# Patient Record
Sex: Male | Born: 1980 | Race: Black or African American | Hispanic: No | Marital: Married | Smoking: Never smoker
Health system: Southern US, Community
[De-identification: ages and names within clinical notes are randomized; demographics above are authoritative.]

## PROBLEM LIST (undated history)

## (undated) DIAGNOSIS — K759 Inflammatory liver disease, unspecified: Secondary | ICD-10-CM

## (undated) DIAGNOSIS — K589 Irritable bowel syndrome without diarrhea: Secondary | ICD-10-CM

## (undated) DIAGNOSIS — K219 Gastro-esophageal reflux disease without esophagitis: Secondary | ICD-10-CM

## (undated) DIAGNOSIS — M722 Plantar fascial fibromatosis: Secondary | ICD-10-CM

## (undated) DIAGNOSIS — I890 Lymphedema, not elsewhere classified: Secondary | ICD-10-CM

## (undated) DIAGNOSIS — A159 Respiratory tuberculosis unspecified: Secondary | ICD-10-CM

## (undated) DIAGNOSIS — F419 Anxiety disorder, unspecified: Secondary | ICD-10-CM

## (undated) DIAGNOSIS — N529 Male erectile dysfunction, unspecified: Secondary | ICD-10-CM

## (undated) DIAGNOSIS — B353 Tinea pedis: Secondary | ICD-10-CM

## (undated) DIAGNOSIS — F459 Somatoform disorder, unspecified: Secondary | ICD-10-CM

## (undated) DIAGNOSIS — R51 Headache: Secondary | ICD-10-CM

## (undated) HISTORY — DX: Lymphedema, not elsewhere classified: I89.0

## (undated) HISTORY — DX: Gastro-esophageal reflux disease without esophagitis: K21.9

## (undated) HISTORY — PX: OTHER SURGICAL HISTORY: SHX169

## (undated) HISTORY — DX: Plantar fascial fibromatosis: M72.2

## (undated) HISTORY — PX: TONSILLECTOMY: SUR1361

## (undated) HISTORY — DX: Anxiety disorder, unspecified: F41.9

## (undated) HISTORY — DX: Headache: R51

## (undated) HISTORY — DX: Irritable bowel syndrome, unspecified: K58.9

## (undated) HISTORY — DX: Respiratory tuberculosis unspecified: A15.9

## (undated) HISTORY — DX: Inflammatory liver disease, unspecified: K75.9

## (undated) HISTORY — DX: Male erectile dysfunction, unspecified: N52.9

## (undated) HISTORY — DX: Tinea pedis: B35.3

## (undated) HISTORY — DX: Somatoform disorder, unspecified: F45.9

---
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
## 2000-04-17 ENCOUNTER — Ambulatory Visit (HOSPITAL_COMMUNITY): Admission: RE | Admit: 2000-04-17 | Discharge: 2000-04-17 | Payer: Self-pay | Admitting: Internal Medicine

## 2000-04-18 ENCOUNTER — Inpatient Hospital Stay (HOSPITAL_COMMUNITY): Admission: AD | Admit: 2000-04-18 | Discharge: 2000-04-22 | Payer: Self-pay | Admitting: Internal Medicine

## 2000-04-18 ENCOUNTER — Encounter: Payer: Self-pay | Admitting: Internal Medicine

## 2000-04-21 ENCOUNTER — Encounter (INDEPENDENT_AMBULATORY_CARE_PROVIDER_SITE_OTHER): Payer: Self-pay | Admitting: *Deleted

## 2002-11-19 ENCOUNTER — Encounter: Admission: RE | Admit: 2002-11-19 | Discharge: 2002-11-19 | Payer: Self-pay | Admitting: Internal Medicine

## 2002-11-19 ENCOUNTER — Encounter: Payer: Self-pay | Admitting: Internal Medicine

## 2006-09-13 ENCOUNTER — Emergency Department (HOSPITAL_COMMUNITY): Admission: EM | Admit: 2006-09-13 | Discharge: 2006-09-13 | Payer: Self-pay | Admitting: Emergency Medicine

## 2007-05-22 ENCOUNTER — Ambulatory Visit: Payer: Self-pay | Admitting: Internal Medicine

## 2007-05-23 ENCOUNTER — Emergency Department (HOSPITAL_COMMUNITY): Admission: EM | Admit: 2007-05-23 | Discharge: 2007-05-23 | Payer: Self-pay | Admitting: Emergency Medicine

## 2007-09-07 ENCOUNTER — Encounter: Payer: Self-pay | Admitting: *Deleted

## 2007-09-07 DIAGNOSIS — B353 Tinea pedis: Secondary | ICD-10-CM | POA: Insufficient documentation

## 2007-09-07 DIAGNOSIS — M722 Plantar fascial fibromatosis: Secondary | ICD-10-CM | POA: Insufficient documentation

## 2007-09-07 DIAGNOSIS — K219 Gastro-esophageal reflux disease without esophagitis: Secondary | ICD-10-CM

## 2010-02-12 ENCOUNTER — Ambulatory Visit: Payer: Self-pay | Admitting: Internal Medicine

## 2010-02-12 DIAGNOSIS — J209 Acute bronchitis, unspecified: Secondary | ICD-10-CM | POA: Insufficient documentation

## 2010-02-14 LAB — CONVERTED CEMR LAB
Albumin: 4.1 g/dL (ref 3.5–5.2)
Basophils Absolute: 0.1 10*3/uL (ref 0.0–0.1)
Basophils Relative: 0.7 % (ref 0.0–3.0)
CO2: 29 meq/L (ref 19–32)
Chloride: 108 meq/L (ref 96–112)
Eosinophils Absolute: 0.5 10*3/uL (ref 0.0–0.7)
Hemoglobin: 16.1 g/dL (ref 13.0–17.0)
Leukocytes, UA: NEGATIVE
MCHC: 35.2 g/dL (ref 30.0–36.0)
MCV: 79.9 fL (ref 78.0–100.0)
Monocytes Absolute: 1.1 10*3/uL — ABNORMAL HIGH (ref 0.1–1.0)
Neutro Abs: 4.9 10*3/uL (ref 1.4–7.7)
Neutrophils Relative %: 56.7 % (ref 43.0–77.0)
Nitrite: NEGATIVE
Potassium: 3.7 meq/L (ref 3.5–5.1)
RBC: 5.74 M/uL (ref 4.22–5.81)
RDW: 14.8 % — ABNORMAL HIGH (ref 11.5–14.6)
Sed Rate: 9 mm/hr (ref 0–22)
Specific Gravity, Urine: 1.02 (ref 1.000–1.030)
TSH: 1.33 microintl units/mL (ref 0.35–5.50)
Total Protein: 7.5 g/dL (ref 6.0–8.3)
Urobilinogen, UA: 0.2 (ref 0.0–1.0)
pH: 6.5 (ref 5.0–8.0)

## 2010-03-05 ENCOUNTER — Encounter: Payer: Self-pay | Admitting: Internal Medicine

## 2010-03-05 ENCOUNTER — Ambulatory Visit: Payer: Self-pay | Admitting: Internal Medicine

## 2010-03-05 ENCOUNTER — Telehealth: Payer: Self-pay | Admitting: Internal Medicine

## 2010-03-05 ENCOUNTER — Encounter (INDEPENDENT_AMBULATORY_CARE_PROVIDER_SITE_OTHER): Payer: Self-pay | Admitting: *Deleted

## 2010-03-05 DIAGNOSIS — K921 Melena: Secondary | ICD-10-CM

## 2010-03-05 LAB — CONVERTED CEMR LAB
Albumin: 4.4 g/dL (ref 3.5–5.2)
Amylase: 66 units/L (ref 27–131)
BUN: 11 mg/dL (ref 6–23)
Basophils Relative: 1.4 % (ref 0.0–3.0)
Bilirubin, Direct: 0.1 mg/dL (ref 0.0–0.3)
CO2: 24 meq/L (ref 19–32)
Chloride: 106 meq/L (ref 96–112)
Cholesterol: 199 mg/dL (ref 0–200)
Creatinine, Ser: 0.9 mg/dL (ref 0.4–1.5)
Eosinophils Absolute: 0.2 10*3/uL (ref 0.0–0.7)
Glucose, Bld: 84 mg/dL (ref 70–99)
Ketones, ur: 40 mg/dL
Leukocytes, UA: NEGATIVE
Lipase: 2 units/L — ABNORMAL LOW (ref 11.0–59.0)
MCHC: 34.7 g/dL (ref 30.0–36.0)
MCV: 80.1 fL (ref 78.0–100.0)
Monocytes Absolute: 0.5 10*3/uL (ref 0.1–1.0)
Neutrophils Relative %: 61.2 % (ref 43.0–77.0)
RBC: 5.82 M/uL — ABNORMAL HIGH (ref 4.22–5.81)
RDW: 14.1 % (ref 11.5–14.6)
Total CHOL/HDL Ratio: 5
Total Protein: 7.1 g/dL (ref 6.0–8.3)
Triglycerides: 68 mg/dL (ref 0.0–149.0)
Urobilinogen, UA: 0.2 (ref 0.0–1.0)

## 2010-03-06 ENCOUNTER — Telehealth: Payer: Self-pay | Admitting: Gastroenterology

## 2010-03-07 ENCOUNTER — Ambulatory Visit: Payer: Self-pay | Admitting: Internal Medicine

## 2010-03-07 DIAGNOSIS — R635 Abnormal weight gain: Secondary | ICD-10-CM | POA: Insufficient documentation

## 2010-03-07 DIAGNOSIS — R12 Heartburn: Secondary | ICD-10-CM

## 2010-03-07 DIAGNOSIS — R634 Abnormal weight loss: Secondary | ICD-10-CM

## 2010-03-08 ENCOUNTER — Ambulatory Visit: Payer: Self-pay | Admitting: Internal Medicine

## 2010-03-09 ENCOUNTER — Ambulatory Visit (HOSPITAL_COMMUNITY): Admission: RE | Admit: 2010-03-09 | Discharge: 2010-03-09 | Payer: Self-pay | Admitting: Internal Medicine

## 2010-03-09 ENCOUNTER — Telehealth: Payer: Self-pay | Admitting: Internal Medicine

## 2010-03-10 ENCOUNTER — Encounter: Payer: Self-pay | Admitting: Internal Medicine

## 2010-03-13 ENCOUNTER — Ambulatory Visit: Payer: Self-pay | Admitting: Internal Medicine

## 2010-03-13 LAB — CONVERTED CEMR LAB
AST: 35 units/L (ref 0–37)
Albumin: 4.1 g/dL (ref 3.5–5.2)
Alkaline Phosphatase: 92 units/L (ref 39–117)
CO2: 31 meq/L (ref 19–32)
Glucose, Bld: 99 mg/dL (ref 70–99)
Hemoglobin, Urine: NEGATIVE
Ketones, ur: NEGATIVE mg/dL
Potassium: 4.2 meq/L (ref 3.5–5.1)
Sodium: 142 meq/L (ref 135–145)
Specific Gravity, Urine: >=1.03 (ref 1.000–1.030)
Total CK: 131 units/L (ref 7–232)
Total Protein: 7 g/dL (ref 6.0–8.3)
Urine Glucose: NEGATIVE mg/dL
Urobilinogen, UA: 0.2 (ref 0.0–1.0)

## 2010-03-14 ENCOUNTER — Encounter: Payer: Self-pay | Admitting: Internal Medicine

## 2010-03-16 ENCOUNTER — Telehealth: Payer: Self-pay | Admitting: Internal Medicine

## 2010-03-19 ENCOUNTER — Telehealth: Payer: Self-pay | Admitting: Internal Medicine

## 2010-03-19 ENCOUNTER — Encounter (INDEPENDENT_AMBULATORY_CARE_PROVIDER_SITE_OTHER): Payer: Self-pay | Admitting: *Deleted

## 2010-03-19 LAB — CONVERTED CEMR LAB
Hep B Core Total Ab: POSITIVE — AB
Hep B S Ab: POSITIVE — AB
Hepatitis B Surface Ag: NEGATIVE

## 2010-03-21 DIAGNOSIS — K2980 Duodenitis without bleeding: Secondary | ICD-10-CM | POA: Insufficient documentation

## 2010-03-26 ENCOUNTER — Ambulatory Visit: Payer: Self-pay | Admitting: Internal Medicine

## 2010-03-26 LAB — CONVERTED CEMR LAB: IgG (Immunoglobin G), Serum: 1120 mg/dL (ref 694–1618)

## 2010-03-27 ENCOUNTER — Ambulatory Visit: Payer: Self-pay | Admitting: Internal Medicine

## 2010-03-30 ENCOUNTER — Encounter: Payer: Self-pay | Admitting: Internal Medicine

## 2010-04-17 ENCOUNTER — Telehealth: Payer: Self-pay | Admitting: Internal Medicine

## 2010-04-27 ENCOUNTER — Ambulatory Visit: Payer: Self-pay | Admitting: Internal Medicine

## 2010-04-27 DIAGNOSIS — R110 Nausea: Secondary | ICD-10-CM

## 2010-05-02 ENCOUNTER — Ambulatory Visit: Payer: Self-pay | Admitting: Infectious Diseases

## 2010-05-02 DIAGNOSIS — R61 Generalized hyperhidrosis: Secondary | ICD-10-CM

## 2010-05-03 ENCOUNTER — Encounter: Payer: Self-pay | Admitting: Infectious Diseases

## 2010-05-07 ENCOUNTER — Encounter: Payer: Self-pay | Admitting: Infectious Diseases

## 2010-05-07 ENCOUNTER — Ambulatory Visit: Payer: Self-pay | Admitting: Internal Medicine

## 2010-05-07 ENCOUNTER — Telehealth (INDEPENDENT_AMBULATORY_CARE_PROVIDER_SITE_OTHER): Payer: Self-pay | Admitting: *Deleted

## 2010-05-07 ENCOUNTER — Telehealth: Payer: Self-pay | Admitting: Internal Medicine

## 2010-05-07 DIAGNOSIS — R112 Nausea with vomiting, unspecified: Secondary | ICD-10-CM

## 2010-05-07 LAB — CONVERTED CEMR LAB: Anti Nuclear Antibody(ANA): NEGATIVE

## 2010-05-08 ENCOUNTER — Ambulatory Visit: Payer: Self-pay | Admitting: Infectious Diseases

## 2010-05-08 LAB — CONVERTED CEMR LAB
ALT: 41 units/L (ref 0–53)
AST: 32 units/L (ref 0–37)
Albumin: 4.2 g/dL (ref 3.5–5.2)
BUN: 10 mg/dL (ref 6–23)
Calcium: 9.1 mg/dL (ref 8.4–10.5)
Chloride: 105 meq/L (ref 96–112)
Eosinophils Absolute: 0.3 10*3/uL (ref 0.0–0.7)
HCT: 44.2 % (ref 39.0–52.0)
Lymphs Abs: 2 10*3/uL (ref 0.7–4.0)
MCHC: 33.9 g/dL (ref 30.0–36.0)
MCV: 81.3 fL (ref 78.0–100.0)
Monocytes Absolute: 0.6 10*3/uL (ref 0.1–1.0)
Neutrophils Relative %: 60.3 % (ref 43.0–77.0)
Platelets: 207 10*3/uL (ref 150.0–400.0)
Potassium: 3.5 meq/L (ref 3.5–5.1)
RDW: 14.3 % (ref 11.5–14.6)
Sed Rate: 10 mm/hr (ref 0–22)
Sodium: 140 meq/L (ref 135–145)
Total Protein: 7.3 g/dL (ref 6.0–8.3)

## 2010-05-15 ENCOUNTER — Ambulatory Visit (HOSPITAL_COMMUNITY): Admission: RE | Admit: 2010-05-15 | Discharge: 2010-05-15 | Payer: Self-pay | Admitting: Infectious Diseases

## 2010-05-22 ENCOUNTER — Telehealth: Payer: Self-pay | Admitting: Internal Medicine

## 2010-05-24 ENCOUNTER — Ambulatory Visit: Payer: Self-pay | Admitting: Internal Medicine

## 2010-05-29 ENCOUNTER — Ambulatory Visit: Payer: Self-pay | Admitting: Infectious Diseases

## 2010-06-06 ENCOUNTER — Ambulatory Visit (HOSPITAL_COMMUNITY): Admission: RE | Admit: 2010-06-06 | Discharge: 2010-06-06 | Payer: Self-pay | Admitting: Infectious Diseases

## 2010-06-07 ENCOUNTER — Telehealth: Payer: Self-pay | Admitting: Internal Medicine

## 2010-06-11 ENCOUNTER — Ambulatory Visit: Payer: Self-pay | Admitting: Infectious Diseases

## 2010-06-11 DIAGNOSIS — A150 Tuberculosis of lung: Secondary | ICD-10-CM | POA: Insufficient documentation

## 2010-06-12 ENCOUNTER — Telehealth: Payer: Self-pay | Admitting: Internal Medicine

## 2010-06-15 ENCOUNTER — Ambulatory Visit: Payer: Self-pay | Admitting: Internal Medicine

## 2010-06-15 DIAGNOSIS — G4700 Insomnia, unspecified: Secondary | ICD-10-CM | POA: Insufficient documentation

## 2010-06-26 ENCOUNTER — Ambulatory Visit: Payer: Self-pay | Admitting: Infectious Disease

## 2010-06-26 ENCOUNTER — Encounter: Payer: Self-pay | Admitting: Internal Medicine

## 2010-06-26 DIAGNOSIS — I890 Lymphedema, not elsewhere classified: Secondary | ICD-10-CM | POA: Insufficient documentation

## 2010-06-27 ENCOUNTER — Encounter: Payer: Self-pay | Admitting: Internal Medicine

## 2010-06-27 LAB — CONVERTED CEMR LAB
ALT: 27 units/L (ref 0–53)
AST: 24 units/L (ref 0–37)
Alkaline Phosphatase: 94 units/L (ref 39–117)
BUN: 7 mg/dL (ref 6–23)
Basophils Absolute: 0.1 10*3/uL (ref 0.0–0.1)
Basophils Relative: 1 % (ref 0–1)
Calcium: 8.9 mg/dL (ref 8.4–10.5)
Chloride: 105 meq/L (ref 96–112)
Creatinine, Ser: 0.92 mg/dL (ref 0.40–1.50)
Eosinophils Relative: 4 % (ref 0–5)
Lymphocytes Relative: 33 % (ref 12–46)
MCHC: 33.6 g/dL (ref 30.0–36.0)
Monocytes Absolute: 0.7 10*3/uL (ref 0.1–1.0)
Neutro Abs: 4.1 10*3/uL (ref 1.7–7.7)
Platelets: 180 10*3/uL (ref 150–400)
Potassium: 3.8 meq/L (ref 3.5–5.3)
RDW: 14.4 % (ref 11.5–15.5)
Sed Rate: 5 mm/hr (ref 0–16)

## 2010-06-28 ENCOUNTER — Ambulatory Visit (HOSPITAL_COMMUNITY): Admission: RE | Admit: 2010-06-28 | Discharge: 2010-06-28 | Payer: Self-pay | Admitting: Infectious Disease

## 2010-07-02 ENCOUNTER — Telehealth: Payer: Self-pay | Admitting: Infectious Disease

## 2010-07-06 ENCOUNTER — Ambulatory Visit: Payer: Self-pay | Admitting: Internal Medicine

## 2010-07-06 DIAGNOSIS — J069 Acute upper respiratory infection, unspecified: Secondary | ICD-10-CM | POA: Insufficient documentation

## 2010-07-11 ENCOUNTER — Ambulatory Visit: Payer: Self-pay | Admitting: Infectious Disease

## 2010-07-11 ENCOUNTER — Encounter: Payer: Self-pay | Admitting: Internal Medicine

## 2010-07-11 DIAGNOSIS — M545 Low back pain: Secondary | ICD-10-CM

## 2010-07-11 LAB — CONVERTED CEMR LAB
AST: 26 units/L (ref 0–37)
Albumin: 4.2 g/dL (ref 3.5–5.2)
BUN: 9 mg/dL (ref 6–23)
CRP: 0.1 mg/dL (ref <0.6)
Calcium: 9.4 mg/dL (ref 8.4–10.5)
Chloride: 107 meq/L (ref 96–112)
Creatinine, Ser: 0.9 mg/dL (ref 0.40–1.50)
Glucose, Bld: 98 mg/dL (ref 70–99)
LDH: 156 units/L (ref 94–250)
Potassium: 4 meq/L (ref 3.5–5.3)
Sed Rate: 2 mm/hr (ref 0–16)
Total CK: 98 units/L (ref 7–232)

## 2010-07-12 ENCOUNTER — Telehealth: Payer: Self-pay | Admitting: Infectious Disease

## 2010-07-17 ENCOUNTER — Telehealth: Payer: Self-pay | Admitting: Infectious Disease

## 2010-07-19 ENCOUNTER — Ambulatory Visit (HOSPITAL_COMMUNITY): Admission: RE | Admit: 2010-07-19 | Discharge: 2010-07-19 | Payer: Self-pay | Admitting: Infectious Disease

## 2010-07-22 DIAGNOSIS — R599 Enlarged lymph nodes, unspecified: Secondary | ICD-10-CM | POA: Insufficient documentation

## 2010-08-09 ENCOUNTER — Ambulatory Visit: Payer: Self-pay | Admitting: Internal Medicine

## 2010-08-09 DIAGNOSIS — F419 Anxiety disorder, unspecified: Secondary | ICD-10-CM | POA: Insufficient documentation

## 2010-08-09 DIAGNOSIS — K5909 Other constipation: Secondary | ICD-10-CM

## 2010-08-09 DIAGNOSIS — F411 Generalized anxiety disorder: Secondary | ICD-10-CM | POA: Insufficient documentation

## 2010-08-09 DIAGNOSIS — F450 Somatization disorder: Secondary | ICD-10-CM | POA: Insufficient documentation

## 2010-08-09 LAB — CONVERTED CEMR LAB
Bilirubin Urine: NEGATIVE
Hemoglobin, Urine: NEGATIVE
Ketones, ur: NEGATIVE mg/dL
Nitrite: NEGATIVE
Total Protein, Urine: NEGATIVE mg/dL
Urine Glucose: NEGATIVE mg/dL
pH: 6 (ref 5.0–8.0)

## 2010-08-13 ENCOUNTER — Ambulatory Visit: Payer: Self-pay | Admitting: Infectious Disease

## 2010-08-13 ENCOUNTER — Ambulatory Visit (HOSPITAL_COMMUNITY): Admission: RE | Admit: 2010-08-13 | Discharge: 2010-08-13 | Payer: Self-pay | Admitting: Infectious Disease

## 2010-08-13 ENCOUNTER — Encounter: Payer: Self-pay | Admitting: Internal Medicine

## 2010-08-13 DIAGNOSIS — D1739 Benign lipomatous neoplasm of skin and subcutaneous tissue of other sites: Secondary | ICD-10-CM | POA: Insufficient documentation

## 2010-08-15 ENCOUNTER — Telehealth: Payer: Self-pay | Admitting: Internal Medicine

## 2010-08-15 ENCOUNTER — Encounter (INDEPENDENT_AMBULATORY_CARE_PROVIDER_SITE_OTHER): Payer: Self-pay | Admitting: *Deleted

## 2010-08-15 ENCOUNTER — Emergency Department (HOSPITAL_COMMUNITY): Admission: EM | Admit: 2010-08-15 | Discharge: 2010-08-15 | Payer: Self-pay | Admitting: Emergency Medicine

## 2010-08-16 ENCOUNTER — Ambulatory Visit: Payer: Self-pay | Admitting: Internal Medicine

## 2010-08-16 LAB — CONVERTED CEMR LAB
Cortisol, Plasma: 15.2 ug/dL
Cortisol, Plasma: 21.6 ug/dL

## 2010-08-17 ENCOUNTER — Encounter: Payer: Self-pay | Admitting: Infectious Diseases

## 2010-08-20 ENCOUNTER — Encounter: Payer: Self-pay | Admitting: Infectious Disease

## 2010-08-22 ENCOUNTER — Encounter: Payer: Self-pay | Admitting: Infectious Disease

## 2010-08-23 ENCOUNTER — Encounter: Payer: Self-pay | Admitting: Infectious Disease

## 2010-08-23 ENCOUNTER — Ambulatory Visit (HOSPITAL_COMMUNITY): Admission: RE | Admit: 2010-08-23 | Discharge: 2010-08-23 | Payer: Self-pay | Admitting: Infectious Disease

## 2010-08-27 ENCOUNTER — Telehealth (INDEPENDENT_AMBULATORY_CARE_PROVIDER_SITE_OTHER): Payer: Self-pay | Admitting: *Deleted

## 2010-09-04 ENCOUNTER — Ambulatory Visit: Payer: Self-pay | Admitting: Infectious Disease

## 2010-09-10 ENCOUNTER — Ambulatory Visit: Payer: Self-pay | Admitting: Infectious Disease

## 2010-09-10 ENCOUNTER — Other Ambulatory Visit: Admission: RE | Admit: 2010-09-10 | Discharge: 2010-09-10 | Payer: Self-pay | Admitting: Infectious Disease

## 2010-09-11 ENCOUNTER — Encounter: Payer: Self-pay | Admitting: Internal Medicine

## 2010-09-11 ENCOUNTER — Encounter: Payer: Self-pay | Admitting: Infectious Disease

## 2010-09-17 ENCOUNTER — Encounter: Payer: Self-pay | Admitting: Infectious Diseases

## 2010-09-25 ENCOUNTER — Ambulatory Visit: Payer: Self-pay | Admitting: Internal Medicine

## 2010-09-26 ENCOUNTER — Emergency Department (HOSPITAL_COMMUNITY): Admission: EM | Admit: 2010-09-26 | Discharge: 2010-09-26 | Payer: Self-pay | Admitting: Emergency Medicine

## 2010-09-26 ENCOUNTER — Encounter (INDEPENDENT_AMBULATORY_CARE_PROVIDER_SITE_OTHER): Payer: Self-pay | Admitting: *Deleted

## 2010-09-27 ENCOUNTER — Ambulatory Visit: Payer: Self-pay | Admitting: Internal Medicine

## 2010-09-27 LAB — CONVERTED CEMR LAB
CO2: 26 meq/L (ref 19–32)
Chloride: 104 meq/L (ref 96–112)
Cortisol, Plasma: 9.3 ug/dL
Creatinine, Ser: 1 mg/dL (ref 0.4–1.5)
Sodium: 141 meq/L (ref 135–145)

## 2010-09-28 ENCOUNTER — Telehealth: Payer: Self-pay | Admitting: Internal Medicine

## 2010-09-28 ENCOUNTER — Ambulatory Visit: Payer: Self-pay | Admitting: Internal Medicine

## 2010-09-28 ENCOUNTER — Emergency Department (HOSPITAL_COMMUNITY): Admission: EM | Admit: 2010-09-28 | Discharge: 2010-09-28 | Payer: Self-pay | Admitting: Emergency Medicine

## 2010-09-28 ENCOUNTER — Encounter (INDEPENDENT_AMBULATORY_CARE_PROVIDER_SITE_OTHER): Payer: Self-pay | Admitting: *Deleted

## 2010-09-28 DIAGNOSIS — R519 Headache, unspecified: Secondary | ICD-10-CM | POA: Insufficient documentation

## 2010-09-28 DIAGNOSIS — R51 Headache: Secondary | ICD-10-CM

## 2010-10-01 ENCOUNTER — Telehealth: Payer: Self-pay | Admitting: Internal Medicine

## 2010-10-11 ENCOUNTER — Ambulatory Visit: Payer: Self-pay | Admitting: Internal Medicine

## 2010-10-12 ENCOUNTER — Ambulatory Visit: Payer: Self-pay | Admitting: Internal Medicine

## 2010-10-12 DIAGNOSIS — J320 Chronic maxillary sinusitis: Secondary | ICD-10-CM

## 2010-10-16 ENCOUNTER — Ambulatory Visit: Payer: Self-pay | Admitting: Infectious Disease

## 2010-10-16 DIAGNOSIS — Z8619 Personal history of other infectious and parasitic diseases: Secondary | ICD-10-CM

## 2010-10-16 DIAGNOSIS — D869 Sarcoidosis, unspecified: Secondary | ICD-10-CM

## 2010-10-16 DIAGNOSIS — B181 Chronic viral hepatitis B without delta-agent: Secondary | ICD-10-CM | POA: Insufficient documentation

## 2010-10-16 DIAGNOSIS — B182 Chronic viral hepatitis C: Secondary | ICD-10-CM | POA: Insufficient documentation

## 2010-10-24 ENCOUNTER — Telehealth: Payer: Self-pay | Admitting: Internal Medicine

## 2010-11-07 ENCOUNTER — Ambulatory Visit: Admit: 2010-11-07 | Payer: Self-pay | Admitting: Internal Medicine

## 2010-11-09 ENCOUNTER — Ambulatory Visit (HOSPITAL_COMMUNITY)
Admission: RE | Admit: 2010-11-09 | Discharge: 2010-11-09 | Payer: Self-pay | Source: Home / Self Care | Attending: Internal Medicine | Admitting: Internal Medicine

## 2010-11-11 HISTORY — PX: MASS EXCISION: SHX2000

## 2010-11-28 ENCOUNTER — Ambulatory Visit
Admission: RE | Admit: 2010-11-28 | Discharge: 2010-11-28 | Payer: Self-pay | Source: Home / Self Care | Attending: Infectious Disease | Admitting: Infectious Disease

## 2010-11-30 ENCOUNTER — Ambulatory Visit
Admission: RE | Admit: 2010-11-30 | Discharge: 2010-11-30 | Payer: Self-pay | Source: Home / Self Care | Attending: Internal Medicine | Admitting: Internal Medicine

## 2010-12-04 ENCOUNTER — Telehealth (INDEPENDENT_AMBULATORY_CARE_PROVIDER_SITE_OTHER): Payer: Self-pay | Admitting: *Deleted

## 2010-12-10 ENCOUNTER — Encounter: Payer: Self-pay | Admitting: Internal Medicine

## 2010-12-11 NOTE — Letter (Signed)
Summary: [facility] Instructions  [facility]  [address] [place], [place] [address]   Phone: [phone]  Fax: [phone]       RONDO SPITTLER    [birth year]    MRN: [id number]       Procedure Day /Date: 03/27/10 Tuesday     Arrival Time: 1:30 pm     Procedure Time: 2:30 pm     Location of Procedure:                    _ x_  [facility] (4th Floor)  PREPARATION FOR COLONOSCOPY WITH MIRALAX  ___________________________________________________________________________________________________   THE DAY BEFORE YOUR PROCEDURE         DATE: 03/26/10 DAY: Monday  1   Drink clear liquids the entire day-NO SOLID FOOD  2   Do not drink anything colored red or purple.  Avoid juices with pulp.  No orange juice.  3   Drink at least 64 oz. (8 glasses) of fluid/clear liquids during the day to prevent dehydration and help the prep work efficiently.  CLEAR LIQUIDS INCLUDE: Water Jello Ice Popsicles Tea (sugar ok, no milk/cream) Powdered fruit flavored drinks Coffee (sugar ok, no milk/cream) Gatorade Juice: apple, white grape, white cranberry  Lemonade Clear bullion, consomm, broth Carbonated beverages (any kind) Strained chicken noodle soup Hard Candy  4   Mix the entire bottle of Miralax with 64 oz. of Gatorade/Powerade in the morning and put in the refrigerator to chill.  5   At 3:00 pm take 2 Dulcolax/Bisacodyl tablets.  6   At 4:30 pm take one Reglan/Metoclopramide tablet.  7  Starting at 5:00 pm drink one 8 oz glass of the Miralax mixture every 15-20 minutes until you have finished drinking the entire 64 oz.  You should finish drinking prep around 7:30 or 8:00 pm.  8   If you are nauseated, you may take the 2nd Reglan/Metoclopramide tablet at 6:30 pm.        9    At 8:00 pm take 2 more DULCOLAX/Bisacodyl tablets.     THE DAY OF YOUR PROCEDURE      DATE:  03/27/10 DAY: Tuesday  You may drink clear liquids until 12:30 pm  (2 HOURS BEFORE  PROCEDURE).   MEDICATION INSTRUCTIONS  Unless otherwise instructed, you should take regular prescription medications with a small sip of water as early as possible the morning of your procedure.        OTHER INSTRUCTIONS  You will need a responsible adult at least 32 years of age to accompany you and drive you home.   This person must remain in the waiting room during your procedure.  Wear loose fitting clothing that is easily removed.  Leave jewelry and other valuables at home.  However, you may wish to bring a book to read or an iPod/MP3 player to listen to music as you wait for your procedure to start.  Remove all body piercing jewelry and leave at home.  Total time from sign-in until discharge is approximately 2-3 hours.  You should go home directly after your procedure and rest.  You can resume normal activities the day after your procedure.  The day of your procedure you should not:   Drive   Make legal decisions   Operate machinery   Drink alcohol   Return to work  You will receive specific instructions about eating, activities and medications before you leave.   The above instructions have been reviewed and  explained to me by   Lamona Curl CMA Duncan Dull)  Mar 26, 2010 11:56 AM     I fully understand and can verbalize these instructions _____________________________ Date 03/26/10

## 2010-12-11 NOTE — Assessment & Plan Note (Signed)
Summary: abd [pain/plot/cd   Vital Signs:  Patient profile:   30 year old male Height:      71 inches Weight:      197.25 pounds O2 Sat:      99 % on Room air Temp:     97.9 degrees F oral Pulse rate:   61 / minute Pulse rhythm:   regular Resp:     16 per minute BP sitting:   128 / 80  (left arm) Cuff size:   large  Vitals Entered By: Rock Nephew CMA (September 25, 2010 8:43 AM)  O2 Flow:  Room air CC: Pt c/o abdominal/ bilateral side pain w/ nausea and lack of appetite, Abdominal pain, Abdominal Pain Is Patient Diabetic? No Pain Assessment Patient in pain? no       Does patient need assistance? Functional Status Self care Ambulation Normal   Primary Care Provider:  Georgina Quint Plotnikov MD  CC:  Pt c/o abdominal/ bilateral side pain w/ nausea and lack of appetite, Abdominal pain, and Abdominal Pain.  History of Present Illness:  Abdominal Pain      This is a 30 year old man who presents with Abdominal pain.  The symptoms began >1 year ago.  On a scale of 1 to 10, the intensity is described as a 2.  The patient reports nausea and anorexia, but denies vomiting, diarrhea, constipation, melena, hematochezia, and hematemesis.  The location of the pain is diffuse.  The pain is described as intermittent and dull.  The patient denies the following symptoms: fever, weight loss, dysuria, chest pain, jaundice, and dark urine.  The pain is worse with food.  I reviewed his chart and I see extensive data that includes CT scans, labs, ID and GI consults, and meds tried - this all looks normal.  Dyspepsia History:      He has no alarm features of dyspepsia including no history of melena, hematochezia, dysphagia, persistent vomiting, or involuntary weight loss > 5%.  There is a prior history of GERD.  The patient does not have a prior history of documented ulcer disease.  The dominant symptom is heartburn or acid reflux.  An H-2 blocker medication is not currently being taken.  A prior EGD  has been done.     Preventive Screening-Counseling & Management  Alcohol-Tobacco     Alcohol drinks/day: 0     Alcohol Counseling: not indicated; patient does not drink     Smoking Status: never     Tobacco Counseling: not indicated; no tobacco use  Hep-HIV-STD-Contraception     Hepatitis Risk: no risk noted     HIV Risk: no risk noted     STD Risk: no risk noted     TSE monthly: yes     Testicular SE Education/Counseling to perform regular STE      Sexual History:  currently monogamous.        Drug Use:  never and no.        Blood Transfusions:  no.    Clinical Review Panels:  Prevention   Last Colonoscopy:  DONE (03/27/2010)  Immunizations   Last Flu Vaccine:  Fluvax 3+ (08/09/2010)  Lipid Management   Cholesterol:  199 (03/05/2010)   LDL (bad choesterol):  146 (03/05/2010)   HDL (good cholesterol):  39.50 (03/05/2010)  Diabetes Management   Creatinine:  0.90 (07/11/2010)   Last Flu Vaccine:  Fluvax 3+ (08/09/2010)  CBC   WBC:  7.6 (06/26/2010)   RBC:  5.23 (06/26/2010)  Hgb:  13.8 (06/26/2010)   Hct:  41.1 (06/26/2010)   Platelets:  180 (06/26/2010)   MCV  78.6 (06/26/2010)   MCHC  33.6 (06/26/2010)   RDW  14.4 (06/26/2010)   PMN:  54 (06/26/2010)   Lymphs:  33 (06/26/2010)   Monos:  9 (06/26/2010)   Eosinophils:  4 (06/26/2010)   Basophil:  1 (06/26/2010)  Complete Metabolic Panel   Glucose:  98 (07/11/2010)   Sodium:  142 (07/11/2010)   Potassium:  4.0 (07/11/2010)   Chloride:  107 (07/11/2010)   CO2:  25 (07/11/2010)   BUN:  9 (07/11/2010)   Creatinine:  0.90 (07/11/2010)   Albumin:  4.2 (07/11/2010)   Total Protein:  6.6 (07/11/2010)   Calcium:  9.4 (07/11/2010)   Total Bili:  0.4 (07/11/2010)   Alk Phos:  94 (07/11/2010)   SGPT (ALT):  32 (07/11/2010)   SGOT (AST):  26 (07/11/2010)   Current Medications (verified): 1)  Vitamin D 1000 Unit Tabs (Cholecalciferol) .Marland Kitchen.. 1 By Mouth Qd 2)  Amitiza 24 Mcg Caps (Lubiprostone) .Marland Kitchen.. 1 By Mouth  Once Daily As Needed Constipation 3)  Zofran Odt 4 Mg Tbdp (Ondansetron) .... Take Every Six Hours As Needed 4)  Milk of Magnesia 400 Mg/53ml Susp (Magnesium Hydroxide) .... Use As Dirrected For Constipation 5)  Tramadol Hcl 50 Mg Tabs (Tramadol Hcl) .... Take 1-2 Tablets Every 6 Hours As Needed For Pain  Allergies (verified): 1)  ! 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) Tuberculosis 1998 [place], rx again in 2001 Hep B SAb/CAb+ DUDODENITIS Lymphedema in the right leg IBS-C Psychosomatic disorder Anxiety  Past Surgical History: Last updated: 03/21/2010 R groin ?TB infection surgery - remote completed in [place] Right foot surgery-completed in [place]  Family History: Last updated: 02/12/2010 Unknown  Social History: Last updated: 03/05/2010 Occupation: [employer] Single Never Smoked Alcohol use-no Drug use-no Regular exercise-yes  Risk Factors: Alcohol Use: 0 (09/25/2010) Caffeine Use: 0 (09/04/2010) Exercise: yes (09/04/2010)  Risk Factors: Smoking Status: never (09/25/2010)  Family History: Reviewed history from 02/12/2010 and no changes required. Unknown  Social History: Reviewed history from 03/05/2010 and no changes required. Occupation: [employer] Single Never Smoked Alcohol use-no Drug use-no Regular exercise-yes  Review of Systems       The patient complains of anorexia, abdominal pain, and severe indigestion/heartburn.  The patient denies fever, weight loss, weight gain, chest pain, syncope, dyspnea on exertion, peripheral edema, prolonged cough, headaches, hemoptysis, melena, hematochezia, hematuria, suspicious skin lesions, difficulty walking, depression, abnormal bleeding, enlarged lymph nodes, angioedema, and testicular masses.   GI:  Complains of abdominal pain, gas, indigestion,  loss of appetite, and nausea; denies bloody stools, change in bowel habits, constipation, dark tarry stools, diarrhea, excessive appetite, hemorrhoids, vomiting, vomiting blood, and yellowish skin color.  Physical Exam  General:  alert, well-developed, well-nourished, well-hydrated, appropriate dress, normal appearance, healthy-appearing, cooperative to examination, and good hygiene.   Head:  normocephalic, atraumatic, no abnormalities observed, and no abnormalities palpated.   Eyes:  no icterus, pink/moist mm., Mouth:  good dentition, no gingival abnormalities, no dental plaque, pharynx pink and moist, no erythema, no posterior lymphoid hypertrophy, no lesions, no tongue abnormalities, no leukoplakia, and no petechiae.   Neck:  supple, full ROM, no masses, no thyromegaly, no thyroid nodules or tenderness, no JVD, normal carotid upstroke, and no cervical lymphadenopathy.   Lungs:  Normal respiratory  effort, chest expands symmetrically. Lungs are clear to auscultation, no crackles or wheezes. Heart:  Normal rate and regular rhythm. S1 and S2 normal without gallop, murmur, click, rub or other extra sounds. Abdomen:  soft, non-tender, normal bowel sounds, no distention, no masses, no guarding, no rigidity, no rebound tenderness, no abdominal hernia, no inguinal hernia, no hepatomegaly, and no splenomegaly.   Rectal:  no external abnormalities, no hemorrhoids, normal sphincter tone, no masses, no tenderness, no fissures, no fistulae, no perianal rash, and stool faintly positive for occult blood.   Genitalia:  circumcised, no hydrocele, no varicocele, no scrotal masses, no testicular masses or atrophy, no cutaneous lesions, and no urethral discharge.   Prostate:  Prostate gland firm and smooth, no enlargement, nodularity, tenderness, mass, asymmetry or induration. Msk:  No deformity or scoliosis noted of thoracic or lumbar spine.   Pulses:  R and L carotid,radial,femoral,dorsalis pedis and posterior  tibial pulses are full and equal bilaterally Extremities:  No clubbing, cyanosis, edema, or deformity noted with normal full range of motion of all joints.   Neurologic:  No cranial nerve deficits noted. Station and gait are normal. Plantar reflexes are down-going bilaterally. DTRs are symmetrical throughout. Sensory, motor and coordinative functions appear intact. Skin:  turgor normal, color normal, no rashes, no suspicious lesions, no ecchymoses, no petechiae, and no purpura.   Cervical Nodes:  No lymphadenopathy noted Axillary Nodes:  no R axillary adenopathy and no L axillary adenopathy.   Inguinal Nodes:  no R inguinal adenopathy and no L inguinal adenopathy.   Psych:  Oriented X3, memory intact for recent and remote, normally interactive, good eye contact, not anxious appearing, not depressed appearing, not agitated, not suicidal, and not homicidal.     Impression & Recommendations:  Problem # 1:  GERD (ICD-530.81) Assessment Deteriorated  His updated medication list for this problem includes:    Dexilant 60 Mg Cpdr (Dexlansoprazole) ..... One by mouth once daily for heartburn  Orders: Hemoccult Guaiac-1 spec.(in office) (98119) Gastroenterology Referral (GI)  EGD: DONE (03/08/2010)  Labs Reviewed: Hgb: 13.8 (06/26/2010)   Hct: 41.1 (06/26/2010)  Problem # 2:  ABDOMINAL PAIN-EPIGASTRIC (ICD-789.06) Assessment: Unchanged  His updated medication list for this problem includes:    Amitiza 24 Mcg Caps (Lubiprostone) .Marland Kitchen... 1 by mouth once daily as needed constipation  Orders: Hemoccult Guaiac-1 spec.(in office) (82270)  Problem # 3:  BLOOD IN STOOL (ICD-578.1) Assessment: Unchanged  Orders: Gastroenterology Referral (GI)  Complete Medication List: 1)  Vitamin D 1000 Unit Tabs (Cholecalciferol) .Marland Kitchen.. 1 by mouth qd 2)  Amitiza 24 Mcg Caps (Lubiprostone) .Marland Kitchen.. 1 by mouth once daily as needed constipation 3)  Zofran Odt 4 Mg Tbdp (Ondansetron) .... Take every six hours as  needed 4)  Milk of Magnesia 400 Mg/51ml Susp (Magnesium hydroxide) .... Use as dirrected for constipation 5)  Tramadol Hcl 50 Mg Tabs (Tramadol hcl) .... Take 1-2 tablets every 6 hours as needed for pain 6)  Dexilant 60 Mg Cpdr (Dexlansoprazole) .... One by mouth once daily for heartburn  Patient Instructions: 1)  Please schedule a follow-up appointment in 1 month. 2)  Avoid foods high in acid (tomatoes, citrus juices, spicy foods). Avoid eating within two hours of lying down or before exercising. Do not over eat; try smaller more frequent meals. Elevate head of bed twelve inches when sleeping. Prescriptions: DEXILANT 60 MG CPDR (DEXLANSOPRAZOLE) one by mouth once daily for heartburn  #50 x 0   Entered and Authorized by:   Etta Grandchild  MD   Signed by:   Etta Grandchild MD on 09/25/2010   Method used:   Samples Given   RxID:   [id number]    Orders Added: 1)  Hemoccult Guaiac-1 spec.(in office) [82270] 2)  Est. Patient Level V [84696] 3)  Gastroenterology Referral [GI]

## 2010-12-11 NOTE — Assessment & Plan Note (Signed)
Summary: STOMACH ISSUES...LSW.   History of Present Illness Visit Type: Follow-up Visit Primary GI MD: Lina Sar MD Primary Provider: Tresa Garter MD Requesting Provider: na Chief Complaint: Generalized abd pain, and nausea History of Present Illness:   This is a 30 year old African American male with chronic intermittent abdominal pain fully evaluated including a negative colonoscopy in May 2011. He had negative biopsies, a normal sedimentation rate and tissue transglutaminase levels. He had duodenitis on an upper endoscopy in April 2011 showing chronic duodenitis and biopsies. He was treated with proton pump inhibitors without any improvement of his pain. A CT Scan of the abdomen in April 2011 was nondiagnostic. He continues to go to the emergency room with abdominal pain and is being told that he is constipated. It is difficult to understand him because English is his second language. His weight  has not changed since the beginning of this pain and he seems to be able to eat although he claims to be vomiting often.   GI Review of Systems    Reports abdominal pain and  nausea.     Location of  Abdominal pain: generalized.    Denies acid reflux, belching, bloating, chest pain, dysphagia with liquids, dysphagia with solids, heartburn, loss of appetite, vomiting, vomiting blood, weight loss, and  weight gain.        Denies anal fissure, black tarry stools, change in bowel habit, constipation, diarrhea, diverticulosis, fecal incontinence, heme positive stool, hemorrhoids, irritable bowel syndrome, jaundice, light color stool, liver problems, rectal bleeding, and  rectal pain.    Current Medications (verified): 1)  Vitamin D 1000 Unit Tabs (Cholecalciferol) .Marland Kitchen.. 1 By Mouth Qd 2)  Amitiza 24 Mcg Caps (Lubiprostone) .Marland Kitchen.. 1 By Mouth Once Daily As Needed Constipation 3)  Zofran Odt 4 Mg Tbdp (Ondansetron) .... Take Every Six Hours As Needed 4)  Milk of Magnesia 400 Mg/78ml Susp (Magnesium  Hydroxide) .... Use As Dirrected For Constipation 5)  Tramadol Hcl 50 Mg Tabs (Tramadol Hcl) .... Take 1-2 Tablets Every 6 Hours As Needed For Pain 6)  Dexilant 60 Mg Cpdr (Dexlansoprazole) .... One By Mouth Once Daily For Heartburn 7)  Cortef 10 Mg Tabs (Hydrocortisone) .Marland Kitchen.. 1 By Mouth in Am and 1 At Lunch 8)  Buspirone Hcl 15 Mg Tabs (Buspirone Hcl) .... Start With 1/2 Tab Two Times A Day Then 1 By Mouth Two Times A Day For Anxiety  Allergies (verified): 1)  ! 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 R groin ?TB infection surgery - remote completed in [place] Right foot surgery-completed in [place]  Family History: Reviewed history from 02/12/2010 and no changes required. Unknown  Social History: Reviewed history from 03/05/2010 and no changes required. Occupation: [employer] Single Never Smoked Alcohol use-no Drug use-no Regular exercise-yes  Review of Systems       The patient complains of headaches-new.  The patient denies allergy/sinus, anemia, anxiety-new, arthritis/joint pain, back pain, blood in urine, breast changes/lumps, change in vision, confusion, cough, coughing up blood, depression-new, fainting, fatigue, fever, hearing problems, heart murmur, heart rhythm changes, itching, muscle pains/cramps, night sweats, nosebleeds, shortness of breath, skin rash, sleeping problems, sore throat, swelling of feet/legs, swollen lymph glands, thirst -  excessive, urination - excessive, urination changes/pain, urine leakage, vision changes, and voice change.         Pertinent positive and negative review of systems were  noted in the above HPI. All other ROS was otherwise negative.   Vital Signs:  Patient profile:   30 year old male Height:      71 inches Weight:      197 pounds BMI:     27.58 BSA:     2.10 Pulse rate:   76 / minute Pulse rhythm:   regular BP sitting:   100 / 60  (left arm) Cuff size:   regular  Vitals Entered By: Ok Anis CMA (October 11, 2010 3:59 PM)  Physical Exam  General:  Well developed, well nourished, no acute distress. Eyes:  PERRLA, no icterus. Mouth:  No deformity or lesions, dentition normal. Neck:  Supple; no masses or thyromegaly. Lungs:  Clear throughout to auscultation. Heart:  Regular rate and rhythm; no murmurs, rubs,  or bruits. Abdomen:  diffusely tender but soft. Normoactive bowel sounds. No focal tenderness. Liver edge at costal margin. No distention or ascites. Extremities:  No clubbing, cyanosis, edema or deformities noted. Skin:  Intact without significant lesions or rashes. Psych:  Alert and cooperative. Normal mood and affect.   Impression & Recommendations:  Problem # 1:  CONSTIPATION, CHRONIC (ICD-564.09) Patient has chronic constipation, possibly causing abdominal pain. Another explanation may be that he has irritable bowel syndrome. I feel that his pain is NOT  organic. I have given him samples of MiraLax to take 17 g twice a day. He wants to come back for a recheck in 4 weeks .In the meantime. he will continue  MiraLax twice a day and  reduce to one a day prn  Problem # 2:  ANXIETY (ICD-300.00) I suspect we are dealing with irritable bowel syndrome. He is on Buspar 15 mg, 1/2 tablet twice a day.  Patient Instructions: 1)  We have given you samples of Miralax to take 1 capful (17 grams) dissolved in at least 8 ounces of water/juice and drink once at breakfast and once at bedtime. You may titrate your dosage as needed. 2)  Please schedule a follow-up appointment as needed.  3)  Take Aciphex as needed for reflux symptoms. 4)  Copy sent to:  Sonda Primes, MD  5)  The medication list was reviewed and reconciled.  All changed / newly prescribed medications were explained.  A complete medication list was provided to the patient / caregiver. Prescriptions: MIRALAX  POWD (POLYETHYLENE GLYCOL 3350) Take 1 capful (17 grams) dissolved in at least 8 ounces of water/juice and drink twice daily. May titrate dose as needed  #527 grams x 2   Entered by:   Lamona Curl CMA (AAMA)   Authorized by:   Hart Carwin MD   Signed by:   Lamona Curl CMA (AAMA) on 10/11/2010   Method used:   Electronically to        [id number] (retail)       [address] [place], [place]  [address]       Ph: [phone]       Fax: [phone]   RxID:   [id number]

## 2010-12-11 NOTE — Letter (Signed)
Summary: [employer]: FMLA  [employer]: F[employer]A   Imported By: Florinda Marker 10/02/2010 13:56:36  _____________________________________________________________________  External Attachment:    Type:   Image     Comment:   External Document

## 2010-12-11 NOTE — Progress Notes (Signed)
Summary: patient calling about pain  Phone Note Call from Patient   Caller: Patient Summary of Call: patient called and said that pain is worse in his leg. Told the patient to keep the CT appointment on Thursday. Told patient I would get with the physician and relay the message about the pain.  Initial call taken by: Kathi Simpers [facility]),  July 17, 2010 4:40 PM  Follow-up for Phone Call        I am happy to refer to [facility] for excisional LN biopsy but I think getting the CT scan could be helpful as well  Follow-up by: Acey Lav MD,  July 18, 2010 8:37 AM

## 2010-12-11 NOTE — Letter (Signed)
Summary: New Patient letter  [facility]  [address] [place], [place] [address]   Phone: [phone]  Fax: [phone]       03/05/2010 MRN: [id number]  [facility] [address] [name], [place]  [address]  Dear Mr. FARIES,  Welcome to the Gastroenterology Division at [facility].    You are scheduled to see Dr. Jarold Motto on 03-29-10 at 9:00a.m. on the 3rd floor at [facility], [address]  We ask that you try to arrive at our office 15 minutes prior to your appointment time to allow for check-in.  We would like you to complete the enclosed self-administered evaluation form prior to your visit and bring it with you on the day of your appointment.  We will review it with you.  Also, please bring a complete list of all your medications or, if you prefer, bring the medication bottles and we will list them.  Please bring your insurance card so that we may make a copy of it.  If your insurance requires a referral to see a specialist, please bring your referral form from your primary care physician.  Co-payments are due at the time of your visit and may be paid by cash, check or credit card.     Your office visit will consist of a consult with your physician (includes a physical exam), any laboratory testing he/she may order, scheduling of any necessary diagnostic testing (e.g. x-ray, ultrasound, CT-scan), and scheduling of a procedure (e.g. Endoscopy, Colonoscopy) if required.  Please allow enough time on your schedule to allow for any/all of these possibilities.    If you cannot keep your appointment, please call [phone] to cancel or reschedule prior to your appointment date.  This allows [place] the opportunity to schedule an appointment for another patient in need of care.  If you do not cancel or reschedule by 5 p.m. the business day prior to your appointment date, you will be charged a $50.00 late cancellation/no-show fee.    Thank you for choosing  [facility] for your medical needs.  We appreciate the opportunity to care for you.  Please visit [place] at our website  to learn more about our practice.                     Sincerely,                                                             The Gastroenterology Division

## 2010-12-11 NOTE — Progress Notes (Signed)
Summary: Triage  Phone Note Call from Patient Call back at Home Phone [phone]   Caller: Patient Call For: Dr. Jarold Motto Reason for Call: Talk to Nurse Summary of Call: pt is having abd. pain, rectal bleeding. Has an appt. for 5-19-1...Marland Kitchenhe said he "cannot wait that long" Initial call taken by: Karna Christmas,  March 06, 2010 11:08 AM  Follow-up for Phone Call        Line busy.   Lupita Leash Surface RN  March 06, 2010 11:42 AM  LM for pt to call.   Lupita Leash Surface RN  March 06, 2010 12:07 PM  Talked with pt.  States he has had abd for a long time but is getting much worse.  Could not sleep for the past few nights due to the pain.  Req OV asap.  APpt sch with Mike Gip PA on 03/07/10. Follow-up by: Ashok Cordia RN,  March 06, 2010 12:17 PM

## 2010-12-11 NOTE — Letter (Signed)
Summary: [employer]; [employer]  [employer]   Imported By: Florinda Marker 09/19/2010 16:05:20  _____________________________________________________________________  External Attachment:    Type:   Image     Comment:   External Document

## 2010-12-11 NOTE — Progress Notes (Signed)
Summary: [facility] requesting f/u w/ Dr. Ninetta Lights, appt. 6/28 @ 3:00PM  Phone Note From Other Clinic   Caller: Provider Call For: appt Reason for Call: Schedule Patient Appt Action Taken: Phone Call Completed Details of Action Taken: Appt. w/ Dr. Ninetta Lights for Tues., June 28th.  Summary of Call: Pt. in [facility]. C/O nausea, lethargy, muscle aches, "feeling bad all over."  [facility] requesting appt. for pt. Chad Maduro RN  May 07, 2010 4:12 PM

## 2010-12-11 NOTE — Progress Notes (Signed)
Summary: ER  Phone Note Call from Patient   Summary of Call: Pt called and says he feels as though he is going to pass out. He said he just was picked up off the floor from some people who are with him. Due to increase in symptoms advised ER, he agreed.  Initial call taken by: Lamar Sprinkles, CMA,  September 28, 2010 11:24 AM  Follow-up for Phone Call        Pt called to say he is across the street at [facility]. Follow-up by: Verdell Face,  September 28, 2010 1:27 PM  Additional Follow-up for Phone Call Additional follow up Details #1::        Noted. Agree. Thank you!  Additional Follow-up by: Tresa Garter MD,  September 28, 2010 1:28 PM

## 2010-12-11 NOTE — Assessment & Plan Note (Signed)
Summary: 2 DAYS OF EPIGASTRIC PAIN, N/V, SOME BLOOD    (DR.BRODIE PT.).Marland KitchenMarland Kitchen   History of Present Illness Visit Type: Follow-up Visit Primary GI MD: Lina Sar MD Primary Provider: Georgina Quint Plotnikov MD Requesting Provider: Georgina Quint Plotnikov MD Chief Complaint: epigastric pain, left side pain, n,v  History of Present Illness:   30 YO SUDANESE MALE ,RECENTLY KNOWN TO DR. Juanda Chance WHO HAS UNDERGONE FAIRLY EXTENSIVE EVALUATION  FOR C/O UPPER ABDOMINAL PAIN ,NAUSEA. HE HAS HAD A NEGATIVE CT ABDOMEN PELVIS, NEGATIVE COLONOSCOPY. 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 HE HAD BEEN ON NEXIUM 40 MG DAILY FOR ABOUT A MONTH-STOPPED BECAUSE HE DIDNT FEEL ANY BETTER. HE TRIED BENTYL WITHOUT BENEFIT. HE HAS AT THIS POINT PHENERGAN AND ZOFRAN GIVEN BY DIIFERENT MD'S- AND AGAIN NOT HELPING. HE C/O EPIGASTRIC AND SOMETIMES MID ABDOMINAL PAIN, NAUSEA, AND DAILY VOMITING. HE SAYS HE VOMITIS MORE THAN ONCE SOME DAYS, AND HAS BROUGHT UP STREAKS OF BLOOD. HE FEELS FATIGUED, HURTS" ALL OVER"MY BODY. HE RELATED HE WOULD LIKE AN MRI OF HIS WHOLE BODY TO FIGURE OUT WHAT IS WRONG. HE IS UNDERGOING EVALUATION WITH DR. HATCHER/ID FOR POSSIBLE TB IN HIS THIGH. HE HAS HX OF TB.   GI Review of Systems    Reports abdominal pain, acid reflux, belching, bloating, heartburn, loss of appetite, nausea, vomiting, and  vomiting blood.     Location of  Abdominal pain: upper abdomen.    Denies chest pain, dysphagia with liquids, dysphagia with solids, and  weight loss.        Denies anal fissure, black tarry stools, change in bowel habit, constipation, diarrhea, diverticulosis, fecal incontinence, heme positive stool, hemorrhoids, irritable bowel syndrome, jaundice, light color stool, liver problems, rectal bleeding, and  rectal pain.    Current Medications (verified): 1)  Zofran Odt 4 Mg Tbdp (Ondansetron) .Marland Kitchen.. 1 Tab By Mouth Every Morning As Needed For Nausea 2)  Thigh High Support Stocking .... Use As Directed 3)  Nexium 40 Mg Cpdr  (Esomeprazole Magnesium) .Marland Kitchen.. 1 By Mouth Qam ( Medically Necessary) 4)  Promethazine Hcl 25 Mg Tabs (Promethazine Hcl) .... As Needed  Allergies (verified): 1)  ! 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 R groin ?TB infection surgery - remote completed in [place] Right foot surgery-completed in [place]  Family History: Reviewed history from 02/12/2010 and no changes required. Unknown  Social History: Reviewed history from 03/05/2010 and no changes required. 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 Alcohol use-no Drug use-no Regular exercise-yes  Review of Systems  The patient denies allergy/sinus, anemia, anxiety-new, arthritis/joint pain, back pain, blood in urine, breast changes/lumps, change in vision, confusion, cough, coughing up blood, depression-new, fainting, fatigue, fever, headaches-new, hearing problems, heart murmur, heart rhythm changes, itching, menstrual pain, muscle pains/cramps, night sweats, nosebleeds, pregnancy symptoms, shortness of breath, skin rash, sleeping problems, sore throat, swelling of feet/legs, swollen lymph glands, thirst - excessive , urination - excessive , urination changes/pain, urine leakage, vision changes, and voice change.         ROS OTHERWISE AS IN HPI  Vital Signs:  Patient profile:   30 year old male Height:      71 inches Weight:      205 pounds BMI:     28.70 Pulse rate:   62 / minute Pulse rhythm:  regular BP sitting:   100 / 52  (right arm)  Physical Exam  General:  Well developed, well nourished, no acute distress. Head:  Normocephalic and atraumatic. Eyes:  PERRLA, no icterus. Lungs:  Clear throughout to auscultation. Heart:  Regular rate and  rhythm; no murmurs, rubs,  or bruits. Abdomen:  SOFT, NO FOCAL TENDERNESS, NO PALP MASS OR HSM,BS+ Rectal:  NOT DONE Extremities:  No clubbing, cyanosis, edema or deformities noted. Neurologic:  Alert and  oriented x4;  grossly normal neurologically. Psych:  Alert and cooperative. Normal mood and affect.depressed affect. ,SOME LANGUAGE BARRIER    Impression & Recommendations:  Problem # 1:  NAUSEA AND VOMITING (ICD-787.01) Assessment Deteriorated 30 YO WITH C/O PERSISTENT NAUSEA AND VOMITING, EPIGASTRIC PAIN DESPITE MULTIPLE TRIALS OF MEDS.?COMPLIANCE WITH MEDS ?LANGUAGE/CULTURAL BARRIER TO COMMUNICATION. WORKUP THUS FAR POSITIVE FOR DUODENITIS. ?UNDERLYING GASTROPARESIS,ANXIETY/DEPRESSION-WITH MULTIPLE SYSTEMIC C/O.  TRY ACIPHEX 20 MG DAILY IN AM EVERY DAY X 2 MONTHS. STOP ZOFRN/PHENERGAN IF NOT HELPFUL TRIAL OF ATIVAN SHORT TERM 0.5 MG ONCE OR TWICE DAILY FOR ANTIEMETIC EFFECT. LABS AS BELOW CONSIDER GASTRIC EMPTYING SCAN FOLLOW UP WITH DR, BRODIE IN 2 WEEKS  Problem # 2:  TUBERCULOSIS, HX OF (ICD-V12.01) Assessment: Comment Only UNDERGONG CURRENT EVALUATION FOR TB ? IN THIGH PER ID. HE REQUESTS SOONER FOLLOW UP,WILL TRY TO FACILITATE. Orders: Infectious Disease Referral (ID)  Other Orders: TLB-CMP (Comprehensive Metabolic Pnl) (80053-COMP) TLB-CK Total Only(Creatine Kinase/CPK) (82550-CK) TLB-CBC Platelet - w/Differential (85025-CBCD) TLB-Sedimentation Rate (ESR) (85652-ESR) T-ANA ([id number])  Patient Instructions: 1)  Please go to the lab in the basment before leaving the office today. Your provider would like for you to have your CMP, CK, CBC-Diff, ESR and ANA drawn today. 2)  Please pick up your prescription for Ativan at the pharmacy. We have sent that script electronically. 3)  You have been scheduled to see Dr Ninetta Lights again Tuesday, 05/08/10 @ 3 pm.  4)  You have been scheduled to see Dr Lina Sar on 05/24/10 @ 8:45 am for follow up. Please call 24 hours in advance  should you need to reschedule in order to avoid a $50 cancellation fee. 5)  Please take Aciphex samples given to you. You will take one tablet daily in place of Nexium. We have sent a new prescription of Aciphex to your pharmacy for you to pick up if the samples work. 6)  Copy sent to : Dr Sonda Primes, Dr Johny Sax, Dr Lina Sar 7)  The medication list was reviewed and reconciled.  All changed / newly prescribed medications were explained.  A complete medication list was provided to the patient / caregiver. Prescriptions: ATIVAN 0.5 MG TABS (LORAZEPAM) Take 1 tablet by mouth one to two times daily as needed for nausea and nerves  #60 x 0   Entered by:   Lamona Curl CMA (AAMA)   Authorized by:   Sammuel Cooper PA-c   Signed by:   Lamona Curl CMA (AAMA) on 05/07/2010   Method used:   Printed then faxed to ...       [facility] [address] [id number]* (retail)       [address] [place], [place]  [address]       Ph: [phone]       Fax: [phone]   RxID:   [id number] ACIPHEX 20 MG TBEC (RABEPRAZOLE SODIUM) Take 1 tablet by mouth 30 minutes before breakfast meal. (PHARMACY-PLEASE D/C RX FOR NEXIUM.Marland KitchenMarland KitchenIT IS INEFFECTIVE)  #30 x 2  Entered by:   Lamona Curl CMA (AAMA)   Authorized by:   Sammuel Cooper PA-c   Signed by:   Lamona Curl CMA (AAMA) on 05/07/2010   Method used:   Electronically to        [id number]* (retail)       [address] [place], [place]  [address]       Ph: [phone]       Fax: [phone]   RxID:   [id number]

## 2010-12-11 NOTE — Progress Notes (Signed)
  Phone Note Call from Patient   Caller: Patient Summary of Call: Patient c/o rt leg pain states it is increasing in pain. Patient wants Dr. Daiva Eves to call him so he discuss  this further. Initial call taken by: Starleen Arms CMA,  July 12, 2010 3:06 PM  Follow-up for Phone Call        I spoke with the pt. His ACE level is high (slightly( suggesting the possibility of sacroidosiss that like TB would have shown granulmoas on pathology. We will proceed with CT abd, pelvs and add a CXR to look for hilar adenopathy. We will then proceed to consult [facility] for excisional LN biopsy of right lymph node with path, AFB, fungal and bacterial cultures. Follow-up by: Acey Lav MD,  July 12, 2010 7:44 PM

## 2010-12-11 NOTE — Assessment & Plan Note (Signed)
Summary: F/U OV/VS   Visit Type:  Follow-up Referring Provider:  na Primary Provider:  Tresa Garter MD  CC:  rt leg pain.  History of Present Illness: 30 yo with questionabe hx of TB in [place], diagnosed on LN biopsy. 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 He has suffered from lymphedema on right side where he had prior exicisional LN biopsy and also claimed to be having night sweats without fevers. His ACE level was elevated. He had FNA by IR which showed benign reactive tissue without granuLOMAS.  His AFB ,fungal and bacterial cultures without growht. He continues to have pain in his thigh and groin. He returns to clinc for biopsy of hyperpigmented area on his foot.   Risks and benefits of procedure were explained in detail and informed consent obtained. Arrea on ventral aspect of his foot was prepped in usual sterile manner. Subcutaneous area was infiltrated with 1% liidocaine. Two punch biopsies obtained. One sent in sterile container for culture and the other for pathological exam.  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) 7)  Inguinal Lymphadenopathy, Right  (ICD-785.6) 8)  Back Pain, Lumbar  (ICD-724.2) 9)  Leg Pain, Right  (ICD-729.5) 10)  Upper Respiratory Infection, Acute  (ICD-465.9) 11)  Lymphedema, Right Leg  (ICD-457.1) 12)  Insomnia, Chronic  (ICD-307.42) 13)  Abdominal Pain-epigastric  (ICD-789.06) 14)  Nausea and Vomiting  (ICD-787.01) 15)  Tuberculosis  (ICD-011.90) 16)  Night Sweats  (ICD-780.8) 17)  Nausea  (ICD-787.02) 18)  Thigh Pain  (ICD-729.5) 19)  Duodenitis  (ICD-535.60) 20)  Fecal Occult Blood  (ICD-792.1) 21)  Dysphagia  (ICD-787.29) 22)  Heartburn  (ICD-787.1) 23)  Weight Loss-abnormal  (ICD-783.21) 24)  Abdominal Pain-periumbilical   (ICD-789.05) 25)  Weight Loss  (ICD-783.21) 26)  Dysphagia Unspecified  (ICD-787.20) 27)  Gerd  (ICD-530.81) 28)  Blood in Stool  (ICD-578.1) 29)  Abdominal Pain Other Specified Site  (ICD-789.09) 30)  Arthralgia  (ICD-719.40) 31)  Bronchitis, Acute  (ICD-466.0) 32)  Hx of Tinea Pedis  (ICD-110.4) 33)  Gastroesophageal Reflux Disease  (ICD-530.81) 34)  Plantar Fasciitis, Bilateral  (ICD-728.71)  Medications Prior to Update: 1)  Vitamin D 1000 Unit Tabs (Cholecalciferol) .Marland Kitchen.. 1 By Mouth Qd 2)  Amitiza 24 Mcg Caps (Lubiprostone) .Marland Kitchen.. 1 By Mouth Once Daily As Needed Constipation 3)  Zofran Odt 4 Mg Tbdp (Ondansetron) .... Take Every Six Hours As Needed 4)  Milk of Magnesia 400 Mg/82ml Susp (Magnesium Hydroxide) .... Use As Dirrected For Constipation 5)  Tramadol Hcl 50 Mg Tabs (Tramadol Hcl) .... Take 1-2 Tablets Every 6 Hours As Needed For Pain  Current Medications (verified): 1)  Vitamin D 1000 Unit Tabs (Cholecalciferol) .Marland Kitchen.. 1 By Mouth Qd 2)  Amitiza 24 Mcg Caps (Lubiprostone) .Marland Kitchen.. 1 By Mouth Once Daily As Needed Constipation 3)  Zofran Odt 4 Mg Tbdp (Ondansetron) .... Take Every Six Hours As Needed 4)  Milk of Magnesia 400 Mg/41ml Susp (Magnesium Hydroxide) .... Use As Dirrected For Constipation 5)  Tramadol Hcl 50 Mg Tabs (Tramadol Hcl) .... Take 1-2 Tablets Every 6 Hours As Needed For Pain  Allergies: 1)  ! * Contrast Dye    Current Allergies (reviewed today): ! * CONTRAST DYE Family History: Reviewed history from 02/12/2010 and no changes required. Unknown  Social History: Reviewed  history from 03/05/2010 and no changes required. Occupation: [employer] Single Never Smoked Alcohol use-no Drug use-no Regular exercise-yes  Review of Systems       The patient complains of suspicious skin lesions.  The patient denies anorexia, fever, weight loss, weight gain, vision loss, decreased hearing, hoarseness, chest pain, syncope, dyspnea on exertion, peripheral edema,  prolonged cough, headaches, hemoptysis, abdominal pain, melena, hematochezia, severe indigestion/heartburn, hematuria, incontinence, genital sores, muscle weakness, transient blindness, difficulty walking, depression, unusual weight change, abnormal bleeding, and enlarged lymph nodes.    Vital Signs:  Patient profile:   30 year old male Height:      71 inches (180.34 cm) Weight:      200 pounds (90.91 kg) BMI:     28.00 Temp:     98.8 degrees F (37.11 degrees C) oral Pulse rate:   66 / minute BP sitting:   110 / 75  (left arm)  Vitals Entered By: Starleen Arms CMA (September 10, 2010 2:41 PM) CC: rt leg pain Is Patient Diabetic? No Pain Assessment Patient in pain? no      Nutritional Status BMI of 25 - 29 = overweight Nutritional Status Detail nl  Does patient need assistance? Functional Status Self care Ambulation Normal   Physical Exam  General:  alert, well-developed, well-nourished, and well-hydrated.   Head:  normocephalic, atraumatic, and no abnormalities observed.   Eyes:  pupils equal, pupils round, and pupils reactive to light.   Ears:  no external deformities.   Mouth:  pharynx pink and moist.   Neck:  has lipoma on his back that has appeared in past several months Lungs:  normal respiratory effort and normal breath sounds.   Heart:  normal rate, regular rhythm, and no murmur.   Abdomen:  soft, non-tender, and normal bowel sounds.   Extremities:  He has extensive 3+ edema in the right thigh and calve Neurologic:  alert & oriented X3 and strength normal in all extremities.   Skin:  he has 3+ edema on the right, area of LN tender to palpation skin on sole of foot is chrnoically hyperpigmented and thickened Psych:  Oriented X3 and normally interactive.     Impression & Recommendations:  Problem # 1:  CONTACT DERMATITIS&OTHER ECZEMA DUE UNSPEC CAUSE (ICD-692.9)  Unclear cause of hyperpigmented area on sole of foot. Two biopsies taken onf for AFB, fungal and  bacterial cultures the other sent for pathological exam see if these might lead to diagnosis of cause of his symptoms of subjective night sweats, thigh pain and lymphedema. As discussed previously sarcoid is higher in differential and TB seems hihgly unlikely as recurrent infection in this pt  Orders: Est. Patient Level IV ([address]) Biopsy (Punch) Skin, Single Lesion ([address]100) Biopsy (Punch) Skin, each additional Lesion ([address]101)  Problem # 2:  INGUINAL LYMPHADENOPATHY, RIGHT (ICD-785.6)  see aboved discussion. Sarcoid possible. TB unlikely.   Orders: Est. Patient Level IV ([address])  Problem # 3:  LYMPHEDEMA, RIGHT LEG (ICD-457.1) likely largely due to inguinal LN resection but open to idea that sarcoid or other systemic illness could be playing  a role  Problem # 4:  TUBERCULOSIS (ICD-011.90) May indeed have had in [place] but recurrence x 2 more times seems extraordinarily unlikely without nidus of infection Orders: T-Culture, Wound ([id number]) T-Culture & Smear AFB ([id number]) T- * Misc. Laboratory test [phone]) T- * Misc. Laboratory test [phone]) Est. Patient Level IV ([address])  Patient Instructions: 1)  Minimize trauma to your foot 2)  Make followup  appt to see Dr. Daiva Eves in 2 weeks on Friday overbook clinic

## 2010-12-11 NOTE — Miscellaneous (Signed)
Summary: Medical Surgical Procedures  Medical Surgical Procedures   Imported By: Florinda Marker 09/14/2010 14:54:35  _____________________________________________________________________  External Attachment:    Type:   Image     Comment:   External Document

## 2010-12-11 NOTE — Progress Notes (Signed)
Summary: Medication  Phone Note From Pharmacy   Caller: [facility].  [phone] Call For: Dr. Juanda Chance  Summary of Call: Aciphex is too expensive...Marland Kitchenneeds an alternative med Initial call taken by: Karna Christmas,  June 12, 2010 10:06 AM    New/Updated Medications: NEXIUM 40 MG CPDR (ESOMEPRAZOLE MAGNESIUM) one tablet by mouth once daily Prescriptions: NEXIUM 40 MG CPDR (ESOMEPRAZOLE MAGNESIUM) one tablet by mouth once daily  #30 x 2   Entered by:   Christie Nottingham CMA (AAMA)   Authorized by:   Hart Carwin MD   Signed by:   Christie Nottingham CMA (AAMA) on 06/12/2010   Method used:   Electronically to        [facility] [address] [id number]* (retail)       [address] [place], [place]  [address]       Ph: [phone]       Fax: [phone]   RxID:   [id number]

## 2010-12-11 NOTE — Letter (Signed)
Summary: EGD Instructions  [facility]  [address] [place], [place] [address]   Phone: [phone]  Fax: [phone]       Chad Spears    11-11-1981    MRN: [id number]       Procedure Day /Date:03-08-10     Arrival Time: 7:30 AM      Procedure Time: 8:30 AM     Location of Procedure:                    X     [facility] (4th Floor)  PREPARATION FOR ENDOSCOPY   On 03-08-10 THE DAY OF THE PROCEDURE:  1.   No solid foods, milk or milk products are allowed after midnight the night before your procedure.  2.   Do not drink anything colored red or purple.  Avoid juices with pulp.  No orange juice.  3.  You may drink clear liquids until  6:30 AM , which is 2 hours before your procedure.                                                                                                CLEAR LIQUIDS INCLUDE: Water Jello Ice Popsicles Tea (sugar ok, no milk/cream) Powdered fruit flavored drinks Coffee (sugar ok, no milk/cream) Gatorade Juice: apple, white grape, white cranberry  Lemonade Clear bullion, consomm, broth Carbonated beverages (any kind) Strained chicken noodle soup Hard Candy   MEDICATION INSTRUCTIONS  Unless otherwise instructed, you should take regular prescription medications with a small sip of water as early as possible the morning of your procedure.         OTHER INSTRUCTIONS  You will need a responsible adult at least 32 years of age to accompany you and drive you home.   This person must remain in the waiting room during your procedure.  Wear loose fitting clothing that is easily removed.  Leave jewelry and other valuables at home.  However, you may wish to bring a book to read or an iPod/MP3 player to listen to music as you wait for your procedure to start.  Remove all body piercing jewelry and leave at home.  Total time from sign-in until discharge is approximately 2-3 hours.  You should go home directly after your  procedure and rest.  You can resume normal activities the day after your procedure.  The day of your procedure you should not:   Drive   Make legal decisions   Operate machinery   Drink alcohol   Return to work  You will receive specific instructions about eating, activities and medications before you leave.    The above instructions have been reviewed and explained to me by   _______________________    I fully understand and can verbalize these instructions _____________________________ Date _________

## 2010-12-11 NOTE — Assessment & Plan Note (Signed)
Summary: FU--D/T---STC   Vital Signs:  Patient profile:   30 year old male Height:      71 inches (180.34 cm) Weight:      208.13 pounds (94.60 kg) BMI:     29.13 O2 Sat:      98 % on Room air Temp:     98.2 degrees F (36.78 degrees C) oral Pulse rate:   60 / minute BP sitting:   104 / 66  (left arm) Cuff size:   regular  Vitals Entered By: Lucious Groves (April 27, 2010 10:11 AM)  O2 Flow:  Room air CC: Follow-up visit./kb Is Patient Diabetic? No Pain Assessment Patient in pain? no        Primary Care Provider:  Georgina Quint Kimley Apsey MD  CC:  Follow-up visit./kb.  History of Present Illness: F/u R thigh pain w/walking  and n/v in am's. Overall better.  Current Medications (verified): 1)  Vitamin D 1000 Unit Tabs (Cholecalciferol) .Marland Kitchen.. 1 By Mouth Qd 2)  Bentyl 10 Mg Caps (Dicyclomine Hcl) .... Take 1 Tab Twice Daily As Needed 3)  Promethazine Hcl 25 Mg Tabs (Promethazine Hcl) .Marland Kitchen.. 1-2 By Mouth Four Times A Day As Needed Nausea 4)  Thigh High Support Stocking .... Use As Directed 5)  Lasix 20 Mg Tabs (Furosemide) .... Take 1 Tablet By Mouth Once A Day  Allergies (verified): 1)  ! 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  The patient denies fever, weight loss, and dyspnea on exertion.    Physical Exam  General:  alert, well-developed, well-nourished, well-hydrated, appropriate dress, normal appearance, healthy-appearing, cooperative to examination, and  good hygiene.   Mouth:  Oral mucosa and oropharynx without lesions or exudates.  Teeth in good repair. Abdomen:  soft, non-tender, normal bowel sounds, no distention, no masses, no guarding, no rigidity, no rebound tenderness, no abdominal hernia, no inguinal hernia, no hepatomegaly, and no splenomegaly.   Msk:  No deformity or scoliosis noted of thoracic or lumbar spine.  R groin is sensitive to palp Extremities:  No clubbing, cyanosis, edema, or deformity noted with normal full range of motion of all joints.   Neurologic:  No cranial nerve deficits noted. Station and gait are normal. Plantar reflexes are down-going bilaterally. DTRs are symmetrical throughout. Sensory, motor and coordinative functions appear intact. Skin:  No rash   Impression & Recommendations:  Problem # 1:  THIGH PAIN (ICD-729.5) Assessment Unchanged Appt w/Dr Ninetta Lights is pending 6/22  2pm  I gave him a map and the dirrections to his office  Problem # 2:  NAUSEA (ICD-787.02) Assessment: Unchanged Cont as needed Promethazine Start Nexium samples  Complete Medication List: 1)  Vitamin D 1000 Unit Tabs (Cholecalciferol) .Marland Kitchen.. 1 by mouth qd 2)  Bentyl 10 Mg Caps (Dicyclomine hcl) .... Take 1 tab twice daily as needed 3)  Promethazine Hcl 25 Mg Tabs (Promethazine hcl) .Marland Kitchen.. 1-2 by mouth four times a day as needed nausea 4)  Thigh High Support Stocking  .... Use as directed 5)  Lasix 20 Mg Tabs (Furosemide) .Marland KitchenMarland KitchenMarland Kitchen  Take 1 tablet by mouth once a day 6)  Nexium 40 Mg Cpdr (Esomeprazole magnesium) .Marland Kitchen.. 1 by mouth qam ( medically necessary)  Patient Instructions: 1)  Nexium 1 capsule  daily 2)  Please schedule a follow-up appointment in 3 months. Prescriptions: PROMETHAZINE HCL 25 MG TABS (PROMETHAZINE HCL) 1-2 by mouth four times a day as needed nausea  #60 x 3   Entered and Authorized by:   Tresa Garter MD   Signed by:   Tresa Garter MD on 04/27/2010   Method used:   Print then Give to Patient   RxID:    [id number] NEXIUM 40 MG CPDR (ESOMEPRAZOLE MAGNESIUM) 1 by mouth qam ( medically necessary)  #30 x 3   Entered and Authorized by:   Tresa Garter MD   Signed by:   Tresa Garter MD on 04/27/2010   Method used:   Print then Give to Patient   RxID:   [id number]

## 2010-12-11 NOTE — Assessment & Plan Note (Signed)
Summary: 2 MTH FU--D/T--STC   Vital Signs:  Patient profile:   30 year old male Height:      71 inches Weight:      194 pounds BMI:     27.16 Temp:     97.7 degrees F oral Pulse rate:   68 / minute Pulse rhythm:   regular Resp:     16 per minute BP sitting:   90 / 60  (left arm) Cuff size:   regular  Vitals Entered By: Lanier Prude, CMA(AAMA) (September 27, 2010 8:10 AM) CC: Abdominal pain, nausea and decreased appetitie Is Patient Diabetic? No Comments pt was evaluated at [facility] ER yesterday   Primary Care Provider:  Tresa Garter MD  CC:  Abdominal pain and nausea and decreased appetitie.  History of Present Illness: The patient presents for a follow up of abd pain and vomiting - he went to [facility] ER on 11/16. C/o [place]akness. C/o B sides of his abd have pains - like needles  Allergies: 1)  ! 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  The patient denies anorexia, fever, weight loss, weight gain, melena, hematochezia, and abnormal bleeding.    Physical Exam  General:  alert, well-developed, well-nourished, well-hydrated, appropriate dress, normal appearance, healthy-appearing, cooperative to examination, and good hygiene.   Mouth:  good dentition, no gingival abnormalities, no dental plaque, pharynx pink and moist, no erythema, no posterior lymphoid hypertrophy, no lesions, no tongue abnormalities, no leukoplakia, and no petechiae.   Lungs:   Normal respiratory effort, chest expands symmetrically. Lungs are clear to auscultation, no crackles or wheezes. Heart:  Normal rate and regular rhythm. S1 and S2 normal without gallop, murmur, click, rub or other extra sounds. Abdomen:  soft, non-tender, normal bowel sounds, no distention, no masses, no guarding, no rigidity, no rebound tenderness, no abdominal hernia, no inguinal hernia, no hepatomegaly, and no splenomegaly.   Msk:  No deformity or scoliosis noted of thoracic or lumbar spine.   Neurologic:  No cranial nerve deficits noted. Station and gait are normal. Plantar reflexes are down-going bilaterally. DTRs are symmetrical throughout. Sensory, motor and coordinative functions appear intact. Skin:  turgor normal, color normal, no rashes, no suspicious lesions, no ecchymoses, no petechiae, and no purpura.   Psych:  Oriented X3, memory intact for recent and remote, normally interactive, good eye contact, not anxious appearing, some depressed appearing, not agitated, not suicidal, and not homicidal.     Impression & Recommendations:  Problem # 1:  ABDOMINAL PAIN-EPIGASTRIC (ICD-789.06) Assessment Unchanged  Will treat empirically #1 with Hydrocortisone. Risks vs benefits and controversies of a long term steroid use were discussed.  If no effect - would try Remeron at hs His updated medication list for this problem includes:    Amitiza 24 Mcg Caps (Lubiprostone) .Marland Kitchen... 1 by mouth once daily as needed constipation Sick since 11/15. 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 1 by mouth qd 2)  Amitiza 24 Mcg Caps (Lubiprostone) .Marland Kitchen.. 1 by mouth once daily as needed constipation 3)  Zofran Odt 4 Mg Tbdp (Ondansetron) .... Take every six hours as needed 4)  Milk of Magnesia 400 Mg/21ml Susp (Magnesium hydroxide) .... Use as dirrected for constipation 5)  Tramadol Hcl 50 Mg Tabs (Tramadol hcl) .... Take 1-2 tablets every 6 hours as needed for pain 6)  Dexilant 60 Mg Cpdr (Dexlansoprazole) .... One by mouth once daily for heartburn 7)  Cortef 10 Mg Tabs (Hydrocortisone) .Marland Kitchen.. 1 by mouth in am and 1 at lunch  Patient Instructions: 1)  Please schedule a follow-up appointment in 2 weeks. Prescriptions: CORTEF 10 MG TABS (HYDROCORTISONE) 1 by mouth in am and 1 at lunch  #60 x 2   Entered and Authorized by:   Tresa Garter MD   Signed by:   Tresa Garter MD on 09/27/2010   Method used:   Print then Give to Patient   RxID:   [id number]    Orders Added: 1)  Est. Patient Level IV [14782] 2)  TLB-BMP (Basic Metabolic Panel-BMET) [80048-METABOL] 3)  TLB-Cortisol [95621-HYQM]

## 2010-12-11 NOTE — Assessment & Plan Note (Signed)
Summary: FU / NWS  #   Vital Signs:  Patient profile:   30 year old male Height:      71 inches Weight:      206 pounds BMI:     28.84 O2 Sat:      99 % on Room air Temp:     98.4 degrees F oral Pulse rate:   72 / minute Pulse rhythm:   regular Resp:     16 per minute BP sitting:   102 / 60  (left arm) Cuff size:   regular  Vitals Entered By: Lanier Prude, CMA(AAMA) (July 06, 2010 7:53 AM)  O2 Flow:  Room air CC: f/u c/o Abd pain and Headaches Is Patient Diabetic? No   Primary Care Provider:  Tresa Garter MD  CC:  f/u c/o Abd pain and Headaches.  History of Present Illness: C/o abd pain at times, tiredness, ST,HAs body is shaking x 1.5 wks. He is a poor historian. C/o slight cough. No diarrhea... Aleve helped.  He took [place] for his  love life - had problems w/side effects (it was  a Viagra knock off of some kind, I guess)  Current Medications (verified): 1)  Promethazine Hcl 25 Mg Tabs (Promethazine Hcl) .... As Needed 2)  Colace 100 Mg Caps (Docusate Sodium) .... Take 1 Tablet By Mouth Once A Day 3)  Aciphex 20 Mg Tbec (Rabeprazole Sodium) .Marland Kitchen.. 1 By Mouth 4)  Thigh High Support Stocking .... Use As Directed 5)  Trazodone Hcl 50 Mg Tabs (Trazodone Hcl) .Marland Kitchen.. 1 By Mouth At Bedtime For Sleep  Allergies (verified): 1)  ! 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  The patient denies fever, hoarseness, chest pain, dyspnea on exertion, and depression.    Physical Exam  General:  alert,  well-developed, well-nourished, well-hydrated, appropriate dress, normal appearance, healthy-appearing, cooperative to examination, and good hygiene.   Ears:  R ear normal and L ear normal.   Mouth:  Erythematous throat and intranasal mucosa c/w URI  Neck:  supple, full ROM, no masses, no thyromegaly, no thyroid nodules or tenderness, no JVD, normal carotid upstroke, and no cervical lymphadenopathy.   Lungs:  Normal respiratory effort, chest expands symmetrically. Lungs are clear to auscultation, no crackles or wheezes. Heart:  Normal rate and regular rhythm. S1 and S2 normal without gallop, murmur, click, rub or other extra sounds. Abdomen:  soft, non-tender, normal bowel sounds, no distention, no masses, no guarding, no rigidity, no rebound tenderness, no abdominal hernia, no inguinal hernia, no hepatomegaly, and no splenomegaly.   Msk:  No deformity or scoliosis noted of thoracic or lumbar spine.  R groin is sensitive to palp Neurologic:  No cranial nerve deficits noted. Station and gait are normal. Plantar reflexes are down-going bilaterally. DTRs are symmetrical throughout. Sensory, motor and coordinative functions appear intact. Skin:  No rash Psych:  Cognition and judgment appear intact. Alert and cooperative with normal attention span and concentration. No apparent delusions, illusions, hallucinations depressed affect.     Impression & Recommendations:  Problem # 1:  UPPER RESPIRATORY INFECTION, ACUTE (ICD-465.9)  Assessment New  Zpac Prom/cod  Problem # 2:  ABDOMINAL PAIN-EPIGASTRIC (ICD-789.06) Assessment: Unchanged Likely IBS and possibly conversion disorder  Complete Medication List: 1)  Promethazine Hcl 25 Mg Tabs (Promethazine hcl) .... As needed 2)  Colace 100 Mg Caps (Docusate sodium) .... Take 1 tablet by mouth once a day 3)  Aciphex 20 Mg Tbec (Rabeprazole sodium) .Marland Kitchen.. 1 by mouth 4)  Thigh High Support Stocking  .... Use as directed 5)  Trazodone Hcl 50 Mg Tabs  (Trazodone hcl) .Marland Kitchen.. 1 by mouth at bedtime for sleep 6)  Zithromax Z-pak 250 Mg Tabs (Azithromycin) .... As dirrected 7)  Promethazine-codeine 6.25-10 Mg/9ml Syrp (Promethazine-codeine) .... 5-10 ml by mouth q id as needed cough  Patient Instructions: 1)  Call if you are not better in a reasonable amount of time or if worse.  2)  Please schedule a follow-up appointment in 2 months. Prescriptions: PROMETHAZINE-CODEINE 6.25-10 MG/5ML SYRP (PROMETHAZINE-CODEINE) 5-10 ml by mouth q id as needed cough  #300 ml x 0   Entered and Authorized by:   Tresa Garter MD   Signed by:   Tresa Garter MD on 07/06/2010   Method used:   Print then Give to Patient   RxID:   [id number] ZITHROMAX Z-PAK 250 MG TABS (AZITHROMYCIN) as dirrected  #1 x 0   Entered and Authorized by:   Tresa Garter MD   Signed by:   Tresa Garter MD on 07/06/2010   Method used:   Print then Give to Patient   RxID:   [id number]

## 2010-12-11 NOTE — Assessment & Plan Note (Signed)
Summary: 2WK F/U/VS   Referring Provider:  na Primary Provider:  Georgina Quint Plotnikov MD  CC:  2 week follow up.  History of Present Illness: 30 yo M from [place] moved to [place] 04-2000.  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 He was treated with 6 months of therapy while in [place]  (he is unable to remember the names of his medicines, believes he took 3 or 4 meds). He was prev eval by ID in 2001 for R leg edema which was felt to be chronic and that he did not have active TB.  he has worsening abd pain and RLE pain and night sweats. Has been having worsening RLE edema, esp after exertion.    He had f/u CT scan 03-09-10: Unremarkable abdominal pelvic appearance.  No focal bowel abnormality suggested.  Irregular area of soft tissue density in the medial anterior thigh with adjacent associated skin thickening with no enhancement. The appearance suggests scarring/fibrosis.  Recently had negative HIV/Hep C/RPR. Hep B Surface Ab+ and Core Ab+. Quantiferon gold +.  He was seen in ID with the presumption that he had imcompletely treated TB. He was sent for u/s guided biopsy of his r thigh scarring. On discussion with IR they were unable to find any material, abscess/LN to biopsy.   He has had f/u with GI and has been told he has IBS.   He had f/u on 05-29-10 and then had  MRI to eval his R foot (original site of infection): 1.  Minimal inflammatory changes in the plantar subcutaneous fat which may reflect cellulitis. 2.  No MR findings for myofasciitis, soft tissue abscess or osteomyelitis. Still cont to have pain in his leg. Denies further fevers. Denies diarrhea but does have constipation.   Preventive Screening-Counseling & Management  Alcohol-Tobacco     Alcohol drinks/day: 0     Smoking Status: never  Caffeine-Diet-Exercise     Caffeine use/day: 0     Does Patient Exercise: yes  Safety-Violence-Falls     Seat Belt Use: yes   Updated Prior Medication List: ZOFRAN ODT  4 MG TBDP (ONDANSETRON) 1 tab by mouth every morning as needed for nausea * THIGH HIGH SUPPORT STOCKING Use as directed PROMETHAZINE HCL 25 MG TABS (PROMETHAZINE HCL) as needed ATIVAN 0.5 MG TABS (LORAZEPAM) Take 1 tablet by mouth one to two times daily as needed for nausea and nerves PAXIL 20 MG TABS (PAROXETINE HCL) Take 1 tablet by mouth at bedtime COLACE 100 MG CAPS (DOCUSATE SODIUM) Take 2 tablets by mouth once daily ACIPHEX 20 MG TBEC (RABEPRAZOLE SODIUM) Take 1 tablet by mouth once a day  Current Allergies (reviewed today): ! * CONTRAST DYE Past History:  Past medical, surgical, family and social histories (including risk factors) reviewed, and no changes noted (except as noted below).  Past Medical History: Reviewed history from 05/07/2010 and no changes required. 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 R groin ?TB infection surgery - remote completed in [place] Right foot surgery-completed in [place]  Family History: Reviewed history from 02/12/2010 and no changes required. Unknown  Social History: Reviewed history from 03/05/2010 and no changes required. Occupation: [employer] Single Never Smoked Alcohol use-no Drug use-no Regular exercise-yes  Review of Systems       cont pain in bottom  of foot, sacrum and groin on R. has been feeling weak as well. wt unchanged.   Vital Signs:  Patient profile:   30 year old male Height:      71 inches (180.34 cm) Weight:      201.12 pounds (91.42 kg) BMI:     28.15 Temp:     98.1 degrees F (36.72 degrees C) oral Pulse rate:   87 / minute BP sitting:   114 / 71  (left arm)  Vitals Entered By: Baxter Hire) (June 11, 2010 2:57 PM) CC: 2 week follow up Pain Assessment Patient in pain? yes      Location: right foot and leg Intensity: 6 Type: burning Onset of pain  Constant Nutritional Status BMI of 25 - 29 = overweight Nutritional Status Detail appetite is okay per patient  Does patient need assistance? Functional Status Self care Ambulation Normal   Physical Exam  General:  well-developed, well-nourished, and well-hydrated.   Eyes:  pupils equal, pupils round, and pupils reactive to light.   Mouth:  pharynx pink and moist.   Neck:  no masses.   Lungs:  normal respiratory effort and normal breath sounds.   Heart:  normal rate, regular rhythm, and no murmur.   Abdomen:  soft, non-tender, and normal bowel sounds.   Extremities:  2+ right pedal edema.     Impression & Recommendations:  Problem # 1:  TUBERCULOSIS (ICD-011.90)  will refer him to health dept. I am not sure he had adequate rx previously and his recent fevers, night sweats are concerning. There does not appear to be an abscess or drainablle colection in his foot or thigh making diagnosis more difficult. Only test + currently is quantiferon gold and it is unclear of the utility of this with his history of TB  Orders: Est. Patient Level IV (11914)  Problem # 2:  ABDOMINAL PAIN-EPIGASTRIC (ICD-789.06)  would like something thelp him with constipation. will give rx for colace.   Orders: Est. Patient Level IV (78295)  Medications Added to Medication List This Visit: 1)  Colace 100 Mg Caps (Docusate sodium) .... Take 1 tablet by mouth once a day Prescriptions: COLACE 100 MG CAPS (DOCUSATE SODIUM) Take 1 tablet by mouth once a day  #10 x 3   Entered and Authorized by:   Johny Sax MD   Signed by:   Johny Sax MD on 06/11/2010   Method used:   Electronically to        [facility] [address] [id number]* (retail)       [address] [place], [place]  [address]       Ph: [phone]       Fax: [phone]   RxID:   [id number]

## 2010-12-11 NOTE — Letter (Signed)
Summary: Consult Scheduled [facility]  [address] [place], [place] [address]   Phone: [phone]  Fax: [phone]    03/05/2010 MRN: [id number]    Dear Mr. FOWLES,      We have scheduled an appointment for you.  At the recommendation of Dr.Plotnikov, we have scheduled you a consult with Dr Jarold Motto [facility]) on May 19,2011(THUR) at 9:00AM .Their phone number is [phone].If this appointment day and time is not convenient for you, please feel free to call the office of the doctor you are being referred to at the number listed above and reschedule the appointment.  If you have any questions, please call and speak with Carlton Adam at [phone].   Thank you,  Shelbie Proctor [facility]

## 2010-12-11 NOTE — Assessment & Plan Note (Signed)
Summary: N/V, ABD. PAIN                    DEBORAH   History of Present Illness Visit Type: consult  Primary GI MD: Lina Sar MD Primary Provider: Tresa Garter MD Requesting Provider: Tresa Garter MD Chief Complaint: Pt c/o nausea, vomiting, generalized abd pain, and back pain  History of Present Illness:   This is a 30 year old [place] African American male with  a one-year history of right leg pain extending to the right groin and right lower quadrant of the abdomen and eventually to his back.The occurs daily but more so if he does heavy work. The pain gets worse when he is on his feet and is usually associated with swelling of his entire leg. He has occasional night sweats and had a weight loss of 11 pounds which has stabilized in the last 3 months. It is worse when he bends over. His appetite has been decreased. 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 We do not have the  records. The initial CT scan of the abdomen and pelvis in 2001 after he arrived to the [place] showed pelvic adenopathy with calcifications. His most recent CT scan does not show any evidence of adenopathy or calcifications. He was evaluated by Dr Elio Forget in 2001 who thought that the TB was not active and that the swelling in the right leg was due to chronic lymphedema.But he is having more pain now than before.  An upper endoscopy recently showed duodenitis consistent with peptic duodenitis. There was pyloric metaplasia and mild villous blunting. He has complained of constipation. All of his labs have been normal.   GI Review of Systems    Reports abdominal pain, nausea, and  vomiting.     Location of  Abdominal pain: generalized.    Denies acid reflux, belching, bloating, chest pain, dysphagia with liquids, dysphagia with solids, heartburn, loss of appetite, vomiting blood, weight loss, and  weight gain.        Denies anal fissure, black tarry stools, change in bowel habit, constipation, diarrhea, diverticulosis, fecal incontinence, heme positive stool, hemorrhoids, irritable bowel syndrome, jaundice, light color stool, liver problems, rectal bleeding, and  rectal pain.    Current Medications (verified): 1)  Vitamin D 1000 Unit Tabs (Cholecalciferol) .Marland Kitchen.. 1 By Mouth Qd 2)  Bentyl 10 Mg Caps (Dicyclomine Hcl) .... Take 1 Tab Twice Daily As Needed 3)  Promethazine Hcl 25 Mg Tabs (Promethazine Hcl) .Marland Kitchen.. 1-2 By Mouth Four Times A Day As Needed Nausea  Allergies (verified): 1)  ! * Contrast Dye  Past History:  Past Medical History: Reviewed history from 09/07/2007 and no changes required. Hx of TINEA PEDIS (ICD-110.4) GASTROESOPHAGEAL REFLUX DISEASE (ICD-530.81) PLANTAR FASCIITIS, BILATERAL (ICD-728.71)    Past Surgical History: Reviewed history from 03/21/2010 and no changes required. R groin ?TB infection surgery - remote completed in [place] Right foot surgery-completed in [place]  Family History: Reviewed history from 02/12/2010 and no changes required. Unknown  Social History: Reviewed history from 03/05/2010 and no changes required. Occupation: [employer] Single Never Smoked Alcohol use-no Drug use-no Regular exercise-yes  Review of Systems       The patient complains of back pain.  The patient denies allergy/sinus, anemia, anxiety-new, arthritis/joint pain, blood in urine, breast changes/lumps, change in vision, confusion, cough, coughing up blood, depression-new, fainting, fatigue, fever, headaches-new,  hearing problems, heart murmur, heart rhythm changes, itching, muscle pains/cramps, night sweats, nosebleeds, shortness of breath, skin rash, sleeping problems, sore throat, swelling of feet/legs, swollen lymph glands, thirst - excessive, urination - excessive, urination changes/pain, urine leakage, vision changes, and voice change.         Pertinent positive and negative  review of systems were noted in the above HPI. All other ROS was otherwise negative.   Vital Signs:  Patient profile:   30 year old male Height:      71 inches Weight:      209 pounds BMI:     29.25 BSA:     2.15 Pulse rate:   88 / minute Pulse rhythm:   regular BP sitting:   120 / 76  (left arm) Cuff size:   regular  Vitals Entered By: Ok Anis CMA (Mar 26, 2010 10:54 AM)  Physical Exam  General:  Well developed, well nourished, no acute distress. Eyes:  PERRLA, no icterus. Mouth:  No deformity or lesions, dentition normal. Neck:  no cervical or supraclavicular adenopathy. Chest Wall:  no scars. Lungs:  Clear throughout to auscultation. Heart:  Regular rate and rhythm; no murmurs, rubs,  or bruits. Abdomen:  soft abdomen with tenderness in right lower quadrant and normoactive bowel sounds. Right groin somewhat tender but there is no hernia and no adenopathy. There is no CVA tenderness. Liver edge at costal margin. Msk:  right leg edema of the thigh scar medial aspect of the right thigh about 1 inch. There is also a scar on plantar aspect of his right foot. Extremities:  1+ pitting edema of the right leg, peripheral pulses were active. Skin:  Intact without significant lesions or rashes. Psych:  Alert and cooperative. Normal mood and affect.   Impression & Recommendations:  Problem # 1:  THIGH PAIN (ICD-729.5) Patient's right leg pain and abdominal pain seems to coincide. They seem to be related to the swelling of the right leg. This could be due to lymphedema or some obstruction of the lymphatic channels or possibly due to scar tissue. The back pain may be referred. We need to rule out TB of the spine or TB of his terminal ileum. Although he was treated with TB medications 13 years ago. I believe that followup with infectious disease would be reasonable at this time. Orders: [facility] (MCID) TLB-IgA (Immunoglobulin A) (82784-IGA) TLB-IgG (Immunoglobulin  G) (82784-IGG)  Problem # 2:  DUODENITIS (ICD-535.60) We need to rule tropical or nontropical ( celiac)  sprue. We will obtain tissue transglutaminase levels, IgG and IgA today.  Problem # 3:  WEIGHT LOSS-ABNORMAL (ICD-783.21) Patient's weight loss has stabilized at 209 pounds.  Other Orders: T-Tissue Transglutamase Ab IgA [phone]) TLB-Sedimentation Rate (ESR) (85652-ESR) TLB-CRP-High Sensitivity (C-Reactive Protein) (86140-FCRP)  Patient Instructions: 1)  Please go to the basement to have your labwork (CRC, tTg. Sedimentation rate, IgA, IgG) drawn today. 2)  Pick up your prescription for Miralax, Reglan and Dulcolax at the pharmacy. 3)  Pick up lasix at the pharmacy. We have already sent the prescription electronically. 4)  You have been given a script for Thigh High Support Stockings. You can take this to a medical supply store in [place] and they should be able to get this for you. 5)  You have been scheduled for a colonoscopy on 03/27/10 @ 2:30 pm at [facility] floor. Please arrive at 1:30 pm for your scheduled test. 6)  We will schedule you to see Dr Orvan Falconer and will  call you when this has been set up. 7)  Copy sent to : Dr Cliffton Asters, Dr Trinna Post Plotnikov 8)  The medication list was reviewed and reconciled.  All changed / newly prescribed medications were explained.  A complete medication list was provided to the patient / caregiver. Prescriptions: LASIX 20 MG TABS (FUROSEMIDE) Take 1 tablet by mouth once a day  #30 x 0   Entered by:   Lamona Curl CMA (AAMA)   Authorized by:   Hart Carwin MD   Signed by:   Lamona Curl CMA (AAMA) on 03/26/2010   Method used:   Electronically to        [facility] [address] [id number]* (retail)       [address] [place], [place]  [address]       Ph: [phone]       Fax: [phone]   RxID:   [id number] DULCOLAX 5 MG  TBEC (BISACODYL) Day before procedure take 2 at 3pm and 2 at 8pm.  #4 x 0   Entered by:    Lamona Curl CMA (AAMA)   Authorized by:   Hart Carwin MD   Signed by:   Lamona Curl CMA (AAMA) on 03/26/2010   Method used:   Electronically to        [facility] [id number]* (retail)       [address] [place], [place]  [address]       Ph: [phone]       Fax: [phone]   RxID:   [id number] REGLAN 10 MG  TABS (METOCLOPRAMIDE HCL) As per prep instructions.  #2 x 0   Entered by:   Lamona Curl CMA (AAMA)   Authorized by:   Hart Carwin MD   Signed by:   Lamona Curl CMA (AAMA) on 03/26/2010   Method used:   Electronically to        [id number]* (retail)       [address] [place], [place]  [address]       Ph: [phone]       Fax: [phone]   RxID:   [id number] MIRALAX   POWD (POLYETHYLENE GLYCOL 3350) As per prep  instructions.  #255gm x 0   Entered by:   Lamona Curl CMA (AAMA)   Authorized by:   Hart Carwin MD   Signed by:   Lamona Curl CMA (AAMA) on 03/26/2010   Method used:   Electronically to        [id number]* (retail)       [address] [place], [place]  [address]       Ph: [phone]       Fax: [phone]   RxID:   [id number] THIGH HIGH SUPPORT STOCKING Use as directed  #1 stocking x 0   Entered by:   Lamona Curl CMA (AAMA)   Authorized by:   Hart Carwin MD   Signed by:   Lamona Curl CMA (AAMA) on 03/26/2010   Method used:   Print then Give to Patient   RxID:   [id number]

## 2010-12-11 NOTE — Progress Notes (Signed)
Summary: Medication  Phone Note Call from Patient Call back at Home Phone [phone]   Caller: Patient Call For: Dr. Juanda Chance Reason for Call: Refill Medication Summary of Call: [facility] [address] Initial call taken by: Karna Christmas,  June 07, 2010 12:27 PM  Follow-up for Phone Call        Patient advised again that prescription for aciphex was sent to [facility] on 05/24/10 at his office visit. Follow-up by: Lamona Curl CMA (AAMA),  June 07, 2010 1:21 PM

## 2010-12-11 NOTE — Miscellaneous (Signed)
  Clinical Lists Changes  Medications: Added new medication of TRAMADOL HCL 50 MG TABS (TRAMADOL HCL) take 1-2 tablets every 6 hours as needed for pain - Signed Rx of TRAMADOL HCL 50 MG TABS (TRAMADOL HCL) take 1-2 tablets every 6 hours as needed for pain;  #120 x 2;  Signed;  Entered by: Starleen Arms CMA;  Authorized by: Paulette Blanch Dam MD;  Method used: Electronically to [id number]*, [address] [place], [place]  [address], Ph: [phone], Fax: [phone]    Prescriptions: TRAMADOL HCL 50 MG TABS (TRAMADOL HCL) take 1-2 tablets every 6 hours as needed for pain  #120 x 2   Entered by:   Starleen Arms CMA   Authorized by:   Acey Lav MD   Signed by:   Starleen Arms CMA on 08/22/2010   Method used:   Electronically to        [facility] #[id number]* (retail)       [address] [place], [place]  [address]       Ph: [phone]       Fax: [phone]   RxID:   [id number]

## 2010-12-11 NOTE — Miscellaneous (Signed)
Summary: HIPAA Restrictions  HIPAA Restrictions   Imported By: Florinda Marker 05/02/2010 15:14:19  _____________________________________________________________________  External Attachment:    Type:   Image     Comment:   External Document

## 2010-12-11 NOTE — Assessment & Plan Note (Signed)
Summary: STOMACH PAIN DR AVP PT/NO SLOT  STC   Vital Signs:  Patient profile:   30 year old male Weight:      206 pounds O2 Sat:      98 % on Room air Temp:     98.0 degrees F oral Pulse rate:   60 / minute Pulse rhythm:   regular Resp:     16 per minute BP sitting:   112 / 78  (left arm) Cuff size:   large  Vitals Entered By: Rock Nephew CMA (March 05, 2010 9:44 AM)  O2 Flow:  Room air CC: abdominal pain x 28yr, Abdominal pain Is Patient Diabetic? No Pain Assessment Patient in pain? yes     Location: abdomen   Primary Care Provider:  Georgina Quint Plotnikov MD  CC:  abdominal pain x 70yr and Abdominal pain.  History of Present Illness: New to me is this [place] male with one year hx. of Bilateral lower quadrant abd. pain.  Abdominal Pain      This is a 30 year old man who presents with Abdominal pain.  The symptoms began >1 year ago.  On a scale of 1 to 10, the intensity is described as a 2.  The patient denies nausea, vomiting, diarrhea, constipation, melena, hematochezia, anorexia, and hematemesis.  The location of the pain is right lower quadrant and left lower quadrant.  The pain is described as burning in quality.  The patient denies the following symptoms: fever, weight loss, dysuria, chest pain, jaundice, and dark urine.  The pain is better with defecation.  He feels bloated intermittently.  Preventive Screening-Counseling & Management  Alcohol-Tobacco     Alcohol drinks/day: 0     Smoking Status: never  Caffeine-Diet-Exercise     Does Patient Exercise: yes  Hep-HIV-STD-Contraception     Hepatitis Risk: no risk noted     HIV Risk: no risk noted     STD Risk: no risk noted     TSE monthly: yes     Testicular SE Education/Counseling to perform regular STE      Sexual History:  currently monogamous.        Drug Use:  never and no.        Blood Transfusions:  no.    Clinical Review Panels:  Diabetes Management   Creatinine:  0.9 (02/12/2010)  CBC  WBC:  8.7 (02/12/2010)   RBC:  5.74 (02/12/2010)   Hgb:  16.1 (02/12/2010)   Hct:  45.8 (02/12/2010)   Platelets:  194.0 (02/12/2010)   MCV  79.9 (02/12/2010)   MCHC  35.2 (02/12/2010)   RDW  14.8 (02/12/2010)   PMN:  56.7 (02/12/2010)   Lymphs:  24.1 (02/12/2010)   Monos:  12.3 (02/12/2010)   Eosinophils:  6.2 (02/12/2010)   Basophil:  0.7 (02/12/2010)  Complete Metabolic Panel   Glucose:  87 (02/12/2010)   Sodium:  145 (02/12/2010)   Potassium:  3.7 (02/12/2010)   Chloride:  108 (02/12/2010)   CO2:  29 (02/12/2010)   BUN:  10 (02/12/2010)   Creatinine:  0.9 (02/12/2010)   Albumin:  4.1 (02/12/2010)   Total Protein:  7.5 (02/12/2010)   Calcium:  9.1 (02/12/2010)   Total Bili:  0.6 (02/12/2010)   Alk Phos:  107 (02/12/2010)   SGPT (ALT):  42 (02/12/2010)   SGOT (AST):  32 (02/12/2010)   Current Medications (verified): 1)  Vitamin D 1000 Unit Tabs (Cholecalciferol) .Marland Kitchen.. 1 By Mouth Qd 2)  Promethazine-Codeine 6.25-10 Mg/14ml Syrp (Promethazine-Codeine) .Marland KitchenMarland KitchenMarland Kitchen  5-10 Ml By Mouth Q Id As Needed Cough  Allergies (verified): 1)  ! * Contrast Dye  Past History:  Past Medical History: Reviewed history from 09/07/2007 and no changes required. Hx of TINEA PEDIS (ICD-110.4) GASTROESOPHAGEAL REFLUX DISEASE (ICD-530.81) PLANTAR FASCIITIS, BILATERAL (ICD-728.71)    Past Surgical History: Reviewed history from 02/12/2010 and no changes required. R groin ?TB infection surgery - remote  Family History: Reviewed history from 02/12/2010 and no changes required. Unknown  Social History: Reviewed history from 02/12/2010 and no changes required. 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 Alcohol use-no Drug use-no Regular exercise-yes Drug Use:  never, no Does Patient Exercise:  yes Hepatitis Risk:  no risk noted HIV Risk:  no risk noted STD Risk:  no risk noted Sexual History:  currently monogamous Blood Transfusions:  no  Review of Systems       The patient  complains of weight gain, abdominal pain, and severe indigestion/heartburn.  The patient denies anorexia, fever, weight loss, chest pain, syncope, dyspnea on exertion, peripheral edema, prolonged cough, headaches, hemoptysis, melena, hematochezia, hematuria, genital sores, suspicious skin lesions, abnormal bleeding, enlarged lymph nodes, angioedema, and testicular masses.   GU:  Denies decreased libido, discharge, dysuria, erectile dysfunction, genital sores, hematuria, incontinence, nocturia, urinary frequency, and urinary hesitancy.  Physical Exam  General:  alert, well-developed, well-nourished, well-hydrated, appropriate dress, normal appearance, healthy-appearing, cooperative to examination, and good hygiene.   Head:  normocephalic, atraumatic, no abnormalities observed, and no abnormalities palpated.   Eyes:  No icterus, pink/moist mm.,  Ears:  R ear normal and L ear normal.   Mouth:  Oral mucosa and oropharynx without lesions or exudates.  Teeth in good repair. Neck:  supple, full ROM, no masses, no thyromegaly, no thyroid nodules or tenderness, no JVD, normal carotid upstroke, and no cervical lymphadenopathy.   Lungs:  Normal respiratory effort, chest expands symmetrically. Lungs are clear to auscultation, no crackles or wheezes. Heart:  Normal rate and regular rhythm. S1 and S2 normal without gallop, murmur, click, rub or other extra sounds. Abdomen:  soft, non-tender, normal bowel sounds, no distention, no masses, no guarding, no rigidity, no rebound tenderness, no abdominal hernia, no inguinal hernia, no hepatomegaly, and no splenomegaly.   Rectal:  no external abnormalities, no hemorrhoids, normal sphincter tone, no masses, no tenderness, no fissures, no fistulae, no perianal rash, and stool positive for occult blood.   Genitalia:  circumcised, no hydrocele, no varicocele, no scrotal masses, no testicular masses or atrophy, no cutaneous lesions, and no urethral discharge.   Prostate:   Prostate gland firm and smooth, no enlargement, nodularity, tenderness, mass, asymmetry or induration. Msk:  No deformity or scoliosis noted of thoracic or lumbar spine.   Pulses:  R and L carotid,radial,femoral,dorsalis pedis and posterior tibial pulses are full and equal bilaterally Extremities:  No clubbing, cyanosis, edema, or deformity noted with normal full range of motion of all joints.   Neurologic:  No cranial nerve deficits noted. Station and gait are normal. Plantar reflexes are down-going bilaterally. DTRs are symmetrical throughout. Sensory, motor and coordinative functions appear intact.   Impression & Recommendations:  Problem # 1:  BLOOD IN STOOL (ICD-578.1) Assessment New will refer to GI, he may need upper and/or lower endoscopy, will start a PPI. Orders: TLB-CRP-High Sensitivity (C-Reactive Protein) (86140-FCRP) TLB-Sedimentation Rate (ESR) (85652-ESR) Gastroenterology Referral (GI) Hemoccult Guaiac-1 spec.(in office) (82270)  Problem # 2:  ABDOMINAL PAIN OTHER SPECIFIED SITE (ICD-789.09) Assessment: New labs to look for organ pathology, anemia, infection. will  check plain films to look for sbo, constipation, stones, free air, etc. Orders: T-Abdomen 2-view (74020TC) Venipuncture (96045) TLB-Lipid Panel (80061-LIPID) TLB-BMP (Basic Metabolic Panel-BMET) (80048-METABOL) TLB-CBC Platelet - w/Differential (85025-CBCD) TLB-Hepatic/Liver Function Pnl (80076-HEPATIC) TLB-TSH (Thyroid Stimulating Hormone) (84443-TSH) TLB-Amylase (82150-AMYL) TLB-Lipase (83690-LIPASE) TLB-Udip w/ Micro (81001-URINE) TLB-CRP-High Sensitivity (C-Reactive Protein) (86140-FCRP) TLB-Sedimentation Rate (ESR) (85652-ESR) Gastroenterology Referral (GI) Hemoccult Guaiac-1 spec.(in office) (40981)  Discussed use of medications, application of heat or cold, and exercises.   Problem # 3:  GERD (ICD-530.81) Assessment: New  His updated medication list for this problem includes:    Levbid 0.375  Mg Xr12h-tab (Hyoscyamine sulfate) ..... One by mouth two times a day for abd. pain and bloating    Prilosec 20 Mg Cpdr (Omeprazole) ..... One by mouth once daily for heartburn  Labs Reviewed: Hgb: 16.1 (02/12/2010)   Hct: 45.8 (02/12/2010)  Orders: Hemoccult Guaiac-1 spec.(in office) (82270)  Complete Medication List: 1)  Vitamin D 1000 Unit Tabs (Cholecalciferol) .Marland Kitchen.. 1 by mouth qd 2)  Promethazine-codeine 6.25-10 Mg/54ml Syrp (Promethazine-codeine) .... 5-10 ml by mouth q id as needed cough 3)  Levbid 0.375 Mg Xr12h-tab (Hyoscyamine sulfate) .... One by mouth two times a day for abd. pain and bloating 4)  Prilosec 20 Mg Cpdr (Omeprazole) .... One by mouth once daily for heartburn  Patient Instructions: 1)  Please schedule a follow-up appointment in 2 weeks. 2)  Avoid foods high in acid (tomatoes, citrus juices, spicy foods). Avoid eating within two hours of lying down or before exercising. Do not over eat; try smaller more frequent meals. Elevate head of bed twelve inches when sleeping. Prescriptions: PRILOSEC 20 MG CPDR (OMEPRAZOLE) One by mouth once daily for heartburn  #30 x 11   Entered and Authorized by:   Etta Grandchild MD   Signed by:   Etta Grandchild MD on 03/05/2010   Method used:   Print then Give to Patient   RxID:   [id number] LEVBID 0.375 MG XR12H-TAB (HYOSCYAMINE SULFATE) One by mouth two times a day for abd. pain and bloating  #60 x 2   Entered and Authorized by:   Etta Grandchild MD   Signed by:   Etta Grandchild MD on 03/05/2010   Method used:   Print then Give to Patient   RxID:   [id number]

## 2010-12-11 NOTE — Assessment & Plan Note (Signed)
Summary: cosyntropin injection/see phone note/SD  Nurse Visit   Allergies: 1)  ! * Contrast Dye  Medication Administration  Injection # 1:    Medication: Cosyntropin Inj.    Diagnosis: ABDOMINAL PAIN OTHER SPECIFIED SITE [phone])    Route: IM    Site: L deltoid    Exp Date: 06/12/2011    Lot #: [id number]    Mfr: GenereMedix    Patient tolerated injection without complications    Given by: Lanier Prude, CMA(AAMA) (August 16, 2010 9:38 AM)  Orders Added: 1)  Admin of Therapeutic Inj  intramuscular or subcutaneous [96372] 2)  Cosyntropin Inj. [J0835]

## 2010-12-11 NOTE — Letter (Signed)
Summary: Patient Notice- Colon Biospy Results  [facility]  [address] [place], [place] [address]   Phone: [phone]  Fax: [phone]        Mar 30, 2010 MRN: [id number]    [facility] [address] [name], [place]  [address]    Dear Mr. BRINDLE,  I am pleased to inform you that the biopsies taken during your recent colonoscopy did not show any evidence of cancer upon pathologic examination.The biopsies from Your colon showed normal tissue, no evidence of TB  Additional information/recommendations:  __No further action is needed at this time.  Please follow-up with      your primary care physician for your other healthcare needs.  _x_Please call [phone] to schedule a return visit to review      your condition.  __Continue with the treatment plan as outlined on the day of your      exam.  _Please keep Your appointment with Dr Orvan Falconer and wear Your support stocking to kee Your leg from swelling  Please call us if you are having persistent problems or have questions about your condition that have not been fully answered at this time.  Sincerely,  Hart Carwin MD   This letter has been electronically signed by your physician.  Appended Document: Patient Notice- Colon Biospy Results letter mailed

## 2010-12-11 NOTE — Assessment & Plan Note (Signed)
Summary: new pt lympadema/?tb   Referring Provider:  Tresa Garter MD Primary Provider:  Tresa Garter MD  CC:  new patient / lympadema / ?tb.  History of Present Illness: 30 yo M from [place] moved to [place] 04-2000.  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 He was treated with 6 months of therapy while in [place]  (he is unable to remember the names of his medicines, believes he took 3 or 4 meds). He was prev eval by ID in 2001 for R leg edema which was felt to be chronic and that he did not have active TB.  he has worsening abd pain and RLE pain and night sweats. Has been having worsening RLE edema, esp after exertion.    He had f/u CT scan 03-09-10: Unremarkable abdominal pelvic appearance.  No focal bowel abnormality suggested.  Irregular area of soft tissue density in the medial anterior thigh with adjacent associated skin thickening with no enhancement. The appearance suggests scarring/fibrosis.  Recently had negative HIV/Hep C/RPR. Hep B Suuface Ab+ and Core Ab+    Preventive Screening-Counseling & Management  Alcohol-Tobacco     Alcohol drinks/day: 0     Smoking Status: never  Caffeine-Diet-Exercise     Caffeine use/day: 0     Does Patient Exercise: yes  Safety-Violence-Falls     Seat Belt Use: yes   Updated Prior Medication List: BENTYL 10 MG CAPS (DICYCLOMINE HCL) Take 1 tab twice daily as needed PROMETHAZINE HCL 25 MG TABS (PROMETHAZINE HCL) 1-2 by mouth four times a day as needed nausea * THIGH HIGH SUPPORT STOCKING Use as directed LASIX 20 MG TABS (FUROSEMIDE) Take 1 tablet by mouth once a day NEXIUM 40 MG CPDR (ESOMEPRAZOLE MAGNESIUM) 1 by mouth qam ( medically necessary)  Current Allergies (reviewed today): ! 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   Vital Signs:  Patient profile:   30 year old male Height:      71 inches (180.34 cm) Weight:      206.8 pounds (94 kg) BMI:     28.95 Temp:     98.0 degrees F (36.67 degrees C) oral Pulse rate:   59 / minute BP sitting:   112 / 72  (right arm)  Vitals Entered By: Baxter Hire) (May 02, 2010 1:58 PM) CC: new patient / lympadema / ?tb Pain Assessment Patient in pain? yes     Location: right leg Intensity: 6 Type: burning, aching Onset of pain  Constant Nutritional Status BMI of 25 - 29 = overweight Nutritional Status Detail appetite is alright per patient  Does patient need assistance? Functional Status Self care Ambulation Normal    Impression & Recommendations:  Problem # 1:  NIGHT SWEATS (ICD-780.8)  the source of this is unclear. 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 Will ask IR to see if they can biopsy a lymphnode in his R leg. will also check a quantiferon gold. return to clinic 2-3 weeks.   Orders: Consultation Level IV [phone]) Radiology Referral (Radiology) T- * Misc. Laboratory test [phone])  Problem # 2:  TUBERCULOSIS, HX OF (ICD-V12.01)  no records are available of this tratement. we are somewhat flying blind.   Orders: Consultation Level IV [phone]) Radiology Referral (Radiology) T- * Misc. Laboratory test [phone])  Problem # 3:  NAUSEA (ICD-787.02) will change his phenergan to zofran odt in am  His updated medication list for this problem includes:    Zofran Odt 4 Mg Tbdp (Ondansetron) .Marland Kitchen... 1 tab by mouth every morning as needed for nausea  Medications Added to Medication List This Visit: 1)  Zofran Odt 4 Mg Tbdp (Ondansetron) .Marland Kitchen.. 1 tab by mouth every morning as needed for nausea Prescriptions: ZOFRAN ODT 4 MG TBDP (ONDANSETRON) 1 tab by mouth every morning as needed for  nausea  #30 x 1   Entered and Authorized by:   Johny Sax MD   Signed by:   Johny Sax MD on 05/02/2010   Method used:   Electronically to        [facility] [address] [id number] (retail)       [address] [place], [place]  [address]       Ph: [phone]       Fax: [phone]   RxID:   [id number]

## 2010-12-11 NOTE — Progress Notes (Signed)
Summary: TRIAGE  Phone Note Call from Patient Call back at Home Phone [phone]   Caller: Patient Call For: Juanda Chance Reason for Call: Talk to Nurse Summary of Call: Patient wants to be seen sooner than first available 7-21 for stomach pain and vomiting. Initial call taken by: Tawni Levy,  May 07, 2010 9:10 AM  Follow-up for Phone Call        Pt. states"I am very,very sick. I want to see Dr.Javione Gunawan!"  C/O 2 days of epigastric pain, vomiting despite Zofran. Thinks he has a low grade temp. Saw blood in vomit x3.  Denies constipation, diarrhea, blood,black stools.   1) see Amy Esterwood PAC on 05-07-10 at 3:30pm 2) liquids only 3) continue zofran 4) tylenol/Ibuprofen as needed 5) If symptoms become worse call back immediately or go to ER.  Follow-up by: Laureen Ochs LPN,  May 07, 2010 10:05 AM

## 2010-12-11 NOTE — Assessment & Plan Note (Signed)
Summary: 2 wk f/u / #cd   Vital Signs:  Patient profile:   30 year old male Height:      71 inches Weight:      198 pounds BMI:     27.72 Temp:     98.8 degrees F oral Pulse rate:   76 / minute Pulse rhythm:   regular Resp:     16 per minute BP sitting:   100 / 60  (left arm) Cuff size:   regular  Vitals Entered By: Lanier Prude, Beverly Gust) (October 12, 2010 3:40 PM) CC: 2 wk f/u  Is Patient Diabetic? No   Primary Care Shavonne Ambroise:  Tresa Garter MD  CC:  2 wk f/u .  History of Present Illness: The patient presents for a follow up of  abd pain,  n/v, bloating  and tiredness - not better.  Current Medications (verified): 1)  Vitamin D 1000 Unit Tabs (Cholecalciferol) .Marland Kitchen.. 1 By Mouth Qd 2)  Amitiza 24 Mcg Caps (Lubiprostone) .Marland Kitchen.. 1 By Mouth Once Daily As Needed Constipation 3)  Zofran Odt 4 Mg Tbdp (Ondansetron) .... Take Every Six Hours As Needed 4)  Milk of Magnesia 400 Mg/81ml Susp (Magnesium Hydroxide) .... Use As Dirrected For Constipation 5)  Tramadol Hcl 50 Mg Tabs (Tramadol Hcl) .... Take 1-2 Tablets Every 6 Hours As Needed For Pain 6)  Dexilant 60 Mg Cpdr (Dexlansoprazole) .... One By Mouth Once Daily For Heartburn 7)  Cortef 10 Mg Tabs (Hydrocortisone) .Marland Kitchen.. 1 By Mouth in Am and 1 At Lunch 8)  Buspirone Hcl 15 Mg Tabs (Buspirone Hcl) .... Start With 1/2 Tab Two Times A Day Then 1 By Mouth Two Times A Day For Anxiety 9)  Miralax  Powd (Polyethylene Glycol 3350) .... Take 1 Capful (17 Grams) Dissolved in At Least 8 Ounces of Water/juice and Drink Twice Daily. May Titrate Dose As Needed  Allergies (verified): 1)  ! * Contrast Dye  Physical Exam  General:  Non-toxic, alert, well-developed, well-nourished, well-hydrated, appropriate dress, looks tired and slow, cooperative to examination, and with good hygiene.   Eyes:  no icterus, pink/moist mm., Mouth:  good dentition, no gingival abnormalities, no dental plaque, pharynx pink and moist, no erythema, no posterior  lymphoid hypertrophy, no lesions, no tongue abnormalities, no leukoplakia, and no petechiae.   Lungs:  Normal respiratory effort, chest expands symmetrically. Lungs are clear to auscultation, no crackles or wheezes. Heart:  Normal rate and regular rhythm. S1 and S2 normal without gallop, murmur, click, rub or other extra sounds. Abdomen:  soft, sensitive, normal bowel sounds, no distention, no masses, no guarding, no rigidity, no rebound tenderness, no abdominal hernia, no inguinal hernia, no hepatomegaly, and no splenomegaly.   Msk:  No deformity or scoliosis noted of thoracic or lumbar spine.   Neurologic:  No cranial nerve deficits noted. Station and gait are normal. Plantar reflexes are down-going bilaterally. DTRs are symmetrical throughout. Sensory, motor and coordinative functions appear intact. Skin:  turgor normal, color normal, no rashes, no suspicious lesions, no ecchymoses, no petechiae, and no purpura.   Psych:  Oriented X3, memory intact for recent and remote, normally interactive, good eye contact, not anxious appearing, not depressed appearing, not agitated, not suicidal, and not homicidal.     Impression & Recommendations:  Problem # 1:  NAUSEA AND VOMITING (ICD-787.01) related to #2 Assessment Unchanged S/p extensive negative GI eval by Dr Juanda Chance  Problem # 2:  ABDOMINAL PAIN-PERIUMBILICAL (ICD-789.05) Assessment: Unchanged Empiric Mebendazole in Dec His  updated medication list for this problem includes:    Amitiza 24 Mcg Caps (Lubiprostone) .Marland Kitchen... 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 1 by mouth bid  Problem # 5:  ANXIETY (ICD-300.00) Assessment: Improved  His updated medication list for this  problem includes:    Buspirone Hcl 15 Mg Tabs (Buspirone hcl) .Marland Kitchen... 1 by mouth two times a day for anxiety  Complete Medication List: 1)  Vitamin D 1000 Unit Tabs (Cholecalciferol) .Marland Kitchen.. 1 by mouth qd 2)  Amitiza 24 Mcg Caps (Lubiprostone) .Marland Kitchen.. 1 by mouth once daily as needed constipation 3)  Zofran Odt 4 Mg Tbdp (Ondansetron) .... Take every six hours as needed 4)  Tramadol Hcl 50 Mg Tabs (Tramadol hcl) .... Take 1-2 tablets every 6 hours as needed for pain 5)  Buspirone Hcl 15 Mg Tabs (Buspirone hcl) .Marland Kitchen.. 1 by mouth two times a day for anxiety 6)  Miralax Powd (Polyethylene glycol 3350) .... Take 1 capful (17 grams) dissolved in at least 8 ounces of water/juice and drink twice daily. may titrate dose as needed 7)  Aciphex 20 Mg Tbec (Rabeprazole sodium) .Marland Kitchen.. 1 by mouth 8)  Augmentin 875-125 Mg Tabs (Amoxicillin-pot clavulanate) .Marland Kitchen.. 1 by mouth bid 9)  Mebendazole 100 Mg Chew (Mebendazole) .Marland Kitchen.. 1 by mouth two times a day - start on dec 20th.  Patient Instructions: 1)  Please schedule a follow-up appointment in 6 weeks Prescriptions: MEBENDAZOLE 100 MG CHEW (MEBENDAZOLE) 1 by mouth two times a day - START on Dec 20th.  #10 x 1   Entered and Authorized by:   Tresa Garter MD   Signed by:   Tresa Garter MD on 10/12/2010   Method used:   Print then Give to Patient   RxID:   [id number] AUGMENTIN 875-125 MG TABS (AMOXICILLIN-POT CLAVULANATE) 1 by mouth bid  #28 x 0   Entered and Authorized by:   Tresa Garter MD   Signed by:   Tresa Garter MD on 10/12/2010   Method used:   Print then Give to Patient   RxID:   [id number] ACIPHEX 20 MG TBEC (RABEPRAZOLE SODIUM) 1 by mouth  #30 x 12   Entered and Authorized by:   Tresa Garter MD   Signed by:   Tresa Garter MD on 10/12/2010   Method used:   Print then Give to Patient   RxID:   [id number]    Orders Added: 1)  Est. Patient Level IV [53664]

## 2010-12-11 NOTE — Procedures (Signed)
Summary: Colonoscopy  Patient: Cebastian Neis Note: All result statuses are Final unless otherwise noted.  Tests: (1) Colonoscopy (COL)   COL Colonoscopy           [facility]      [facility]     [address]     [place], [place]  [address]           COLONOSCOPY PROCEDURE REPORT           PATIENT:  Eduard, Penkala  MR#:  [id number]     BIRTHDATE:  July 19, 1981, 29 yrs. old  GENDER:  male     ENDOSCOPIST:  Hedwig Morton. Juanda Chance, MD     REF. BY:  Linda Hedges. Plotnikov, M.D.     PROCEDURE DATE:  03/27/2010     PROCEDURE:  Colonoscopy 32440     ASA CLASS:  Class I     INDICATIONS:  abdominal pain RLQ abd. pain, radiating from the     groin, right leg edema, CT scan 2001 adenopathy, current CT scan     no adenopathy, hx TB right foot and groin 1990'ies in [place]     MEDICATIONS:   Versed, Versed 10 mg, Fentanyl 75 mcg, Benadryl     12.5 mg           DESCRIPTION OF PROCEDURE:   After the risks benefits and     alternatives of the procedure were thoroughly explained, informed     consent was obtained.  Digital rectal exam was performed and     revealed no rectal masses.   The LB [id number] endoscope     was introduced through the anus and advanced to the cecum, which     was identified by both the appendix and ileocecal valve, without     limitations.  The quality of the prep was good, using MiraLax.     The instrument was then slowly withdrawn as the colon was fully     examined.     <<PROCEDUREIMAGES>>           FINDINGS:  No polyps or cancers were seen. unablr to intubate TI,     ileocecal valve appears normal With standard forceps, biopsy was     obtained and sent to pathology (see image1, image2, and image3).     random colon biopsies to r/o granulomas   Retroflexed views in the     rectum revealed no abnormalities.    The scope was then withdrawn     from the patient and the procedure completed.           COMPLICATIONS:  None     ENDOSCOPIC IMPRESSION:     1) No polyps or cancers  2) Normal colonoscopy     RECOMMENDATIONS:     continue Lasix 20 mg/day for right leg swelling, wear support     hose right leg,     continue Miralax for contipation,     limit salt intake     we will make an appointment for You to see Dr Orvan Falconer     REPEAT EXAM:  In 0 year(s) for.           ______________________________     Hedwig Morton. Juanda Chance, MD           CC:           n.     eSIGNED:   Hedwig Morton. Hatcher Froning at 03/27/2010 03:23 PM  Paula, Zietz, [phone]  Note: An exclamation mark (!) indicates a result that was not dispersed into the flowsheet. Document Creation Date: 03/29/2010 5:07 PM _______________________________________________________________________  (1) Order result status: Final Collection or observation date-time: 03/27/2010 15:07 Requested date-time:  Receipt date-time:  Reported date-time:  Referring Physician:   Ordering Physician: Lina Sar [id number]) Specimen Source:  Source: Launa Grill Order Number: [id number] Lab site:

## 2010-12-11 NOTE — Letter (Signed)
Summary: Appt Reminder 2  [facility]  [address] [place], [place] [address]   Phone: [phone]  Fax: [phone]        Mar 19, 2010 MRN: [id number]    [facility] [address] [name], [place]  [address]    Dear Mr. HYLE,   You have an appointment with Dr.Dora Juanda Chance on 03-26-10 at 10:45am. Please remember to bring a complete list of the medicines you are taking, your insurance card and your co-pay.  If you have to cancel or reschedule this appointment, please call before 5:00 pm the evening before to avoid a cancellation fee.  If you have any questions or concerns, please call [phone].    Sincerely,    Laureen Ochs LPN  Appended Document: Appt Reminder 2 Letter mailed to patient.

## 2010-12-11 NOTE — Progress Notes (Signed)
Summary: referral info  Phone Note Call from Patient Call back at Home Phone [phone]   Caller: Patient Call For: Dr. Juanda Chance Reason for Call: Talk to Nurse Summary of Call: needs referral information again to [facility] Initial call taken by: Vallarie Mare,  May 22, 2010 12:44 PM  Follow-up for Phone Call        I have left a message for the patient to call back. Dottie Nelson-Smith CMA Duncan Dull)  May 22, 2010 2:33 PM   Patient requests the number for Dr Ninetta Lights at [facility]. Number given to patient. Dottie Nelson-Smith CMA Duncan Dull)  May 22, 2010 5:23 PM

## 2010-12-11 NOTE — Assessment & Plan Note (Signed)
Summary: F/U OV/OK PER DR VAN DAM/VS   Visit Type:  Follow-up Referring Provider:  na Primary Provider:  Tresa Garter MD  CC:  f/u .  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 He had FNA by IR which showed benign reactive tiss ue without granuLOMAS.  His AFB ,fungal and bacterial cultures without growht. He continues to have pain in his thigh and groin. I performed biopsy on  hyperpigmented area on his foot which was unrevealing and fungal and afb cultures have been negative so far. He was briefly started on hydrocortisone for possible AI for his GI ssx bu this faield to improve ssx and it was stopped. He has since seen Lina Sar who feels his abdomina pain is due to constipation. he returns for followup today and again really thinks that he needs TB treatment. His weight has been stable and he has been witout fevers though he endorses sweating at night. I spent greater than 45 minutes iwth this pt including greater than 50% of time coordinating care and in face to face counselling of the pt.  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) 10)  Inguinal Lymphadenopathy, Right  (ICD-785.6) 11)  Back Pain, Lumbar  (ICD-724.2) 12)  Leg  Pain, Right  (ICD-729.5) 13)  Upper Respiratory Infection, Acute  (ICD-465.9) 14)  Lymphedema, Right Leg  (ICD-457.1) 15)  Insomnia, Chronic  (ICD-307.42) 16)  Abdominal Pain-epigastric  (ICD-789.06) 17)  Nausea and Vomiting  (ICD-787.01) 18)  Tuberculosis  (ICD-011.90) 19)  Night Sweats  (ICD-780.8) 20)  Nausea  (ICD-787.02) 21)  Thigh Pain  (ICD-729.5) 22)  Duodenitis  (ICD-535.60) 23)  Fecal Occult Blood  (ICD-792.1) 24)  Dysphagia  (ICD-787.29) 25)  Heartburn  (ICD-787.1) 26)  Weight Loss-abnormal  (ICD-783.21) 27)  Abdominal Pain-periumbilical  (ICD-789.05) 28)  Weight Loss  (ICD-783.21) 29)  Dysphagia Unspecified  (ICD-787.20) 30)  Gerd  (ICD-530.81) 31)  Blood in Stool  (ICD-578.1) 32)  Abdominal Pain Other Specified Site  (ICD-789.09) 33)  Arthralgia  (ICD-719.40) 34)  Bronchitis, Acute  (ICD-466.0) 35)  Hx of Tinea Pedis  (ICD-110.4) 36)  Gastroesophageal Reflux Disease  (ICD-530.81) 37)  Plantar Fasciitis, Bilateral  (ICD-728.71)  Medications Prior to Update: 1)  Vitamin D 1000 Unit Tabs (Cholecalciferol) .Marland Kitchen.. 1 By Mouth Qd 2)  Amitiza 24 Mcg Caps (Lubiprostone) .Marland Kitchen.. 1 By Mouth Once Daily As Needed Constipation 3)  Zofran Odt 4 Mg Tbdp (Ondansetron) .... Take Every Six Hours As Needed 4)  Tramadol Hcl 50 Mg Tabs (Tramadol Hcl) .... Take 1-2 Tablets Every 6 Hours As Needed For Pain 5)  Buspirone Hcl 15 Mg Tabs (Buspirone Hcl) .Marland Kitchen.. 1 By Mouth Two Times A Day For Anxiety 6)  Miralax  Powd (  Polyethylene Glycol 3350) .... Take 1 Capful (17 Grams) Dissolved in At Least 8 Ounces of Water/juice and Drink Twice Daily. May Titrate Dose As Needed 7)  Aciphex 20 Mg Tbec (Rabeprazole Sodium) .Marland Kitchen.. 1 By Mouth 8)  Augmentin 875-125 Mg Tabs (Amoxicillin-Pot Clavulanate) .Marland Kitchen.. 1 By Mouth Bid 9)  Mebendazole 100 Mg Chew (Mebendazole) .Marland Kitchen.. 1 By Mouth Two Times A Day - Start On Dec 20th.  Current Medications (verified): 1)  Vitamin D 1000 Unit Tabs (Cholecalciferol) .Marland Kitchen.. 1 By Mouth Qd 2)   Amitiza 24 Mcg Caps (Lubiprostone) .Marland Kitchen.. 1 By Mouth Once Daily As Needed Constipation 3)  Zofran Odt 4 Mg Tbdp (Ondansetron) .... Take Every Six Hours As Needed 4)  Tramadol Hcl 50 Mg Tabs (Tramadol Hcl) .... Take 1-2 Tablets Every 6 Hours As Needed For Pain 5)  Buspirone Hcl 15 Mg Tabs (Buspirone Hcl) .Marland Kitchen.. 1 By Mouth Two Times A Day For Anxiety 6)  Miralax  Powd (Polyethylene Glycol 3350) .... Take 1 Capful (17 Grams) Dissolved in At Least 8 Ounces of Water/juice and Drink Twice Daily. May Titrate Dose As Needed 7)  Aciphex 20 Mg Tbec (Rabeprazole Sodium) .Marland Kitchen.. 1 By Mouth 8)  Augmentin 875-125 Mg Tabs (Amoxicillin-Pot Clavulanate) .Marland Kitchen.. 1 By Mouth Bid 9)  Prednisone 20 Mg Tabs (Prednisone) .... Take Four Tablets Once A Day For One Week Then Three A Day For A Week, Then Two A Day Until Seen By Dr. Daiva Eves  Allergies: 1)  ! * Contrast Dye   Preventive Screening-Counseling & Management  Alcohol-Tobacco     Alcohol drinks/day: 0     Alcohol Counseling: not indicated; patient does not drink     Smoking Status: never     Tobacco Counseling: not indicated; no tobacco use   Current Allergies (reviewed today): ! 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 Never Smoked Alcohol use-no Drug use-no Regular exercise-yes  Risk Factors: Alcohol Use: 0 (10/16/2010) Caffeine Use: 0 (09/04/2010) Exercise: yes (09/04/2010)  Risk Factors: Smoking Status: never (10/16/2010)  Past Medical History: Hx of TINEA PEDIS (ICD-110.4) GASTROESOPHAGEAL REFLUX DISEASE (ICD-530.81) PLANTAR FASCIITIS, BILATERAL (ICD-728.71) Tuberculosis 1998 [place], rx again in 2001(of note he had evdence of calcified lymph nodes in abdomen as well as some  duodenal inflammation)  Hep B SAb/CAb+ DUDODENITIS Lymphedema in the right leg IBS Psychosomatic disorder Anxiety Headaches   Family History: Reviewed history from 02/12/2010 and no changes required. Unknown  Social History: Reviewed history from 03/05/2010 and no changes required. Occupation: [employer] Single Never Smoked Alcohol use-no Drug use-no Regular exercise-yes  Review of Systems       The patient complains of abdominal pain.  The patient denies anorexia, fever, weight loss, weight gain, vision loss, decreased hearing, hoarseness, chest pain, syncope, dyspnea on exertion, peripheral edema, prolonged cough, headaches, hemoptysis, melena, hematochezia, severe indigestion/heartburn, hematuria, incontinence, genital sores, muscle weakness, suspicious skin lesions, transient blindness, difficulty walking, depression, unusual weight change, abnormal bleeding, enlarged lymph nodes, and angioedema.    Vital Signs:  Patient profile:   30 year old male Height:      71 inches (180.34 cm) Weight:      194 pounds (88.18 kg) BMI:     27.16 Temp:     98.4 degrees F (36.89 degrees C) oral Pulse  rate:   88 / minute BP sitting:   114 / 78  (left arm)  Vitals Entered By: Starleen Arms CMA (October 16, 2010 2:44 PM) CC: f/u  Is Patient Diabetic? No Pain Assessment Patient in pain? no      Nutritional Status BMI of 25 - 29 = overweight Nutritional Status Detail nl  Does patient need assistance? Functional Status Self care Ambulation Normal   Physical Exam  General:  alert, well-developed, well-nourished, and well-hydrated.   Head:  normocephalic and atraumatic.   Eyes:  vision grossly intact, pupils equal, pupils round, and pupils reactive to light.   Ears:  no external deformities.   Nose:  no external erythema and no nasal discharge.   Mouth:  good dentition, pharynx pink and moist, no erythema, and no exudates.   Neck:  supple and full ROM.   Lungs:   normal respiratory effort and normal breath sounds.  normal respiratory effort, no crackles, and no wheezes.   Heart:  normal rate, regular rhythm, no murmur, no gallop, and no rub.   Abdomen:  soft, non-tender, and normal bowel sounds.   Msk:  normal ROM and no joint instability.   Extremities:  He has extensive 3+ edema in the right thigh and calve Neurologic:  alert & oriented X3 and strength normal in all extremities.   Skin:  he has 3+ edema on the right, area of LN biopsy non tender to palpation skin on sole of foot is chrnoically hyperpigmented and thickened Psych:  Oriented X3 and normally interactive.     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 He had calcified lyph nodes in the abdomen and thickening around the duodenum in 2001 on CT scan. There had been consideration of LN biopntil radiologist at the tiem felt that the calcified LN with positive PPD meant these wre all old granulomatous disease from TB. I need to double check if he was given 4 drugs again in 2001 or just rx for LTB then. In any case his CT abdomen and pelvis have shown no evidence of recurrence. He had thickening of his skin on CT in 2001 and 2004 as well as scattered LN that had since decreased in size. There is TB involving the skin itself described in the literature. However I do not understand how he would now have isolated recurrence of TB in skin despite continued  evidence that his prior disseminated TB infection which was treated in [place] and  is resolved (calcifications etc). I will discuss the case further with Zackery Barefoot at [facility] but I currently see no reason to reinitiate tb therapy His updated medication list for this problem includes:    Augmentin 875-125 Mg Tabs (Amoxicillin-pot clavulanate) .Marland Kitchen... 1 by mouth bid  Orders: Est. Patient Level IV (16109)  Problem # 2:  SARCOIDOSIS (ICD-135) His ACE level was  elevated, unfortunately he never had tissue with noncaseating granulomas to show evidence of active disease. 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 I gave him rx for prednisone course over next month to see if this helps but I am not comfortable with treating him with steroids long term for uncertain diagnosis, esp without any evidence of active diseaes in any specific organ Orders: Est. Patient Level IV (60454) Est. Patient Level V (09811) Est. Patient Level V (91478)  Problem #  3:  CONSTIPATION, CHRONIC (ICD-564.09) improving on laxaives His updated medication list for this problem includes:    Miralax Powd (Polyethylene glycol 3350) .Marland Kitchen... Take 1 capful (17 grams) dissolved in at least 8 ounces of water/juice and drink twice daily. may titrate dose as needed  Problem # 4:  NIGHT SWEATS (ICD-780.8) pt has hx in chart of hep b and c, will check hep b dna, hep c rna at next visit Orders: Est. Patient Level IV (16109) Est. Patient Level V (60454) Est. Patient Level V (09811)  Problem # 5:  HEPATITIS C CARRIER (ICD-V02.62) see above Orders: T-Hepatitis C Viral Load (91478-29562) Est. Patient Level V (13086)  Problem # 6:  HEPATITIS B, HX OF (ICD-V12.09) see above Orders: Est. Patient Level V (57846)  Medications Added to Medication List This Visit: 1)  Prednisone 20 Mg Tabs (Prednisone) .... Take four tablets once a day for one week then three a day for a week, then two a day until seen by dr. Zenaida Niece dam  Other Orders: T-Hepatitis B DNA, Quant [phone])  Patient Instructions: 1)  I am going to start you on treatment for possible sarcoidosis with prednisone 2)  you will take four 20mg  tablets= 80mg  per day for one week then 3)  three tablets a day for one week  4)  then two tablets a day 5)  Rtc to see Dr Daiva Eves in January Prescriptions: PREDNISONE 20 MG TABS (PREDNISONE) take four tablets once a day for  one week then three a day for a week, then two a day until seen by Dr. Daiva Eves  #77 x 1   Entered and Authorized by:   Acey Lav MD   Signed by:   Paulette Blanch Dam MD on 10/16/2010   Method used:   Electronically to        [id number]* (retail)       [address] [place], [place]  [address]       Ph: [phone]       Fax: [phone]   RxID:   [id number]

## 2010-12-11 NOTE — Assessment & Plan Note (Signed)
Summary: Severe abd pain,  see phone note.  dfs   History of Present Illness Visit Type: Initial Visit Primary GI MD: Lina Sar MD Primary Provider: Tresa Garter MD Chief Complaint: severe abdominal pain History of Present Illness:   29 YO SUDANESE MALE,NEW TO GI TODAY-REFERRED FOR ABDOMINAL PAIN AND HEME POSITIVE STOOL. HE SAYS HE UNDERSTANDS ENGLISH, AND DOES NOT NEED AN INTERPRETER,HE DOES NOT WRITE. PT C/O ONGOING ABDOMINAL DISCOMFORT OVER THE PAST YEAR,WORSE SINCE DECEMBER. PAIN IS PRESENT DAILY ,CONSTANT,VARIES IN INTENSITY.Marland Kitchen HE C/O ALOT OF BELCHING, BURNING IN HIS CHEST,OCCASIONALLY FOOD STICKING ON SWALLOWING,NO REGURGITATION BUT USES WATER TO AIDE SWALLOWING. HIS BMS HAVE NOT CHANGED-HAS BMQ2-3 DAYS, NO MELENA,OR HEME. APPETITE FAIR,WEIGHT DOWN 12 POUNDS. HE ALSO RELATES ALOT OF NON GI C/O WITH WATERY ITCHY EYES CONGESTION.HEADACHES,WEAKNESS. HE WAS GIVEN A TRIAL OF PRILOSEC AND LEVBID PER DR. Virgina Organ FOR A FEW DAYS AND DID NOT SEEM TO HELP SO STOPPED.   GI Review of Systems    Reports abdominal pain, acid reflux, belching, bloating, heartburn, and  weight loss.     Location of  Abdominal pain: lower abdomen. Weight loss of 12 pounds   Denies chest pain, dysphagia with liquids, dysphagia with solids, loss of appetite, nausea, vomiting, and  vomiting blood.      Reports constipation.     Denies anal fissure, black tarry stools, change in bowel habit, diarrhea, diverticulosis, fecal incontinence, heme positive stool, hemorrhoids, irritable bowel syndrome, jaundice, light color stool, liver problems, rectal bleeding, and  rectal pain.    Current Medications (verified): 1)  Vitamin D 1000 Unit Tabs (Cholecalciferol) .Marland Kitchen.. 1 By Mouth Qd 2)  Promethazine-Codeine 6.25-10 Mg/42ml Syrp (Promethazine-Codeine) .... 5-10 Ml By Mouth Q Id As Needed Cough  Allergies (verified): 1)  ! * Contrast Dye  Past History:  Past Medical History: Reviewed history from 09/07/2007 and no  changes required. Hx of TINEA PEDIS (ICD-110.4) GASTROESOPHAGEAL REFLUX DISEASE (ICD-530.81) PLANTAR FASCIITIS, BILATERAL (ICD-728.71)    Past Surgical History: Reviewed history from 02/12/2010 and no changes required. R groin ?TB infection surgery - remote  Family History: Reviewed history from 02/12/2010 and no changes required. Unknown  Social History: Reviewed history from 03/05/2010 and no changes required. 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 Alcohol use-no Drug use-no Regular exercise-yes  Review of Systems       The patient complains of allergy/sinus, arthritis/joint pain, back pain, coughing up blood, fatigue, headaches-new, heart rhythm changes, muscle pains/cramps, night sweats, nosebleeds, and sleeping problems.  The patient denies anemia, anxiety-new, blood in urine, breast changes/lumps, change in vision, confusion, cough, depression-new, fainting, fever, hearing problems, heart murmur, itching, menstrual pain, pregnancy symptoms, shortness of breath, skin rash, sore throat, swelling of feet/legs, swollen lymph glands, thirst - excessive , urination - excessive , urination changes/pain, urine leakage, vision changes, and voice change.         ROS OTHERWISE AS IN HPI  Vital Signs:  Patient profile:   30 year old male Height:      71 inches Weight:      207.38 pounds BMI:     29.03 Pulse rate:   80 / minute Pulse rhythm:   regular BP sitting:   100 / 68  (left arm) Cuff size:   regular  Vitals Entered By: June McMurray CMA Duncan Dull) (March 07, 2010 8:28 AM)  Physical Exam  General:  Well developed, well nourished, no acute distress. Head:  Normocephalic and atraumatic. Eyes:  conjunctival injection.   Lungs:  Clear  throughout to auscultation. Heart:  Regular rate and rhythm; no murmurs, rubs,  or bruits. Abdomen:  SOFT, TENDER IN MID ABDOMEN , NO PALP MASS OR HSM,BS+ Rectal:  NOT DONE Extremities:  No clubbing, cyanosis, edema or deformities  noted. Neurologic:  Alert and  oriented x4;  grossly normal neurologically. Psych:  Alert and cooperative. Normal mood and affect.   Impression & Recommendations:  Problem # 1:  ABDOMINAL PAIN-PERIUMBILICAL (ICD-789.05) Assessment New 29 YO SUDANESE MALE WITH 4 MONTH HX OF CONSTANT MID ABDOMINAL PAIN,WEIGHT LOSS OF 12 POUNDS,HEME POSITIVE STOOL,AND C/O INTERMITTENT SOLID FOOD DYSPHAGIA,GERD.   BASELINE LABS PER DR JONES REVIEWED AND UNREMARKABLE  SCHEDULE FOR CT SCAN ABD/PELVIS START ALIGN ONE DAILY CONTINUE PRILOSEC 20 MG DAILY IN AM TRIAL OF BENTYL 10 MG TWICE DAILY SCHEDULE FOR EGD WITH POSSIBLE DILATION WITH DR. Hermelinda Medicus WAS DISCUSSED IN DETAIL WITH PT.  Other Orders: CT Abdomen/Pelvis with Contrast (CT Abd/Pelvis w/con) EGD SAV (EGD SAV)  Patient Instructions: 1)  Continue the Prilosec over the counter , take 1 30 min prior to breakfast.  Samples given. 2)  Take 1 Align capsule daily, samples and coupons given.  Take for 1 month. 3)  We scheduled the CT Scan at [facility] for 03-12-10. Directions given. 4)  We scheduled the Endoscopy with Dr. Juanda Chance for tomorrow 03-08-10. Directions given. 5)  Copy sent to : Elease Hashimoto, Md 6)  The medication list was reviewed and reconciled.  All changed / newly prescribed medications were explained.  A complete medication list was provided to the patient / caregiver. Prescriptions: BENTYL 10 MG CAPS (DICYCLOMINE HCL) Take 1 tab twice daily  #60 x 0   Entered by:   Lowry Ram NCMA   Authorized by:   Sammuel Cooper PA-c   Signed by:   Lowry Ram NCMA on 03/07/2010   Method used:   Electronically to        [facility] [address] [id number]* (retail)       [address] [place], [place]  [address]       Ph: [phone]       Fax: [phone]   RxID:   [id number]

## 2010-12-11 NOTE — Assessment & Plan Note (Signed)
Summary: insomnia due to pain all over-lb   Vital Signs:  Patient profile:   30 year old male Weight:      210.50 pounds O2 Sat:      98 % on Room air Temp:     99.1 degrees F oral Pulse rate:   75 / minute BP sitting:   100 / 60  (left arm) Cuff size:   regular  Vitals Entered By: Lucious Groves (February 12, 2010 3:26 PM)  O2 Flow:  Room air CC: C/O insomnia due to pain all over his body./kb Is Patient Diabetic? No Pain Assessment Patient in pain? yes     Location: all over Intensity: 10 Type: cramping Onset of pain  2 months ago   CC:  C/O insomnia due to pain all over his body./kb.  History of Present Illness: The patient presents with complaints of sore throat, fever, cough, sinus congestion and drainge of 2 mo duration. Not better with OTC meds. Chest hurts with coughing. Can't sleep due to cough. Muscle aches are present.  The mucus is colored.   Preventive Screening-Counseling & Management  Alcohol-Tobacco     Smoking Status: never  Current Medications (verified): 1)  None  Allergies (verified): No Known Drug Allergies  Past History:  Past Medical History: Last updated: 09/07/2007 Hx of TINEA PEDIS (ICD-110.4) GASTROESOPHAGEAL REFLUX DISEASE (ICD-530.81) PLANTAR FASCIITIS, BILATERAL (ICD-728.71)    Past Surgical History: R groin ?TB infection surgery - remote  Family History: Unknown  Social History: [place] refugee Occupation: [employer] Single Never Smoked  Review of Systems       The patient complains of fever and prolonged cough.  The patient denies hoarseness, chest pain, syncope, hemoptysis, and abdominal pain.    Physical Exam  General:  NAD, looks tired Ears:  WNL Mouth:  Erythematous throat mucosa and intranasal erythema.  Neck:  Supple Lungs:  CTA Heart:  RRR Abdomen:  S/NT Msk:  R upper thigh w/a scar Extremities:  No clubbing, cyanosis, edema, or deformity noted with normal full range of motion of all joints.   Neurologic:  No  cranial nerve deficits noted. Station and gait are normal. Plantar reflexes are down-going bilaterally. DTRs are symmetrical throughout. Sensory, motor and coordinative functions appear intact. Skin:  No rash Psych:  Cognition and judgment appear intact. Alert and cooperative with normal attention span and concentration. No apparent delusions, illusions, hallucinations   Impression & Recommendations:  Problem # 1:  BRONCHITIS, ACUTE (ICD-466.0) Assessment New To work 4/9 Orders: T-2 View CXR, Same Day (71020.5TC) TLB-B12, Serum-Total ONLY (61607-P71) TLB-CBC Platelet - w/Differential (85025-CBCD) TLB-Hepatic/Liver Function Pnl (80076-HEPATIC) TLB-BMP (Basic Metabolic Panel-BMET) (80048-METABOL) TLB-Sedimentation Rate (ESR) (85652-ESR) TLB-TSH (Thyroid Stimulating Hormone) (84443-TSH) EKG w/ Interpretation (93000) TLB-Udip ONLY (81003-UDIP)  His updated medication list for this problem includes:    Augmentin 875-125 Mg Tabs (Amoxicillin-pot clavulanate) .Marland Kitchen... 1 by mouth bid    Promethazine-codeine 6.25-10 Mg/76ml Syrp (Promethazine-codeine) .Marland Kitchen... 5-10 ml by mouth q id as needed cough  Problem # 2:  ARTHRALGIA (ICD-719.40) due to #1 Assessment: New R/o other reasons The office visit took longer than 20 min with patient councelling for more than 50% of the 20 min   Orders: TLB-B12, Serum-Total ONLY (06269-S85) TLB-CBC Platelet - w/Differential (85025-CBCD) TLB-Hepatic/Liver Function Pnl (80076-HEPATIC) TLB-BMP (Basic Metabolic Panel-BMET) (80048-METABOL) TLB-Sedimentation Rate (ESR) (85652-ESR) TLB-TSH (Thyroid Stimulating Hormone) (84443-TSH) EKG w/ Interpretation (93000) TLB-Udip ONLY (81003-UDIP)  Complete Medication List: 1)  Vitamin D 1000 Unit Tabs (Cholecalciferol) .Marland Kitchen.. 1 by mouth qd 2)  Augmentin 875-125 Mg Tabs (Amoxicillin-pot clavulanate) .Marland Kitchen.. 1 by mouth bid 3)  Promethazine-codeine 6.25-10 Mg/71ml Syrp (Promethazine-codeine) .... 5-10 ml by mouth q id as needed  cough  Patient Instructions: 1)  Call if you are not better in a reasonable amount of time or if worse.  2)  Please schedule a follow-up appointment in 1 month. Prescriptions: PROMETHAZINE-CODEINE 6.25-10 MG/5ML SYRP (PROMETHAZINE-CODEINE) 5-10 ml by mouth q id as needed cough  #300 ml x 0   Entered and Authorized by:   Tresa Garter MD   Signed by:   Tresa Garter MD on 02/12/2010   Method used:   Print then Give to Patient   RxID:   [id number] AUGMENTIN 875-125 MG TABS (AMOXICILLIN-POT CLAVULANATE) 1 by mouth bid  #20 x 0   Entered and Authorized by:   Tresa Garter MD   Signed by:   Tresa Garter MD on 02/12/2010   Method used:   Print then Give to Patient   RxID:   [id number] VITAMIN D 1000 UNIT TABS (CHOLECALCIFEROL) 1 by mouth qd  #100 x 3   Entered and Authorized by:   Tresa Garter MD   Signed by:   Tresa Garter MD on 02/12/2010   Method used:   Print then Give to Patient   RxID:   [id number]

## 2010-12-11 NOTE — Assessment & Plan Note (Signed)
Summary: f/u appt /#/cd   Vital Signs:  Patient profile:   30 year old male Height:      71 inches Weight:      202 pounds BMI:     28.28 O2 Sat:      97 % on Room air Temp:     98.2 degrees F oral Pulse rate:   70 / minute Pulse rhythm:   regular Resp:     16 per minute BP sitting:   100 / 70  (left arm) Cuff size:   regular  Vitals Entered By: Lanier Prude, CMA(AAMA) (June 15, 2010 4:03 PM)  O2 Flow:  Room air CC: f/u Is Patient Diabetic? No Comments pt states he is taking Aciphex and needs Rf.  He is not taking Nexium   Primary Care Udell Blasingame:  Tresa Garter MD  CC:  f/u.  History of Present Illness: F/u abd pain - Aciphex helped; Nexium, Omeprazole  and others  did not help F/u nausea, constipation C/o insomnia  Current Medications (verified): 1)  Zofran Odt 4 Mg Tbdp (Ondansetron) .Marland Kitchen.. 1 Tab By Mouth Every Morning As Needed For Nausea 2)  Thigh High Support Stocking .... Use As Directed 3)  Promethazine Hcl 25 Mg Tabs (Promethazine Hcl) .... As Needed 4)  Ativan 0.5 Mg Tabs (Lorazepam) .... Take 1 Tablet By Mouth One To Two Times Daily As Needed For Nausea and Nerves 5)  Paxil 20 Mg Tabs (Paroxetine Hcl) .... Take 1 Tablet By Mouth At Bedtime 6)  Colace 100 Mg Caps (Docusate Sodium) .... Take 2 Tablets By Mouth Once Daily 7)  Nexium 40 Mg Cpdr (Esomeprazole Magnesium) .... One Tablet By Mouth Once Daily 8)  Colace 100 Mg Caps (Docusate Sodium) .... Take 1 Tablet By Mouth Once A Day  Allergies (verified): 1)  ! 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  The patient denies fever, weight loss, weight gain, chest pain, dyspnea on exertion, and melena.         Sweats  Physical Exam  General:  alert, well-developed, well-nourished, well-hydrated, appropriate dress, normal appearance, healthy-appearing, cooperative to examination, and good hygiene.   Head:  normocephalic, atraumatic, no abnormalities observed, and no abnormalities palpated.   Mouth:  Oral mucosa and oropharynx without lesions or exudates.  Teeth in good repair. Lungs:  Normal respiratory effort, chest expands symmetrically. Lungs are clear to auscultation, no crackles or wheezes. Heart:  Normal rate and regular rhythm. S1 and S2 normal without gallop, murmur, click, rub or other extra sounds. Abdomen:  soft, non-tender, normal bowel sounds, no distention, no masses, no guarding, no rigidity, no rebound tenderness, no abdominal hernia, no inguinal hernia, no hepatomegaly, and no splenomegaly.   Msk:  No deformity or scoliosis noted of thoracic or lumbar spine.  R groin is sensitive to palp Extremities:  No clubbing, cyanosis, edema, or deformity noted with normal full range of motion of all joints.   Neurologic:  No cranial nerve deficits noted. Station and gait are normal. Plantar reflexes are down-going bilaterally. DTRs are symmetrical throughout. Sensory, motor and coordinative functions appear intact. Skin:  No rash Cervical Nodes:  No lymphadenopathy noted Inguinal Nodes:  No significant adenopathy Psych:  Cognition and judgment appear intact. Alert and cooperative with normal attention span and concentration. No apparent delusions, illusions, hallucinations   Impression & Recommendations:  Problem # 1:  ABDOMINAL PAIN-EPIGASTRIC (ICD-789.06) Assessment Improved He needs Aciphex  (the only med that helped him so far) - will get a PA  Problem # 2:  GERD (ICD-530.81) Assessment: Improved  The following  medications were removed from the medication list:    Nexium 40 Mg Cpdr (Esomeprazole magnesium) ..... One tablet by mouth once daily His updated medication list for this problem includes:    Aciphex 20 Mg Tbec (Rabeprazole sodium) .Marland Kitchen... 1 by mouth  Problem # 3:  INSOMNIA, CHRONIC (ICD-307.42) Assessment: New Trazodone to try  Problem # 4:  NIGHT SWEATS (ICD-780.8) Assessment: Improved  Complete Medication List: 1)  Promethazine Hcl 25 Mg Tabs (Promethazine hcl) .... As needed 2)  Colace 100 Mg Caps (Docusate sodium) .... Take 1 tablet by mouth once a day 3)  Aciphex 20 Mg Tbec (Rabeprazole sodium) .Marland Kitchen.. 1 by mouth 4)  Thigh High Support Stocking  .... Use as directed 5)  Trazodone Hcl 50 Mg Tabs (Trazodone hcl) .Marland Kitchen.. 1 by mouth at bedtime for sleep  Patient Instructions: 1)  Please schedule a follow-up appointment in 3 months. Prescriptions: TRAZODONE HCL 50 MG TABS (TRAZODONE HCL) 1 by mouth at bedtime for sleep  #30 x 6   Entered and Authorized by:   Tresa Garter MD   Signed by:   Tresa Garter MD on 06/15/2010   Method used:   Print then Give to Patient   RxID:   [id number] ACIPHEX 20 MG TBEC (RABEPRAZOLE SODIUM) 1 by mouth  #30 x 12   Entered and Authorized by:   Tresa Garter MD   Signed by:   Tresa Garter MD on 06/15/2010   Method used:   Print then Give to Patient   RxID:   [id number]

## 2010-12-11 NOTE — Assessment & Plan Note (Signed)
Summary: discuss MRI   Referring Provider:  na Primary Provider:  Tresa Garter MD  CC:  discuss MRI.  History of Present Illness: 30 yo African man who was diagnosed with TB via pelvic  LN biopsy in [place] where he was treated. He had diffuse swelling of his right leg and pain in his foot. This responded to TB drugs. He then had recurrence of symptoms in the [place] and was seen by our group in 2001 and received 6 months of TB therapy herein [place]. He now has returned to care here and been followed by Dr. Ninetta Lights after recurrence of swelling and pain in his groin, thigh, calf and in bottom of footl. y. US of the thigh failed to show anything that couuld be biospied on [place] and MRI of foot showed some "cellulitis" at base of the foot. My partner, Dr. Ninetta Lights referred him to HD for rx of TB but they apparently are not convinced this is recurrence of TB and do not recommend tx at this point.  He continues to have RLE pain and swelling and feels the swelling is getting progressively worse.  He wsa been out of work for 6 days due the the swelling and pain; he is now unable to lift things or bend over 2/2 pain.    He describes the painin as "hard and "pressure."  No tingling/numbness in his legs.  He also notes new bilateral UE swelling.  He states the only time the swelling was totally resolved was after receiving TB tx in [place] in 1998.    He reports decreased appetite but no vomiting, nausea, or diarrhea; bowel movements are normal.  Notes some abdominal pain on the lateral aspects of his abdomen.   No fevers or chills, but does report night sweats for 5 months.  He also notes weight gain.  He notes occasional h/a in the bilateral temoral areas of his head that are minimally relieved by Aleve; these have been occuring since December.   Recent bMRI revealed non-specific ubcutaneous edema  No recent travel abroad or in the [place].  Preventive Screening-Counseling & Management  Alcohol-Tobacco     Alcohol  drinks/day: 0     Smoking Status: never  Caffeine-Diet-Exercise     Caffeine use/day: 0     Does Patient Exercise: yes  Safety-Violence-Falls     Seat Belt Use: yes   Updated Prior Medication List: PROMETHAZINE HCL 25 MG TABS (PROMETHAZINE HCL) as needed COLACE 100 MG CAPS (DOCUSATE SODIUM) Take 1 tablet by mouth once a day ACIPHEX 20 MG TBEC (RABEPRAZOLE SODIUM) 1 by mouth * THIGH HIGH SUPPORT STOCKING Use as directed TRAZODONE HCL 50 MG TABS (TRAZODONE HCL) 1 by mouth at bedtime for sleep PROMETHAZINE-CODEINE 6.25-10 MG/5ML SYRP (PROMETHAZINE-CODEINE) 5-10 ml by mouth q id as needed cough TRAMADOL HCL 50 MG TABS (TRAMADOL HCL) Take 1-2 tablets by mouth q6h as needed for pain  Current Allergies (reviewed today): ! * CONTRAST DYE Past History:  Past medical, surgical, family and social histories (including risk factors) reviewed for relevance to current acute and chronic problems.  Past Medical History: Reviewed history from 06/26/2010 and no changes required. 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 R groin ?TB infection surgery - remote completed in [place] Right foot surgery-completed in [place]  Family History:  Reviewed history from 02/12/2010 and no changes required. Unknown  Social History: Reviewed history from 03/05/2010 and no changes required. 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 yes     Location: right leg Intensity: 8 Type: burning Onset of pain  Constant Nutritional Status BMI of 25 - 29 = overweight Nutritional Status Detail appetite is not good per patient  Does patient need assistance? Functional Status Self care Ambulation Normal   Physical Exam  General:  alert, well-developed, well-nourished, and well-hydrated.   Head:  normocephalic, atraumatic, and no abnormalities observed.   Eyes:  pupils equal, pupils round, and pupils reactive to light.  EOMI.  Anicteric. Mouth:  pharynx pink and moist.   Neck:  no masses.   Lungs:  normal respiratory effort and normal breath sounds.   Heart:  normal rate, regular rhythm, and no murmur.   Abdomen:  soft, non-tender, and normal bowel sounds.   Extremities:  RLE is significantly larger than the left; this is most notable proximally but extends throughout the extremity.  Edema present throughout RLE. Neurologic:  alert & oriented X3, cranial nerves II-XII intact, strength normal in all extremities, sensation intact to light touch, and gait normal.   Skin:  turgor normal, color normal, and no rashes.   Psych:  Oriented X3, memory intact for recent and remote, normally interactive, good eye contact, not anxious appearing, and not depressed appearing.     Impression & Recommendations:  Problem # 1:  LYMPHEDEMA, RIGHT LEG (ICD-457.1)  It is highly unlikely that pts symptoms are the result of TB.  He is certainly at risk for lymphedema after undergoing LN dissection for TB, however pt reports his symptoms occurred in Dec 2010; one would expect that his symptoms would have started after LN removal.  MRI of the RLE revealed non-specific subcutaneous edema but no evidence of abscess, significantly enlarged LNs, or other pathologic process.  Will evalute for possible rheumatological condition with the following labs:  ANA, RF, ACE, CK, ANCA, CRP, ESR, Anti-Jo-1, Aldolase, LDH, and CMET.  Will also obtain CT of the abdomen and contrast with and without contrast for further evaluation.   Orders: T-CMP with estimated GFR ([id number]) T-C-Reactive Protein [phone]) T-CK Total [phone]) T-Sed Rate (Automated) [phone]) T-Rheumatoid Factor [phone]) T-Lactate Dehydrogenase (LDH) [phone]) T-Aldolase [phone]) Antinuclear Antib (ANA) ([id number]) T- * Misc. Laboratory test [phone]) T- * Misc. Laboratory test [phone]) CT with Contrast (CT w/ contrast) Est. Patient Level V (09323)  Problem # 2:  BACK PAIN, LUMBAR (ICD-724.2) Will provide tramadol for relief of RLE and back pain.  Please refer to plan outlined in problem #1 for further details. His updated medication list for this problem includes:    Tramadol Hcl 50 Mg Tabs (Tramadol hcl) .Marland Kitchen... Take 1-2 tablets by mouth q6h as needed for pain  Orders: T-CMP with estimated GFR ([id number]) T-C-Reactive Protein ([id number]) T-CK Total [phone]) T-Sed Rate (Automated) [phone]) T-Rheumatoid Factor [phone]) T-Lactate Dehydrogenase (LDH) ([phone]) T-Aldolase ([phone])  Antinuclear Antib (ANA) [phone]) T- * Misc. Laboratory test [phone]) T- * Misc. Laboratory test [phone]) CT with Contrast (CT w/ contrast) Est. Patient Level V (13244)  Problem # 3:  LEG PAIN, RIGHT (ICD-729.5) Will provide tramadol for relief of RLE and back pain.  Please refer to plan outlined in problem #1 for further details.  Greater than with at least 50% face to face time was spent couseling patient about his medical problems, reviewing previous visits and old records, reviewing current literature regarding myositis and rhematological conditions that may be the etiology behind pts symptoms, formulating the plan for continued  evaluation and tx of pts medical problems, and coordinating care with other health care  providers.  Orders: T-CMP with estimated GFR ([id number]) T-C-Reactive Protein [phone]) T-CK Total [phone]) T-Sed Rate (Automated) [phone]) T-Rheumatoid Factor [phone]) T-Lactate Dehydrogenase (LDH) [phone]) T-Aldolase [phone]) Antinuclear Antib (ANA) ([phone]) T- * Misc. Laboratory test [phone]) T- * Misc. Laboratory test [phone]) CT with Contrast (CT w/ contrast) Est. Patient Level V (69485)  Medications Added to Medication List This Visit: 1)  Tramadol Hcl 50 Mg Tabs (Tramadol hcl) .... Take 1-2 tablets by mouth q6h as needed for pain  Patient Instructions: 1)  Please schedule a follow-up appointment in the first week of October with Dr. Daiva Eves. 2)  We will get a CT scan to evaluate your swelling, leg, and back pain. 3)  Tramadol is for pain.  This medicine may make you feel sleepy, avoid (or be very careful) taking this medicine at work. Prescriptions: TRAZODONE HCL 50 MG TABS (TRAZODONE HCL) 1 by mouth at bedtime for sleep  #30 x 6   Entered and Authorized by:   Nelda Bucks DO   Signed by:   Nelda Bucks DO on 07/11/2010   Method used:   Print then Give to Patient   RxID:   [id number] TRAMADOL HCL 50 MG TABS (TRAMADOL HCL) Take 1-2 tablets by mouth q6h as needed for pain  #120 x 3   Entered by:   Nelda Bucks DO   Authorized by:   Acey Lav MD   Signed by:   Nelda Bucks DO on 07/11/2010   Method used:   Print then Give to Patient   RxID:   [id number]   Appended Document: discuss MRI I examined the pt with the resident and agree with the assessment and plan as outlined in her note. The patients MRI gives very little to suggest TB. He lacks fever (though he has had sweats at night) and he has weight gain rather than weight loss. He c/o of lower back pain, flank pain and abdominal pain today. Will get CT to investigate those sites. I will contemplate referral to surgeon for excisional lymp node biopsy. Muscles biopsy  would be a consideration but the muscles do not appear abnormal on MRI. I will touch base with Zackery Barefoot at [facility] re this pt as far as TB recurrence idea of pt. It has seemed exceedingingly unlikely to both myself and Dr. Roxan Hockey at [facility].

## 2010-12-11 NOTE — Progress Notes (Signed)
Summary: lab results  Phone Note Call from Patient Call back at Home Phone [phone]   Caller: Patient Summary of Call: Patient requesting  xray and lab results from 03/05/2010 office visit Initial call taken by: Rock Nephew CMA,  March 05, 2010 3:11 PM  Follow-up for Phone Call        all is normal Follow-up by: Etta Grandchild MD,  March 06, 2010 9:11 AM  Additional Follow-up for Phone Call Additional follow up Details #1::        Patient notified/lmovm Additional Follow-up by: Rock Nephew CMA,  March 06, 2010 10:49 AM

## 2010-12-11 NOTE — Assessment & Plan Note (Signed)
Summary: 2-3 wk f/u amy..dn   History of Present Illness Visit Type: Follow-up Visit Primary GI MD: Lina Sar MD Primary Provider: Tresa Garter MD Requesting Provider: na Chief Complaint: F/u from nausea and vomiting, and left side pain. Pt states that he is not feeling better and having the same symptoms History of Present Illness:   This is a 30 year old [place] male who has undergone a fairly extensive evaluation for upper abdominal pain and nausea. He has had a negative CT abdomen/pelvis and a negative colonoscopy. An EGD did show peptic duodenitis. He had been on Nexium 40 mg daily for about a month but stopped because he did not feel any better. He tried Bentyl without benefit. He has phenergan and zofran given by different doctors which again has not helped. He complains of epigastric and sometimes mid abdominal pain, nausea and daily vomiting. He says he vomits more than once some days and has brought up streaks of blood. He feels fatigued, "hurts all over my body."  He is undergoing evaluation with Dr Ninetta Lights in infectious disease for possible TB in his thigh. He has a history of TB. Lab test completed on 05/07/10 including a sedimentation rate, CBC, CMP and CK were within normal limits. An ANA titer was negative. Positive quantiferon  gold test for TB.   GI Review of Systems    Reports abdominal pain, nausea, and  vomiting.     Location of  Abdominal pain: left side.    Denies acid reflux, belching, bloating, chest pain, dysphagia with liquids, dysphagia with solids, heartburn, loss of appetite, vomiting blood, weight loss, and  weight gain.        Denies anal fissure, black tarry stools, change in bowel habit, constipation, diarrhea, diverticulosis, fecal incontinence, heme positive stool, hemorrhoids, irritable bowel syndrome, jaundice, light color stool, liver problems, rectal bleeding, and  rectal pain.    Current Medications (verified): 1)  Zofran Odt 4 Mg Tbdp  (Ondansetron) .Marland Kitchen.. 1 Tab By Mouth Every Morning As Needed For Nausea 2)  Thigh High Support Stocking .... Use As Directed 3)  Aciphex 20 Mg Tbec (Rabeprazole Sodium) .... Take 1 Tablet By Mouth 30 Minutes Before Breakfast Meal. (Pharmacy-Please D/c Rx For Nexium...it Is Ineffective) 4)  Promethazine Hcl 25 Mg Tabs (Promethazine Hcl) .... As Needed 5)  Ativan 0.5 Mg Tabs (Lorazepam) .... Take 1 Tablet By Mouth One To Two Times Daily As Needed For Nausea and Nerves  Allergies (verified): 1)  ! * Contrast Dye  Past History:  Past Medical History: Reviewed history from 05/07/2010 and no changes required. 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 R groin ?TB infection surgery - remote completed in [place] Right foot surgery-completed in [place]  Family History: Reviewed history from 02/12/2010 and no changes required. Unknown  Social History: Reviewed history from 03/05/2010 and no changes required. 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 Alcohol use-no Drug use-no Regular exercise-yes  Review of Systems  The patient denies allergy/sinus, anemia, anxiety-new, arthritis/joint pain, back pain, blood in urine, breast changes/lumps, change in vision, confusion, cough, coughing up blood, depression-new, fainting, fatigue, fever, headaches-new, hearing problems, heart murmur, heart rhythm changes, itching, menstrual pain, muscle pains/cramps, night sweats, nosebleeds, pregnancy symptoms, shortness of breath, skin rash, sleeping problems, sore throat, swelling of feet/legs, swollen lymph glands, thirst - excessive , urination -  excessive , urination changes/pain, urine leakage, vision changes, and voice change.         Pertinent positive and negative review of systems were  noted in the above HPI. All other ROS was otherwise negative.   Vital Signs:  Patient profile:   30 year old male Height:      71 inches Weight:      204 pounds BMI:     28.56 BSA:     2.13 Pulse rate:   62 / minute Pulse rhythm:   regular BP sitting:   110 / 74  (left arm) Cuff size:   regular  Vitals Entered By: Ok Anis CMA (May 24, 2010 8:27 AM)  Physical Exam  General:  Well developed, well nourished, no acute distress. Eyes:  PERRLA, no icterus. Mouth:  No deformity or lesions, dentition normal. Neck:  Supple; no masses or thyromegaly. Lungs:  Clear throughout to auscultation. Heart:  Regular rate and rhythm; no murmurs, rubs,  or bruits. Abdomen:  minimal tenderness with some pressure across the lower abdomen. No hernia, no palpable mass, Bowel sounds are active. Liver edge at costal margin. No distention or tympany. Extremities:  induration in the right colon from prior scar. No tenderness the induration extends into the medial aspect of the right thigh. Skin:  Intact without significant lesions or rashes. Inguinal Nodes:  mild nodularity in right groin. Psych:  Alert and cooperative. Normal mood and affect.   Impression & Recommendations:  Problem # 1:  ABDOMINAL PAIN-EPIGASTRIC (ICD-789.06) Patient has had a negative extensive GI workup which has included a CT scan of the abdomen and pelvis, upper endoscopy with duodenitis and negative colonoscopy. I feel patient's complaints are related to a functional problem; specifically irritable bowel syndrome. I spent a lot of time discussing it with him. He needs to know that the irritable bowel syndrome comes and goes and that his abdomen may at times feel swollen and tight and it is important for him to eat regularly and take his m3edications. I told him that he does not have a  serious medical  problem but that will continue to work with him.. We will start him on Paxil 20 mg at bedtime for functional abdominal pain. He will  continue AcipHex 20 mg daily and we will again refill his Zofran 4 mg p.r.n.  Problem # 2:  TUBERCULOSIS, HX OF (ICD-V12.01) Patient will follow up with Dr. Ninetta Lights concerning potential TB in his right groin.  Patient Instructions: 1)  refill Zofran 4 mg. 2)  Paxil 20 mg at bedtime. 3)  AcipHex 20 mg daily for duodenitis. 4)  Colace 2 tablets q.d. for constipation. 5)  Office visit 8 weeks. 6)  Copy sent to : Dr Posey Rea, Dr Ninetta Lights 7)  The medication list was reviewed and reconciled.  All changed / newly prescribed medications were explained.  A complete medication list was provided to the patient / caregiver. Prescriptions: ACIPHEX 20 MG TBEC (RABEPRAZOLE SODIUM) Take 1 tablet by mouth once a day  #30 x 2   Entered by:   Lamona Curl CMA (AAMA)   Authorized by:   Hart Carwin MD   Signed by:   Lamona Curl CMA (AAMA) on 05/24/2010   Method used:   Electronically to        [id number]* (retail)       [address]       [place], [place]  [address]       Ph:  [phone]       Fax: [phone]   RxID:   [id number] ZOFRAN ODT 4 MG TBDP (ONDANSETRON) 1 tab by mouth every morning as needed for nausea  #30 x 1   Entered by:   Lamona Curl CMA (AAMA)   Authorized by:   Hart Carwin MD   Signed by:   Lamona Curl CMA (AAMA) on 05/24/2010   Method used:   Electronically to        [id number]* (retail)       [address] [place], [place]  [address]       Ph: [phone]       Fax: [phone]   RxID:   [id number] COLACE 100 MG CAPS (DOCUSATE SODIUM) Take 2 tablets by mouth once daily  #60 x 2   Entered by:   Lamona Curl CMA (AAMA)   Authorized by:   Hart Carwin MD   Signed by:   Lamona Curl CMA (AAMA) on 05/24/2010   Method used:   Electronically to        [id number]* (retail)       [address] [place], [place]  [address]       Ph: [phone]       Fax:  [phone]   RxID:   [id number] PAXIL 20 MG TABS (PAROXETINE HCL) Take 1 tablet by mouth at bedtime  #30 x 2   Entered by:   Lamona Curl CMA (AAMA)   Authorized by:   Hart Carwin MD   Signed by:   Lamona Curl CMA (AAMA) on 05/24/2010   Method used:   Electronically to        [id number]* (retail)       [address] [place], [place]  [address]       Ph: [phone]       Fax: [phone]   RxID:   [id number]

## 2010-12-11 NOTE — Assessment & Plan Note (Signed)
Summary: stomach problems-lb   Vital Signs:  Patient profile:   30 year old male Height:      71 inches Weight:      200 pounds O2 Sat:      98 % Temp:     98 degrees F oral Pulse rate:   69 / minute BP sitting:   98 / 60  (left arm) Cuff size:   large  Vitals Entered By: Jarome Lamas (August 09, 2010 11:01 AM) CC: abdominal pain   Primary Care Olena Willy:  Tresa Garter MD  CC:  abdominal pain.  History of Present Illness: The patient presents for a follow up of abd  pain, constipation, anxiety  Current Medications (verified): 1)  Promethazine Hcl 25 Mg Tabs (Promethazine Hcl) .... As Needed 2)  Colace 100 Mg Caps (Docusate Sodium) .... Take 1 Tablet By Mouth Once A Day 3)  Aciphex 20 Mg Tbec (Rabeprazole Sodium) .Marland Kitchen.. 1 By Mouth 4)  Thigh High Support Stocking .... Use As Directed 5)  Trazodone Hcl 50 Mg Tabs (Trazodone Hcl) .Marland Kitchen.. 1 By Mouth At Bedtime For Sleep 6)  Promethazine-Codeine 6.25-10 Mg/6ml Syrp (Promethazine-Codeine) .... 5-10 Ml By Mouth Q Id As Needed Cough 7)  Tramadol Hcl 50 Mg Tabs (Tramadol Hcl) .... Take 1-2 Tablets By Mouth Q6h As Needed For Pain  Allergies (verified): 1)  ! 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 Alcohol use-no Drug use-no Regular exercise-yes  Past Medical History: Hx of TINEA PEDIS (ICD-110.4) GASTROESOPHAGEAL REFLUX DISEASE (ICD-530.81) PLANTAR FASCIITIS, BILATERAL (ICD-728.71) Tuberculosis 1998 [place], rx again in 2001 Hep B SAb/CAb+ DUDODENITIS Lymphedema in the right leg IBS-C Psychosomatic disorder Anxiety  Review of Systems       The patient complains of abdominal pain.  The patient denies fever, chest pain, syncope, hematochezia, and severe indigestion/heartburn.    Physical Exam  General:  alert, well-developed, well-nourished, well-hydrated, appropriate dress, normal appearance, healthy-appearing, cooperative to  examination, and good hygiene.   Nose:  External nasal examination shows no deformity or inflammation. Nasal mucosa are pink and moist without lesions or exudates. Mouth:  WNL Lungs:  Normal respiratory effort, chest expands symmetrically. Lungs are clear to auscultation, no crackles or wheezes. Heart:  Normal rate and regular rhythm. S1 and S2 normal without gallop, murmur, click, rub or other extra sounds. Abdomen:  soft, non-tender, normal bowel sounds, no distention, no masses, no guarding, no rigidity, no rebound tenderness, no abdominal hernia, no inguinal hernia, no hepatomegaly, and no splenomegaly.  Sensitive Msk:  No deformity or scoliosis noted of thoracic or lumbar spine.  R groin is sensitive to palp Neurologic:  No cranial nerve deficits noted. Station and gait are normal. Plantar reflexes are down-going bilaterally. DTRs are symmetrical throughout. Sensory, motor and coordinative functions appear intact. Skin:  No rash Psych:  Cognition and judgment appear intact. Alert and cooperative with normal attention span and concentration. No apparent delusions, illusions, hallucinations depressed affect.     Impression & Recommendations:  Problem # 1:  ABDOMINAL PAIN-EPIGASTRIC (ICD-789.06) due to #2-3 Assessment Unchanged  His updated medication list for this problem includes:    Amitiza 24 Mcg Caps (Lubiprostone) .Marland Kitchen... 1 by mouth once daily as needed constipation  Problem # 2:  CONSTIPATION, CHRONIC (ICD-564.09)/IBS-C Assessment: Unchanged  The following medications were removed from the medication list:    Colace 100 Mg Caps (Docusate sodium) .Marland Kitchen... Take 1 tablet by mouth once a day  Problem # 3:  SOMATIZATION DISORDER (ICD-300.81) Assessment: Unchanged Assessment: Unchanged  Complete Medication List: 1)  Vitamin D 1000 Unit Tabs (Cholecalciferol) .Marland Kitchen.. 1 by mouth qd 2)  Amitiza 24 Mcg Caps (Lubiprostone) .Marland Kitchen.. 1 by mouth once daily as needed constipation  Other  Orders: Admin 1st Vaccine (16109) Flu Vaccine 14yrs + (60454) TLB-Udip w/ Micro (81001-URINE) Flu Vaccine Consent Questions     Do you have a history of severe allergic reactions to this vaccine? no    Any prior history of allergic reactions to egg and/or gelatin? no    Do you have a sensitivity to the preservative Thimersol? no    Do you have a past history of Guillan-Barre Syndrome? no    Do you currently have an acute febrile illness? no    Have you ever had a severe reaction to latex? no    Vaccine information given and explained to patient? yes    Are you currently pregnant? no    Lot Number:[id number]   Exp Date:05/11/2011   Site Given  Left Deltoid IM Other Orders: Admin 1st Vaccine (09811) Flu Vaccine 74yrs + (91478)  Patient Instructions: 1)  Please schedule a follow-up appointment in 2 months. Prescriptions: AMITIZA 24 MCG CAPS (LUBIPROSTONE) 1 by mouth once daily as needed constipation  #30 x 6   Entered and Authorized by:   Tresa Garter MD   Signed by:   Tresa Garter MD on 08/09/2010   Method used:   Print then Give to Patient   RxID:   [id number]  .lbflu

## 2010-12-11 NOTE — Progress Notes (Signed)
Summary: Biopsy results  - Normal  Phone Note Call from Patient Call back at Home Phone [phone]   Caller: Patient Reason for Call: Talk to Nurse, Lab or Test Results Summary of Call: Pt. wanting to know results of biopsy.  RN shared that the results were Normal.  Did not show anything out of the Normal.  Pt. verbalied understanding.  Return appt. 10/31 w/ Dr. Daiva Eves confirmed with pt. Jennet Maduro RN  August 27, 2010 11:15 AM

## 2010-12-11 NOTE — Assessment & Plan Note (Signed)
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 He was treated with IN in 2001 with resolution again in symptoms of lymphedema. I have been skeptical of dx of TB and found no evidence fo nidus of persistent tb. I found his ace level was high and I have been suspicious of sarcoidosis. I sent him to [facility] for consideration of excisional LN biospy but Dr. Andrey Campanile did not wan tto do ex ln biopsy due to concenrs would worsen his lymphedema. I am trying to arrange FN aspiration of this area. I spent greater than 45 mnutes with this pt including greater than  50% of time coordinating care and counselling the pt.  Problems Prior to Update: 1)  Anxiety  (ICD-300.00) 2)  Constipation, Chronic  (ICD-564.09) 3)  Somatization Disorder  (ICD-300.81) 4)  Inguinal Lymphadenopathy, Right  (ICD-785.6) 5)  Back Pain, Lumbar  (ICD-724.2) 6)  Leg Pain, Right  (ICD-729.5) 7)  Upper Respiratory Infection, Acute  (ICD-465.9) 8)  Lymphedema, Right Leg  (ICD-457.1) 9)  Insomnia, Chronic  (ICD-307.42) 10)  Abdominal Pain-epigastric  (ICD-789.06) 11)  Nausea and Vomiting  (ICD-787.01) 12)  Tuberculosis  (ICD-011.90) 13)  Night Sweats  (ICD-780.8) 14)  Nausea  (ICD-787.02) 15)  Thigh Pain  (ICD-729.5) 16)  Duodenitis  (ICD-535.60) 17)  Fecal Occult Blood  (ICD-792.1) 18)  Dysphagia  (ICD-787.29) 19)  Heartburn  (ICD-787.1) 20)  Weight Loss-abnormal  (ICD-783.21) 21)  Abdominal Pain-periumbilical  (ICD-789.05) 22)  Weight Loss  (ICD-783.21) 23)  Dysphagia Unspecified  (ICD-787.20) 24)  Gerd  (ICD-530.81) 25)  Blood in Stool  (ICD-578.1) 26)  Abdominal Pain Other Specified Site  (ICD-789.09) 27)  Arthralgia  (ICD-719.40) 28)  Bronchitis, Acute  (ICD-466.0) 29)  Hx of Tinea Pedis  (ICD-110.4) 30)  Gastroesophageal  Reflux Disease  (ICD-530.81) 31)  Plantar Fasciitis, Bilateral  (ICD-728.71)  Medications Prior to Update: 1)  Vitamin D 1000 Unit Tabs (Cholecalciferol) .Marland Kitchen.. 1 By Mouth Qd 2)  Amitiza 24 Mcg Caps (Lubiprostone) .Marland Kitchen.. 1 By Mouth Once Daily As Needed Constipation  Current Medications (verified): 1)  Vitamin D 1000 Unit Tabs (Cholecalciferol) .Marland Kitchen.. 1 By Mouth Qd 2)  Amitiza 24 Mcg Caps (Lubiprostone) .Marland Kitchen.. 1 By Mouth Once Daily As Needed Constipation 3)  Zofran Odt 4 Mg Tbdp (Ondansetron) .... Take Every Six Hours As Needed  Allergies: 1)  ! * Contrast Dye    Current Allergies: ! * CONTRAST DYE Past History:  Past Medical History: Last updated: 08/09/2010 Hx of TINEA PEDIS (ICD-110.4) GASTROESOPHAGEAL REFLUX DISEASE (ICD-530.81) PLANTAR FASCIITIS, BILATERAL (ICD-728.71) Tuberculosis 1998 [place], rx again in 2001 Hep B SAb/CAb+ DUDODENITIS Lymphedema in the right leg IBS-C Psychosomatic disorder Anxiety  Past Surgical History: Last updated: 03/21/2010 R groin ?TB infection surgery - remote completed in [place] Right foot surgery-completed in [place]  Family History: Last updated: 02/12/2010 Unknown  Social History: Last updated: 03/05/2010 Occupation: [employer] Single Never Smoked Alcohol use-no Drug use-no Regular exercise-yes  Risk Factors: Alcohol Use: 0 (07/11/2010) Caffeine Use: 0 (07/11/2010) Exercise: yes (07/11/2010)  Risk Factors: Smoking Status: never (07/11/2010)  Family History: Reviewed history from 02/12/2010 and no changes required. Unknown  Social History: Reviewed history from 03/05/2010 and no changes required. Occupation: [employer] Single Never Smoked Alcohol use-no Drug use-no Regular exercise-yes  Review  of Systems  The patient denies anorexia, fever, weight loss, weight gain, vision loss, decreased hearing, hoarseness, chest pain, syncope, dyspnea on exertion, peripheral edema, prolonged cough, headaches, hemoptysis,  abdominal pain, melena, hematochezia, severe indigestion/heartburn, hematuria, incontinence, genital sores, muscle weakness, suspicious skin lesions, transient blindness, difficulty walking, depression, unusual weight change, abnormal bleeding, enlarged lymph nodes, and angioedema.    Physical Exam  General:  alert, well-developed, well-nourished, and well-hydrated.   Head:  normocephalic, atraumatic, and no abnormalities observed.   Eyes:  pupils equal, pupils round, and pupils reactive to light.   Ears:  no external deformities.   Mouth:  pharynx pink and moist.   Neck:  has lipoma on his back that has appeared in past several months Abdomen:  soft, non-tender, and normal bowel sounds.   Extremities:  He has extensive 3+ edema in the right thigh and calve Neurologic:  alert & oriented X3 and strength normal in all extremities.   Skin:  he has 3+ edema on the right, area of LN dissection is tender and enlarged, foot with discoloration on base with tenderness to palpation   Inguinal Nodes:  lymph node in inguinal area enlarged and tender Psych:  Oriented X3 and normally interactive.     Impression & Recommendations:  Problem # 1:  INGUINAL LYMPHADENOPATHY, RIGHT (ICD-785.6) will refer for FNA of LN for afb, fungal bacterial cultures and pathology. If this cannot be done will try trial of corticosteroids. I will also check cxr to see if he has hilar ladenopathy Orders: T-Culture & Smear AFB ([id number]) T-Culture, Wound ([id number]) T- * Misc. Laboratory test [phone]) Est. Patient Level V (47829)  Problem # 2:  LYMPHEDEMA, RIGHT LEG (ICD-457.1)  see above discussion  Orders: Est. Patient Level V (56213)  Problem # 3:  TUBERCULOSIS (ICD-011.90) see above, discussion. I think he may well have sarcoidosis instead Orders: Diagnostic X-Ray/Fluoroscopy (Diagnostic X-Ray/Flu) CXR- 2view (CXR) Est. Patient Level V (08657)  Problem # 4:  LIPOMA, SKIN (ICD-214.1) looks  like benign lipoma. will check cortisol and refer to PCP re this Orders: T-Cortisol Total [phone]) Est. Patient Level V (41324)  Problem # 5:  NAUSEA AND VOMITING (ICD-787.01) filled his zofran for this Orders: Est. Patient Level V (40102)  Medications Added to Medication List This Visit: 1)  Zofran Odt 4 Mg Tbdp (Ondansetron) .... Take every six hours as needed  Patient Instructions: 1)  rtc towards the end of October Prescriptions: ZOFRAN ODT 4 MG TBDP (ONDANSETRON) take every six hours as needed  #30 x 4   Entered and Authorized by:   Acey Lav MD   Signed by:   Paulette Blanch Dam MD on 08/13/2010   Method used:   Electronically to        [id number] (retail)       [address] [place], [place]  [address]       Ph: [phone]       Fax: [phone]   RxID:   [id number]

## 2010-12-11 NOTE — Progress Notes (Signed)
Summary: No Show  Phone Note Outgoing Call   Call placed by: Lamona Curl CMA Duncan Dull),  April 17, 2010 11:53 AM Call placed to: Patient Summary of Call: Per Selena Batten @ Dr Daiva Eves, patient was a no showed appointment on 04/16/10. I have spoken to patient that says " they didnt remind me." I have explained to patient that I spoke with him myself to remind him of appointment. I have rescheduled him to see Dr Ninetta Lights on 05/02/10 @ 2 pm with 1:45 arrival. Patient is to write appointment date down. Initial call taken by: Lamona Curl CMA (AAMA),  April 17, 2010 11:57 AM     Appended Document: No Show I agree that he ought to reschedule. Thanx

## 2010-12-11 NOTE — Consult Note (Signed)
Summary: [facility]   Imported By: Florinda Marker 08/21/2010 11:23:57  _____________________________________________________________________  External Attachment:    Type:   Image     Comment:   External Document

## 2010-12-11 NOTE — Letter (Signed)
Summary: [facility] Consult Scheduled Letter  [facility]  [address] [place], [place] [address]   Phone: [phone]  Fax: [phone]      03/05/2010 MRN: [id number]  [facility] [address] [name], [place]  [address]    Dear Chad Spears,      We have scheduled an appointment for you.  At the recommendation of Dr.Plotnikov, we have scheduled you a consult with Dr Jarold Motto( [facility]) on May 19,2010 at 9:00AM.Their phone number is [phone].If this appointment day and time is not convenient for you, please feel free to call the office of the doctor you are being referred to at the number listed above and reschedule the appointment.  [facility] [address]  [place] [address]  Thank you,  Patient Care Coordinator [facility]  Appended Document: [facility] Consult Scheduled Letter see second note , mailed second note to patient to inform about appt

## 2010-12-11 NOTE — Assessment & Plan Note (Signed)
Summary: cont. nausea, lethary, "bad all over," LGI requesting appt. /dde   Referring Provider:  Tresa Garter MD Primary Provider:  Georgina Quint Plotnikov MD  CC:  nausea, lethary, feeling "bad all over", and LGI request.  History of Present Illness: 30 yo M from [place] moved to [place] 04-2000.  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 He was treated with 6 months of therapy while in [place]  (he is unable to remember the names of his medicines, believes he took 3 or 4 meds). He was prev eval by ID in 2001 for R leg edema which was felt to be chronic and that he did not have active TB.  he has worsening abd pain and RLE pain and night sweats. Has been having worsening RLE edema, esp after exertion.    He had f/u CT scan 03-09-10: Unremarkable abdominal pelvic appearance.  No focal bowel abnormality suggested.  Irregular area of soft tissue density in the medial anterior thigh with adjacent associated skin thickening with no enhancement. The appearance suggests scarring/fibrosis.  Recently had negative HIV/Hep C/RPR. Hep B Surface Ab+ and Core Ab+.   He returns today with worsening pain in his leg, his R foot and his R thigh. no fevers    Preventive Screening-Counseling & Management  Alcohol-Tobacco     Alcohol drinks/day: 0     Smoking Status: never  Caffeine-Diet-Exercise     Caffeine use/day: 0     Does Patient Exercise: yes  Safety-Violence-Falls     Seat Belt Use: yes   Current Allergies (reviewed today): ! * CONTRAST DYE Vital Signs:  Patient profile:   30 year old male Height:      71 inches (180.34 cm) Weight:      205.8 pounds (93.55 kg) BMI:     28.81 Temp:     98.6 degrees F (37.00 degrees C) oral Pulse rate:   65 / minute BP sitting:   113 / 76  (left arm)  Vitals Entered By: Baxter Hire) (May 08, 2010 2:49 PM) CC: nausea, lethary, feeling "bad all over", LGI request Pain Assessment Patient in pain? yes     Location: body  aches Intensity: 9 Type: burning Onset of pain  Constant Nutritional Status Detail appetite is not good per patient  Does patient need assistance? Functional Status Self care Ambulation Normal   Physical Exam  General:  well-developed, well-nourished, and well-hydrated.   Abdomen:  soft and non-tender.   Extremities:  he has swelling in his R groin, especailly in the area of his scar. mild tenderness. no increase in heat.    Impression & Recommendations:  Problem # 1:  THIGH PAIN (ICD-729.5) he has a positive quantiferon gold. this could be from his previous treatment, I am suspiscious that he still has TB given that i do not have any of his previous records.  Will send him for CT guided BX, Cx. afterwards will send him to health dept for intiation of TB therapy.   Other Orders: Radiology other (Radiology Other) Est. Patient Level III (82956)  Appended Document: Orders Update    Clinical Lists Changes  Orders: Added new Test order of Radiology other (Radiology Other) - Signed

## 2010-12-11 NOTE — Assessment & Plan Note (Signed)
Summary: insomnia/extreme abdominal pain/unable to focus/lb   Vital Signs:  Patient profile:   30 year old male Height:      71 inches Weight:      197.25 pounds BMI:     27.61 Temp:     98.7 degrees F oral Pulse rate:   80 / minute Pulse rhythm:   regular Resp:     16 per minute BP sitting:   90 / 70  (left arm) Cuff size:   regular  Vitals Entered By: Lanier Prude, CMA(AAMA) (September 28, 2010 8:32 AM) CC: no appetite, HA, abd pain Is Patient Diabetic? No Comments pt states his head isnt working right and like there is something in there causing him to not be able to function.   Primary Care Ariell Gunnels:  Georgina Quint Plotnikov MD  CC:  no appetite, HA, and abd pain.  History of Present Illness: C/o abd pain - worse - "something is blocking it". C/o nausea C/o memory problems, HAs x few days Hydrocortisone did not help - he  took it twice so far He is a poor historian  Current Medications (verified): 1)  Vitamin D 1000 Unit Tabs (Cholecalciferol) .Marland Kitchen.. 1 By Mouth Qd 2)  Amitiza 24 Mcg Caps (Lubiprostone) .Marland Kitchen.. 1 By Mouth Once Daily As Needed Constipation 3)  Zofran Odt 4 Mg Tbdp (Ondansetron) .... Take Every Six Hours As Needed 4)  Milk of Magnesia 400 Mg/41ml Susp (Magnesium Hydroxide) .... Use As Dirrected For Constipation 5)  Tramadol Hcl 50 Mg Tabs (Tramadol Hcl) .... Take 1-2 Tablets Every 6 Hours As Needed For Pain 6)  Dexilant 60 Mg Cpdr (Dexlansoprazole) .... One By Mouth Once Daily For Heartburn 7)  Cortef 10 Mg Tabs (Hydrocortisone) .Marland Kitchen.. 1 By Mouth in Am and 1 At Lunch  Allergies (verified): 1)  ! 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) Tuberculosis 1998 [place], rx again in 2001 Hep B SAb/CAb+ DUDODENITIS Lymphedema in the right leg IBS-C Psychosomatic disorder Anxiety  Past Surgical History: Last  updated: 03/21/2010 R groin ?TB infection surgery - remote completed in [place] Right foot surgery-completed in [place]  Social History: Last updated: 03/05/2010 Occupation: [employer] Single Never Smoked Alcohol use-no Drug use-no Regular exercise-yes  Review of Systems       The patient complains of abdominal pain and severe indigestion/heartburn.  The patient denies anorexia, fever, weight loss, vision loss, chest pain, dyspnea on exertion, headaches, melena, and hematochezia.    Physical Exam  General:  Non-toxic, alert, well-developed, well-nourished, well-hydrated, appropriate dress, looks tired and slow, cooperative to examination, and with good hygiene.   Head:  normocephalic, atraumatic, no abnormalities observed, and no abnormalities palpated.   Nose:  External nasal examination shows no deformity or inflammation. Nasal mucosa are pink and moist without lesions or exudates. Mouth:  good dentition, no gingival abnormalities, no dental plaque, pharynx pink and moist, no erythema, no posterior lymphoid hypertrophy, no lesions, no tongue abnormalities, no leukoplakia, and no petechiae.   Neck:  supple, full ROM, no masses, no thyromegaly, no thyroid nodules or tenderness, no JVD, normal carotid upstroke, and no cervical lymphadenopathy.   Lungs:  Normal respiratory effort, chest expands symmetrically. Lungs are clear to auscultation, no crackles or wheezes. Heart:  Normal rate and regular rhythm. S1 and S2 normal without gallop, murmur, click, rub or other extra sounds. Abdomen:  soft, sensitive, normal bowel sounds,  no distention, no masses, no guarding, no rigidity, no rebound tenderness, no abdominal hernia, no inguinal hernia, no hepatomegaly, and no splenomegaly.   Msk:  No deformity or scoliosis noted of thoracic or lumbar spine.   Neurologic:  No cranial nerve deficits noted. Station and gait are normal. Plantar reflexes are down-going bilaterally. DTRs are symmetrical  throughout. Sensory, motor and coordinative functions appear intact. Skin:  turgor normal, color normal, no rashes, no suspicious lesions, no ecchymoses, no petechiae, and no purpura.   Psych:  Oriented X3, memory intact for recent and remote, normally interactive, good eye contact, not anxious appearing, not depressed appearing, not agitated, not suicidal, and not homicidal.     Impression & Recommendations:  Problem # 1:  ABDOMINAL PAIN-EPIGASTRIC (ICD-789.06) ? etiology - chronic Assessment Deteriorated Cont Hydrocortisine by mouth for a few more days. D/c if not better Repeat labs if not better by Monday His updated medication list for this problem includes:    Amitiza 24 Mcg Caps (Lubiprostone) .Marland Kitchen... 1 by mouth once daily as needed constipation  Problem # 2:  ANXIETY (ICD-300.00) Assessment: Deteriorated  His updated medication list for this problem includes:    Buspirone Hcl 15 Mg Tabs (Buspirone hcl) ..... Start with 1/2 tab two times a day then 1 by mouth two times a day for anxiety  Problem # 3:  HEADACHE (ICD-784.0) and  some new hard to define memory or mentation issues Assessment: Deteriorated  His updated medication list for this problem includes:    Tramadol Hcl 50 Mg Tabs (Tramadol hcl) .Marland Kitchen... Take 1-2 tablets every 6 hours as needed for pain  Orders: Radiology Referral (Radiology) CT brain  Problem # 4:  NAUSEA AND VOMITING (ICD-787.01) Assessment: Unchanged On the regimen of medicine(s) reflected in the chart    Complete Medication List: 1)  Vitamin D 1000 Unit Tabs (Cholecalciferol) .Marland Kitchen.. 1 by mouth qd 2)  Amitiza 24 Mcg Caps (Lubiprostone) .Marland Kitchen.. 1 by mouth once daily as needed constipation 3)  Zofran Odt 4 Mg Tbdp (Ondansetron) .... Take every six hours as needed 4)  Milk of Magnesia 400 Mg/74ml Susp (Magnesium hydroxide) .... Use as dirrected for constipation 5)  Tramadol Hcl 50 Mg Tabs (Tramadol hcl) .... Take 1-2 tablets every 6 hours as needed for pain 6)   Dexilant 60 Mg Cpdr (Dexlansoprazole) .... One by mouth once daily for heartburn 7)  Cortef 10 Mg Tabs (Hydrocortisone) .Marland Kitchen.. 1 by mouth in am and 1 at lunch 8)  Buspirone Hcl 15 Mg Tabs (Buspirone hcl) .... Start with 1/2 tab two times a day then 1 by mouth two times a day for anxiety  Other Orders: Gastroenterology Referral (GI)  Patient Instructions: 1)  Please schedule a follow-up appointment in 2 weeks. 2)  Call if you are not better in a reasonable amount of time or if worse. Go to ER if feeling really bad!  Prescriptions: BUSPIRONE HCL 15 MG TABS (BUSPIRONE HCL) Start with 1/2 tab two times a day then 1 by mouth two times a day for anxiety  #60 x 6   Entered and Authorized by:   Tresa Garter MD   Signed by:   Tresa Garter MD on 09/28/2010   Method used:   Print then Give to Patient   RxID:   [id number]    Orders Added: 1)  Radiology Referral [Radiology] 2)  Gastroenterology Referral [GI] 3)  Est. Patient Level IV [14782]

## 2010-12-11 NOTE — Assessment & Plan Note (Signed)
Summary: f/u ov/vs   Referring Provider:  na Primary Provider:  Tresa Garter MD  CC:  follow-up visit.  History of Present Illness: 30 yo M from [place] moved to [place] 04-2000.  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 He was treated with 6 months of therapy while in [place]  (he is unable to remember the names of his medicines, believes he took 3 or 4 meds). He was prev eval by ID in 2001 for R leg edema which was felt to be chronic and that he did not have active TB.  he has worsening abd pain and RLE pain and night sweats. Has been having worsening RLE edema, esp after exertion.    He had f/u CT scan 03-09-10: Unremarkable abdominal pelvic appearance.  No focal bowel abnormality suggested.  Irregular area of soft tissue density in the medial anterior thigh with adjacent associated skin thickening with no enhancement. The appearance suggests scarring/fibrosis.  Recently had negative HIV/Hep C/RPR. Hep B Surface Ab+ and Core Ab+. Quantiferon gold +.  He was seen in ID with the presumption that he had imcompletely treated TB. He was sent for u/s guided biopsy of his r thigh scarring. On discussion with IR they were unable to find any material, abscess/LN to biopsy.  Today denies fevers. Cont to have pain and swelling in his leg. c/o pain in his foot as well. also c/o night sweats.    He has had f/u with GI and has been told he has IBS.   Preventive Screening-Counseling & Management  Alcohol-Tobacco     Alcohol drinks/day: 0     Smoking Status: never  Caffeine-Diet-Exercise     Caffeine use/day: 0     Does Patient Exercise: yes  Safety-Violence-Falls     Seat Belt Use: yes   Updated Prior Medication List: ZOFRAN ODT 4 MG TBDP (ONDANSETRON) 1 tab by mouth every morning as needed for nausea * THIGH HIGH SUPPORT STOCKING Use as directed PROMETHAZINE HCL 25 MG TABS (PROMETHAZINE HCL) as needed ATIVAN 0.5 MG TABS (LORAZEPAM) Take 1 tablet by mouth one to two  times daily as needed for nausea and nerves PAXIL 20 MG TABS (PAROXETINE HCL) Take 1 tablet by mouth at bedtime COLACE 100 MG CAPS (DOCUSATE SODIUM) Take 2 tablets by mouth once daily ACIPHEX 20 MG TBEC (RABEPRAZOLE SODIUM) Take 1 tablet by mouth once a day  Current Allergies (reviewed today): ! * CONTRAST DYE Past History:  Past medical, surgical, family and social histories (including risk factors) reviewed, and no changes noted (except as noted below).  Past Medical History: Reviewed history from 05/07/2010 and no changes required. 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 R groin ?TB infection surgery - remote completed in [place] Right foot surgery-completed in [place]  Family History: Reviewed history from 02/12/2010 and no changes required. Unknown  Social History: Reviewed history from 03/05/2010 and no changes required. 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 Alcohol use-no Drug use-no Regular exercise-yes  Vital Signs:  Patient profile:   30 year old male Height:      71 inches (180.34 cm) Weight:      202.8 pounds (92.18 kg) BMI:     28.39 Temp:     98.5 degrees F (36.94 degrees C) oral Pulse rate:   65 /  minute BP sitting:   123 / 79  (left arm)  Vitals Entered By: Kathi Simpers [facility]) (May 29, 2010 3:25 PM) CC: follow-up visit Pain Assessment Patient in pain? yes     Location: right leg Intensity: 9 Type: aching Onset of pain  Constant Nutritional Status BMI of 25 - 29 = overweight Nutritional Status Detail appetite is good per patient  Does patient need assistance? Functional Status Self care Ambulation Normal   Physical Exam  General:  well-developed, well-nourished, and well-hydrated.   Eyes:  pupils equal, pupils  round, and pupils reactive to light.   Mouth:  pharynx pink and moist.  darkened area on his tongue.  Neck:  no masses.   Lungs:  normal respiratory effort and normal breath sounds.   Heart:  normal rate, regular rhythm, and no murmur.   Abdomen:  soft, non-tender, and normal bowel sounds.   Extremities:  tenderness in his R mid-foot.    Impression & Recommendations:  Problem # 1:  NIGHT SWEATS (ICD-780.8)  will send him for MRI of his R foot. He is convinced he has TB and I am suspicious as well. Would like tissue dx first. return to clinic 2 weeks.   Orders: Est. Patient Level III (47829) MRI with Contrast (MRI w/Contrast)

## 2010-12-11 NOTE — Progress Notes (Signed)
Summary: Triage  Phone Note Call from Patient Call back at Home Phone [phone]   Caller: Patient Call For: Dr. Juanda Chance Reason for Call: Talk to Nurse Summary of Call: Has appt. sch'd on 10-18-10 and requesting sooner appt. Constipation, abd bloating Initial call taken by: Karna Christmas,  September 28, 2010 2:18 PM  Follow-up for Phone Call        Spoke with patient. Added patient to wait list. Follow-up by: Jesse Fall RN,  September 28, 2010 2:45 PM

## 2010-12-11 NOTE — Progress Notes (Signed)
Summary: results request  Phone Note Call from Patient Call back at [phone]   Caller: Patient Call For: Dr. Juanda Chance Reason for Call: Lab or Test Results Summary of Call: would like to know EGD results Initial call taken by: Vallarie Mare,  March 09, 2010 10:30 AM  Follow-up for Phone Call        Returned pts phone call.  He had questions about the results of his procedure.  Explained to him that it would take several days to receive the results of pathology and for Dr Juanda Chance to look at them.  Told him if he did not hear anything after 2 weeks to call us back.  Pt verbalized understanding. Follow-up by: Jennye Boroughs RN,  March 09, 2010 10:52 AM

## 2010-12-11 NOTE — Progress Notes (Signed)
Summary: Appt. Scheduled  ---- Converted from flag ---- ---- 03/19/2010 8:23 AM, Hart Carwin MD wrote: Parke Simmers.  ---- 03/19/2010 8:20 AM, Laureen Ochs LPN wrote: Who is the patient?   ---- 03/18/2010 10:16 PM, Hart Carwin MD wrote: Izell [place], Dr Plotnikov would like me to help him with this case. Couls You, please fit him in for an appointment? You know I have put myself back in the office on 5/17,18. ------------------------------  Phone Note Outgoing Call   Call placed by: Laureen Ochs LPN,  Mar 19, 9146 8:53 AM Call placed to: Patient Summary of Call: Pt. worked in to see Dr.Sugar Vanzandt on 03-26-10 at 10:45am. Message left for patient to callback.  Initial call taken by: Laureen Ochs LPN,  Mar 19, 8294 8:55 AM  Follow-up for Phone Call        Appt. information reviewed w/pt. by phone and mailed to him. Pt. instructed to call back as needed.  Follow-up by: Laureen Ochs LPN,  Mar 19, 6212 9:37 AM

## 2010-12-11 NOTE — Letter (Signed)
Summary: Out of Work  [facility]  [address] [place], [place] [address]   Phone: [phone]  Fax: [phone]    March 05, 2010   Employee:  Chad Spears    To Whom It May Concern:   For Medical reasons, please excuse the above named employee from work for the following dates:  Start:   03/05/2010  End:   03/15/2010  If you need additional information, please feel free to contact our office.         Sincerely,    Alvy Beal A CMA Dr. Sanda Linger

## 2010-12-11 NOTE — Assessment & Plan Note (Signed)
Summary: vomiting---stc   Vital Signs:  Patient profile:   30 year old male Height:      71 inches (180.34 cm) Weight:      206.75 pounds (93.98 kg) BMI:     28.94 O2 Sat:      97 % on Room air Temp:     98.7 degrees F (37.06 degrees C) oral Pulse rate:   77 / minute Pulse rhythm:   regular BP sitting:   100 / 76  (left arm) Cuff size:   regular  Vitals Entered By: Brenton Grills (Mar 13, 2010 4:12 PM)  O2 Flow:  Room air CC: pt c/o vomiting with HA, dizziness, and neck pain for a while/aj   Primary Care Provider:  Tresa Garter MD  CC:  pt c/o vomiting with HA, dizziness, and and neck pain for a while/aj.  History of Present Illness: C/o nausea and vomiting and abd pain; body hurts all over; spitting a lot ; groin hurts. C/o a lot of burping. Tired since Dec  Current Medications (verified): 1)  Vitamin D 1000 Unit Tabs (Cholecalciferol) .Marland Kitchen.. 1 By Mouth Qd 2)  Promethazine-Codeine 6.25-10 Mg/68ml Syrp (Promethazine-Codeine) .... 5-10 Ml By Mouth Q Id As Needed Cough 3)  Align  Caps (Probiotic Product) .... Take 1 Daily For 1 Month 4)  Bentyl 10 Mg Caps (Dicyclomine Hcl) .... Take 1 Tab Twice Daily 5)  Prilosec Otc 20 Mg Tbec (Omeprazole Magnesium) .... Take 1 30 Min Prior To Breakfast  Allergies (verified): 1)  ! * Contrast Dye  Review of Systems       The patient complains of abdominal pain and severe indigestion/heartburn.  The patient denies fever, chest pain, dyspnea on exertion, peripheral edema, melena, hematochezia, and hematuria.     Impression & Recommendations:  Problem # 1:  HEARTBURN (ICD-787.1) Assessment Deteriorated [place] viral GI illness Orders: T-Hepatitis B Surface Antigen ([address]-60454) T-Hepatitis B Core Antibody (09811-91478) T-Hepatitis B Surface Antibody (29562-13086) T-Hepatitis C Antibody (57846-96295) T-HIV Antibody  (Reflex) [id number]) T-RPR (Syphilis) (02725-36644) T-Stool Giardia / Crypto- EIA (03474) T-Hepatitis Acute  Panel (25956-38756) TLB-Hepatic/Liver Function Pnl (80076-HEPATIC) TLB-Lipase (83690-LIPASE) TLB-Udip ONLY (81003-UDIP) TLB-GGT (Gamma GT) (82977-GGT) TLB-CK Total Only(Creatine Kinase/CPK) (82550-CK) TLB-Cortisol (82533-CORT)  Problem # 2:  ABDOMINAL PAIN-PERIUMBILICAL (ICD-789.05) Assessment: Deteriorated  Orders: T-Hepatitis B Surface Antigen (43329-51884) T-Hepatitis B Core Antibody (16606-30160) T-Hepatitis B Surface Antibody (10932-35573) T-Hepatitis C Antibody (22025-42706) T-HIV Antibody  (Reflex) [id number]) T-RPR (Syphilis) (76160-73710) T-Stool Giardia / Crypto- EIA (62694) TLB-Hepatic/Liver Function Pnl (80076-HEPATIC) TLB-Lipase (83690-LIPASE) TLB-Udip ONLY (81003-UDIP) TLB-GGT (Gamma GT) (82977-GGT) TLB-CK Total Only(Creatine Kinase/CPK) (82550-CK) TLB-Cortisol (82533-CORT)  Problem # 3:  GERD (ICD-530.81) Assessment: Unchanged  His updated medication list for this problem includes:    Bentyl 10 Mg Caps (Dicyclomine hcl) .Marland Kitchen... Take 1 tab twice daily    Prilosec Otc 20 Mg Tbec (Omeprazole magnesium) .Marland Kitchen... Take 1 30 min prior to breakfast    Carafate 1 Gm Tabs (Sucralfate) .Marland Kitchen... 1 by mouth qid for stomach  Problem # 4:  WEIGHT LOSS-ABNORMAL (ICD-783.21) Assessment: Comment Only  Orders: T-Hepatitis B Surface Antigen (85462-70350) T-Hepatitis B Core Antibody (09381-82993) T-Hepatitis B Surface Antibody (71696-78938) T-Hepatitis C Antibody (10175-10258) T-HIV Antibody  (Reflex) [id number]) T-RPR (Syphilis) (36144-31540) T-Stool Giardia / Crypto- EIA (08676) TLB-Hepatic/Liver Function Pnl (80076-HEPATIC) TLB-Lipase (83690-LIPASE) TLB-Udip ONLY (81003-UDIP) TLB-GGT (Gamma GT) (82977-GGT) TLB-CK Total Only(Creatine Kinase/CPK) (82550-CK) TLB-Cortisol (82533-CORT)  Complete Medication List: 1)  Vitamin D 1000 Unit Tabs (Cholecalciferol) .Marland Kitchen.. 1 by mouth qd 2)  Align Caps (Probiotic  product) .... Take 1 daily for 1 month 3)  Bentyl 10 Mg Caps  (Dicyclomine hcl) .... Take 1 tab twice daily 4)  Prilosec Otc 20 Mg Tbec (Omeprazole magnesium) .... Take 1 30 min prior to breakfast 5)  Promethazine Hcl 25 Mg Tabs (Promethazine hcl) .Marland Kitchen.. 1-2 by mouth four times a day as needed nausea 6)  Carafate 1 Gm Tabs (Sucralfate) .Marland Kitchen.. 1 by mouth qid for stomach  Other Orders: TLB-BMP (Basic Metabolic Panel-BMET) (80048-METABOL)  Patient Instructions: 1)  Please schedule a follow-up appointment in 1 month. 2)  Call if you are not better in a reasonable amount of time or if worse. Go to ER if feeling really bad!  Prescriptions: PROMETHAZINE HCL 25 MG TABS (PROMETHAZINE HCL) 1-2 by mouth four times a day as needed nausea  #60 x 1   Entered and Authorized by:   Tresa Garter MD   Signed by:   Tresa Garter MD on 03/13/2010   Method used:   Print then Give to Patient   RxID:   [id number] CARAFATE 1 GM TABS (SUCRALFATE) 1 by mouth qid for stomach  #120 x 1   Entered and Authorized by:   Tresa Garter MD   Signed by:   Tresa Garter MD on 03/13/2010   Method used:   Print then Give to Patient   RxID:   [id number] PROMETHAZINE HCL 25 MG TABS (PROMETHAZINE HCL) 1-2 by mouth four times a day as needed nausea  #60 x 1   Entered and Authorized by:   Tresa Garter MD   Signed by:   Tresa Garter MD on 03/13/2010   Method used:   Electronically to        [facility] [address] [id number]* (retail)       [address] [place], [place]  [address]       Ph: [phone]       Fax: [phone]   RxID:   [id number]

## 2010-12-11 NOTE — Letter (Signed)
Summary: Out of Work  [facility]  [address]   [place], [place] [address]   Phone: [phone]  Fax: [phone]    September 10, 2010   Employee:  Chad Spears    To Whom It May Concern:   For Medical reasons, please excuse the above named employee from work for the following dates:  Start:   09/10/10  End:   09/16/10  If he decides to return to work before then please permit him to minimize repetitive motions of his feet or heavy weight bearing activities  If you need additional information, please feel free to contact our office.         Sincerely,    Acey Lav MD

## 2010-12-11 NOTE — Assessment & Plan Note (Signed)
Summary: leg pain   Visit Type:  Follow-up Referring Provider:  na Primary Provider:  Tresa Garter MD  CC:  right leg pain, swelling and can not walk on ball of foot, and joint pain from lower back to neck.  History of Present Illness: 30 yo African man who was diagnosed with TB via pelvic  LN biopsy in [place] where he was treated. He had diffuse swelling of his right leg and pain in his foot. This responded to TB drugs. He then had recurrence of symptoms in the [place] and was seen by our group in 2001 and received 6 months of TB therapy herein [place]. He now has returned to care here and been followed by Dr. Ninetta Lights after recurrence of swelling and pain in his groin, thigh, calf and in bottom of footl. He has fevers and chills subjectively. US of the thigh failed to show anything that couuld be biospied on [place] and MRI of foot showed some "cellulitis" at base of the foot. My partner, Dr. Ninetta Lights referred him to HD for rx of TB but they apparently are not convinced this is recurrence of TB and have told him he does not need treatment (his account)  Problems Prior to Update: 1)  Insomnia, Chronic  (ICD-307.42) 2)  Abdominal Pain-epigastric  (ICD-789.06) 3)  Nausea and Vomiting  (ICD-787.01) 4)  Tuberculosis  (ICD-011.90) 5)  Night Sweats  (ICD-780.8) 6)  Nausea  (ICD-787.02) 7)  Thigh Pain  (ICD-729.5) 8)  Duodenitis  (ICD-535.60) 9)  Fecal Occult Blood  (ICD-792.1) 10)  Dysphagia  (ICD-787.29) 11)  Heartburn  (ICD-787.1) 12)  Weight Loss-abnormal  (ICD-783.21) 13)  Abdominal Pain-periumbilical  (ICD-789.05) 14)  Weight Loss  (ICD-783.21) 15)  Dysphagia Unspecified  (ICD-787.20) 16)  Gerd  (ICD-530.81) 17)  Blood in Stool  (ICD-578.1) 18)  Abdominal Pain Other Specified Site  (ICD-789.09) 19)  Arthralgia  (ICD-719.40) 20)  Bronchitis, Acute  (ICD-466.0) 21)  Hx of Tinea Pedis  (ICD-110.4) 22)  Gastroesophageal Reflux Disease  (ICD-530.81) 23)  Plantar Fasciitis, Bilateral   (ICD-728.71)  Medications Prior to Update: 1)  Promethazine Hcl 25 Mg Tabs (Promethazine Hcl) .... As Needed 2)  Colace 100 Mg Caps (Docusate Sodium) .... Take 1 Tablet By Mouth Once A Day 3)  Aciphex 20 Mg Tbec (Rabeprazole Sodium) .Marland Kitchen.. 1 By Mouth 4)  Thigh High Support Stocking .... Use As Directed 5)  Trazodone Hcl 50 Mg Tabs (Trazodone Hcl) .Marland Kitchen.. 1 By Mouth At Bedtime For Sleep  Current Medications (verified): 1)  Promethazine Hcl 25 Mg Tabs (Promethazine Hcl) .... As Needed 2)  Colace 100 Mg Caps (Docusate Sodium) .... Take 1 Tablet By Mouth Once A Day 3)  Aciphex 20 Mg Tbec (Rabeprazole Sodium) .Marland Kitchen.. 1 By Mouth 4)  Thigh High Support Stocking .... Use As Directed 5)  Trazodone Hcl 50 Mg Tabs (Trazodone Hcl) .Marland Kitchen.. 1 By Mouth At Bedtime For Sleep  Allergies: 1)  ! * Contrast Dye   Preventive Screening-Counseling & Management  Alcohol-Tobacco     Alcohol drinks/day: 0     Smoking Status: never  Caffeine-Diet-Exercise     Caffeine use/day: 0     Does Patient Exercise: yes  Safety-Violence-Falls     Seat Belt Use: yes   Current Allergies (reviewed today): ! * CONTRAST DYE Past History:  Past Medical History: Hx of TINEA PEDIS (ICD-110.4) GASTROESOPHAGEAL REFLUX DISEASE (ICD-530.81) PLANTAR FASCIITIS, BILATERAL (ICD-728.71) Tuberculosis 1998 [place], rx again in 2001 Hep B SAb/CAb+ DUDODENITIS Lymphedema in the right  leg  Review of Systems       The patient complains of fever.  The patient denies anorexia, weight loss, weight gain, vision loss, decreased hearing, hoarseness, chest pain, syncope, dyspnea on exertion, peripheral edema, prolonged cough, headaches, hemoptysis, abdominal pain, melena, hematochezia, severe indigestion/heartburn, hematuria, incontinence, genital sores, muscle weakness, suspicious skin lesions, transient blindness, difficulty walking, depression, unusual weight change, abnormal bleeding, and enlarged lymph nodes.    Vital Signs:  Patient  profile:   30 year old male Height:      71 inches (180.34 cm) Weight:      205.12 pounds (93.24 kg) BMI:     28.71 Temp:     98.4 degrees F (36.89 degrees C) oral Pulse rate:   66 / minute BP sitting:   107 / 67  (left arm)  Vitals Entered By: Baxter Hire) (June 26, 2010 3:04 PM) CC: right leg pain,swelling and can not walk on ball of foot,joint pain from lower back to neck Pain Assessment Patient in pain? yes     Location: right leg Intensity: 9 Type: burning Onset of pain  Constant Nutritional Status BMI of 25 - 29 = overweight Nutritional Status Detail appetite is okay per patient  Does patient need assistance? Functional Status Self care Ambulation Normal   Physical Exam  General:  alert, well-developed, well-nourished, and well-hydrated.   Head:  normocephalic, atraumatic, and no abnormalities observed.   Eyes:  pupils equal, pupils round, and pupils reactive to light.   Ears:  no external deformities.   Nose:  no external deformity.   Mouth:  pharynx pink and moist.   Lungs:  normal respiratory effort and normal breath sounds.   Heart:  normal rate, regular rhythm, and no murmur.   Abdomen:  soft, non-tender, and normal bowel sounds.   Extremities:  He has extensive 3+ edema in the right thigh and calve Neurologic:  alert & oriented X3 and strength normal in all extremities.   Skin:  he has 3+ edema on the right, area of LN dissection is tender and enlarged, foot with discoloration on base with tenderness to palpation   Inguinal Nodes:  lymph node in inguinal area enlarged and tender Psych:  Oriented X3 and normally interactive.     Impression & Recommendations:  Problem # 1:  LYMPHEDEMA, RIGHT LEG (ICD-457.1) Assessment Deteriorated Would seem unusual to for him to have recurrence of MS TB with LA for 3rd time over period of 13 years. I will check MRI of the thigh. Will check filaria antibodies. ? if he also might have chronic lymphedema after LN  dissection--but it has waxed and waned adn now he has fevers by report. Orders: T- * Misc. Laboratory test [phone]) MRI with & without Contrast (MRI w&w/o Contrast) T-Comprehensive Metabolic Panel [phone]) T-CBC w/Diff ([id number]) T-C-Reactive Protein (13086-57846) T-Sed Rate (Automated) ([address]13) Est. Patient Level IV (24401)  Problem # 2:  TUBERCULOSIS (ICD-011.90)  see above  Orders: Est. Patient Level IV ([address])  Problem # 3:  NIGHT SWEATS (ICD-780.8)  see above discussion  Orders: Est. Patient Level IV ([address])  Patient Instructions: 1)  I will get blood work today and mri in next few days 2)  Please schedule fu appt in one month 3)  we will get in touch with you in meantime based on lab results

## 2010-12-11 NOTE — Miscellaneous (Signed)
Summary: Recovery  Clinical Lists Changes  Medications: Added new medication of PRILOSEC 20 MG CPDR (OMEPRAZOLE) Use once daily 1/2 hr before meal - Signed Rx of PRILOSEC 20 MG CPDR (OMEPRAZOLE) Use once daily 1/2 hr before meal;  #30 x 0;  Signed;  Entered by: Clide Cliff RN;  Authorized by: Hart Carwin MD;  Method used: Electronically to [id number]*, [address], [place], [place]  [address], Ph: [phone], Fax: [phone]    Prescriptions: PRILOSEC 20 MG CPDR (OMEPRAZOLE) Use once daily 1/2 hr before meal  #30 x 0   Entered by:   Clide Cliff RN   Authorized by:   Hart Carwin MD   Signed by:   Clide Cliff RN on 03/08/2010   Method used:   Electronically to        [facility] [address] [id number]* (retail)       [address] [place], [place]  [address]       Ph: [phone]       Fax: [phone]   RxID:   [id number]

## 2010-12-11 NOTE — Progress Notes (Signed)
Summary: Triage  Phone Note Call from Patient Call back at Home Phone [phone]   Caller: Patient Call For: Dr. Juanda Chance Reason for Call: Talk to Nurse Details for Reason: triage Summary of Call: Pt. called still complaining of severe abdominal pain.  "Hurts to the touch on both sides of stomach."  Can't eat, can't sleep, has a feeling of fullness. Been to ER who suggested he follow-up with Dr. Juanda Chance. Has an appt. scheduled 12/8 but feels he needs to be seen sooner.  Please call patient and advise. Initial call taken by: Schuyler Amor,  October 01, 2010 9:40 AM  Follow-up for Phone Call        Patient calling to report he continues to have stomach pain all across his stomach it feels like "needles." Patient reports feeling full all the time, vomiting when he eats, nausea and constipation. He states he did have a BM this AM. Patient states the medication he is taking does not help but he cannot tell me what he is taking. Patient was seen in the ER on 09/28/10(report in EMR) and was seen by PCP that day also. He is scheduled to be seen on 10/18/10 but is wanting to be seen earlier. Please, advise. Follow-up by: Jesse Fall RN,  October 01, 2010 11:58 AM  Additional Follow-up for Phone Call Additional follow up Details #1::        I cannot fing anything wrong with this pt. I will see him again but I don't have a place to put him on my schedule. If somebody cancels we will call him Please write his name down so You could call him. Additional Follow-up by: Hart Carwin MD,  October 01, 2010 1:34 PM     Appended Document: Triage Patient on cancellation list. Patient aware.

## 2010-12-11 NOTE — Progress Notes (Signed)
Summary: NEEDS LABS  Phone Note Call from Patient   Caller: Patient Summary of Call: pt called stating his heart is "failing" and he is having chest pain, abd pain and dyspnea.  I advised him to go to nearest ER.  Pt understands Initial call taken by: Lanier Prude, [facility]),  August 15, 2010 8:15 AM  Follow-up for Phone Call        I can see him if he is available Follow-up by: Tresa Garter MD,  August 15, 2010 1:10 PM  Additional Follow-up for Phone Call Additional follow up Details #1::        Patient was seen in ER. Per MD, Pt needs cortisol challenge test. Needs cortisol level at lab then come upstairs for cortisol injection then 45 minutes later goes to lab for cortisol level.  Additional Follow-up by: Lamar Sprinkles, CMA,  August 15, 2010 2:21 PM    Additional Follow-up for Phone Call Additional follow up Details #2::    Pt informed, order in IDX for labs and nurse visit. Follow-up by: Lamar Sprinkles, CMA,  August 15, 2010 3:21 PM

## 2010-12-11 NOTE — Procedures (Signed)
Summary: Upper Endoscopy  Patient: [name] Note: All result statuses are Final unless otherwise noted.  Tests: (1) Upper Endoscopy (EGD)   EGD Upper Endoscopy       DONE     [facility]     [address]     [place], [place]  [address]           ENDOSCOPY PROCEDURE REPORT           PATIENT:  Chad, Spears  MR#:  [id number]     BIRTHDATE:  17-Apr-1981, 29 yrs. old  GENDER:  male           ENDOSCOPIST:  Hedwig Morton. Juanda Chance, MD     Referred by:  Linda Hedges Plotnikov, M.D.           PROCEDURE DATE:  03/08/2010     PROCEDURE:  EGD with biopsy     ASA CLASS:  Class I     INDICATIONS:  abdominal pain, dysphagia, weight loss           MEDICATIONS:   Versed 7 mg, Fentanyl 50 mcg     TOPICAL ANESTHETIC:  Exactacain Spray           DESCRIPTION OF PROCEDURE:   After the risks benefits and     alternatives of the procedure were thoroughly explained, informed     consent was obtained.  The [facility] GIF-H180 [id number] endoscope was     introduced through the mouth and advanced to the second portion of     the duodenum, without limitations.  The instrument was slowly     withdrawn as the mucosa was fully examined.     <<PROCEDUREIMAGES>>           Duodenitis was found. multiple superficial erosions With standard     forceps, a biopsy was obtained and sent to pathology (see image2     and image4).  Otherwise the examination was normal (see image8,     image7, image6, image5, and image1).    Retroflexed views revealed     no abnormalities.    The scope was then withdrawn from the patient     and the procedure completed.           COMPLICATIONS:  None           ENDOSCOPIC IMPRESSION:     1) Duodenitis     2) Otherwise normal examination     s/p biopsies     RECOMMENDATIONS:     1) Await biopsy results     PPI     CT scan pending r/o pancreatitis     no NSAID's, alcohol     amylase, lipase           REPEAT EXAM:  In 0 year(s) for.           ______________________________     Hedwig Morton.  Juanda Chance, MD           CC:           n.     eSIGNED:   Hedwig Morton. Rahim Astorga at 03/08/2010 09:09 AM           Parke Simmers, [id number]  Note: An exclamation mark (!) indicates a result that was not dispersed into the flowsheet. Document Creation Date: 03/08/2010 9:10 AM _______________________________________________________________________  (1) Order result status: Final Collection or observation date-time: 03/08/2010 09:02 Requested date-time:  Receipt date-time:  Reported date-time:  Referring Physician:   Ordering Physician: Lina Sar (  [id number]) Specimen Source:  Source: Launa Grill Order Number: [id number] Lab site:

## 2010-12-11 NOTE — Letter (Signed)
Summary: Patient [facility] Biopsy Results  [facility]  [address] [place], [place] [address]   Phone: [phone]  Fax: [phone]        March 10, 2010 MRN: [id number]    [facility] [address] [name], [place]  [address]    Dear Mr. CONA,  I am pleased to inform you that the biopsies taken during your recent endoscopic examination did not show any evidence of cancer upon pathologic examination.  Additional information/recommendations:  __No further action is needed at this time.  Please follow-up with      your primary care physician for your other healthcare needs.  __ Please call [phone] to schedule a return visit to review      your condition.  _x_ Continue with the treatment plan as outlined on the day of your      exam.  __ You should have a repeat endoscopic examination for this problem              in _ months/years.   Please call us if you are having persistent problems or have questions about your condition that have not been fully answered at this time.  Sincerely,  Hart Carwin MD  This letter has been electronically signed by your physician.  Appended Document: Patient Notice-Endo Biopsy Results letter mailed 5.5.11

## 2010-12-11 NOTE — Progress Notes (Signed)
Summary: Patient wants mri results/TY  Phone Note Call from Patient   Caller: Patient Call For: Dr.Van Dam Summary of Call: Patient wants to know the results of both MRI Initial call taken by: Starleen Arms CMA,  July 02, 2010 9:42 AM  Follow-up for Phone Call        His MRI did not show evidence of bone infection. I discussed the case with Ward Roxan Hockey at the health dept and I would like to bring Chad Spears back in to discuss his case again and then figure ot if we need to purse other tests vs watch him. I have appt open on 831 Follow-up by: Acey Lav MD,  July 02, 2010 10:11 AM

## 2010-12-11 NOTE — Progress Notes (Signed)
Summary: Results  Phone Note Call from Patient Call back at Home Phone [phone]   Summary of Call: Patient is requesting results of recent labs.  Initial call taken by: Lamar Sprinkles, CMA,  Mar 16, 2010 1:15 PM  Follow-up for Phone Call        complex set of labs for vomiting. Essentially all labs are negative for any acute illness. He needs to see Dr. Posey Rea to review all the labs in detail.  Follow-up by: Jacques Navy MD,  Mar 16, 2010 1:50 PM  Additional Follow-up for Phone Call Additional follow up Details #1::        left mess to call office back..............Marland KitchenLamar Sprinkles, CMA  Mar 16, 2010 6:08 PM   lm to call back,Follow up scheduled  for 6/3 per EMR..closing phone note.Alvy Beal Archie CMA  Mar 19, 2010 11:37 AM

## 2010-12-11 NOTE — Assessment & Plan Note (Signed)
Summary: ROA/PER DR PLOTNIKOV/JSS   Vital Signs:  Patient profile:   30 year old male Height:      71 inches Weight:      200 pounds BMI:     28.00 Temp:     99.5 degrees F oral Pulse rate:   80 / minute Pulse rhythm:   regular Resp:     16 per minute BP sitting:   102 / 82  (right arm) Cuff size:   regular  Vitals Entered By: Lanier Prude, CMA(AAMA) (August 16, 2010 9:35 AM) CC: abdominal pain Comments was given Miralax and 2 other meds at ER yest and they arent helping.  He needs to go to lab to have Cortisol labs drawn again at 10:05am...SS/CMA   Primary Care Provider:  Tresa Garter MD  CC:  abdominal pain.  History of Present Illness: He walked in to be seen for CP and yawning; abd bloating and burning. He is here today primarily for cortrosyn stim test. C/o constipation - no stool x several days...  Current Medications (verified): 1)  Vitamin D 1000 Unit Tabs (Cholecalciferol) .Marland Kitchen.. 1 By Mouth Qd 2)  Amitiza 24 Mcg Caps (Lubiprostone) .Marland Kitchen.. 1 By Mouth Once Daily As Needed Constipation 3)  Zofran Odt 4 Mg Tbdp (Ondansetron) .... Take Every Six Hours As Needed  Allergies (verified): 1)  ! 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) Tuberculosis 1998 [place], rx again in 2001 Hep B SAb/CAb+ DUDODENITIS Lymphedema in the right leg IBS-C Psychosomatic disorder Anxiety  Social History: Last updated: 03/05/2010 Occupation: [employer] Single Never Smoked Alcohol use-no Drug use-no Regular exercise-yes  Review of Systems       The patient complains of abdominal pain.  The patient denies fever.    Physical Exam  General:  alert, well-developed, well-nourished, well-hydrated, appropriate dress, normal appearance, healthy-appearing, cooperative to examination, and good hygiene.   Mouth:   WNL Lungs:  Normal respiratory effort, chest expands symmetrically. Lungs are clear to auscultation, no crackles or wheezes. Heart:  Normal rate and regular rhythm. S1 and S2 normal without gallop, murmur, click, rub or other extra sounds. Abdomen:  soft, non-tender, normal bowel sounds, no distention, no masses, no guarding, no rigidity, no rebound tenderness, no abdominal hernia, no inguinal hernia, no hepatomegaly, and no splenomegaly.  Sensitive Msk:  No deformity or scoliosis noted of thoracic or lumbar spine.  R groin is sensitive to palp Neurologic:  No cranial nerve deficits noted. Station and gait are normal. Plantar reflexes are down-going bilaterally. DTRs are symmetrical throughout. Sensory, motor and coordinative functions appear intact. Skin:  No rash Psych:  Cognition and judgment appear intact. Alert and cooperative with normal attention span and concentration. No apparent delusions, illusions, hallucinations depressed affect.     Impression & Recommendations:  Problem # 1:  ABDOMINAL PAIN-EPIGASTRIC (ICD-789.06) Assessment Unchanged His cortisol was low. It was nl this past spring. His updated medication list for this problem includes:    Amitiza 24 Mcg Caps (Lubiprostone) .Marland Kitchen... 1 by mouth once daily as needed constipation  Problem # 2:  CHEST PAIN (ICD-786.50) related to #1 Assessment: New  Problem # 3:  CONSTIPATION, CHRONIC (ICD-564.09) - this was  Assessment: Unchanged  Miralax, Amitiza  His updated medication list for this problem includes:    Milk of Magnesia 400 Mg/34ml Susp (Magnesium hydroxide) ..... Use as dirrected for constipation  Problem # 4:  Single abn low  cortisol reading with nl cortrosyn stim test Assessment: New I'm not sure if giving empiric Hydrocortisone would be reasonable...  Complete Medication List: 1)  Vitamin D 1000 Unit Tabs (Cholecalciferol) .Marland Kitchen.. 1 by mouth qd 2)  Amitiza 24 Mcg Caps (Lubiprostone) .Marland Kitchen.. 1 by mouth once daily as  needed constipation 3)  Zofran Odt 4 Mg Tbdp (Ondansetron) .... Take every six hours as needed 4)  Milk of Magnesia 400 Mg/65ml Susp (Magnesium hydroxide) .... Use as dirrected for constipation  Patient Instructions: 1)  Keep return office visit  Prescriptions: MILK OF MAGNESIA 400 MG/5ML SUSP (MAGNESIUM HYDROXIDE) use as dirrected for constipation  #1 x 3   Entered and Authorized by:   Tresa Garter MD   Signed by:   Tresa Garter MD on 08/16/2010   Method used:   Print then Give to Patient   RxID:   [id number]

## 2010-12-11 NOTE — Assessment & Plan Note (Signed)
Summary: 11month f/u [mkj]   Visit Type:  Follow-up Referring Provider:  na Primary Provider:  Tresa Garter MD  CC:  1 month follow up.  History of Present Illness: 30 yo with questionabe hx of TB in [place], diagnosed on LN biopsy. 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 He has suffered from lymphedema on right side where he had prior exicisional LN biopsy and also claimed to be having night sweats without fevers. His ACE level was elevated. He had FNA by IR which showed benign reactive tissue without granuLOMAS.  His AFB ,fungal and bacterial cultures without growht. He continues to have pain in his thigh and groin. We discussed biopsy of his foot but he wanted to wait until next week when he would have a larger time off from work.   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) 7)  Inguinal Lymphadenopathy, Right  (ICD-785.6) 8)  Back Pain, Lumbar  (ICD-724.2) 9)  Leg Pain, Right  (ICD-729.5) 10)  Upper Respiratory Infection, Acute  (ICD-465.9) 11)  Lymphedema, Right Leg  (ICD-457.1) 12)  Insomnia, Chronic  (ICD-307.42) 13)  Abdominal Pain-epigastric  (ICD-789.06) 14)  Nausea and Vomiting  (ICD-787.01) 15)  Tuberculosis  (ICD-011.90) 16)  Night Sweats  (ICD-780.8) 17)  Nausea  (ICD-787.02) 18)  Thigh Pain  (ICD-729.5) 19)  Duodenitis  (ICD-535.60) 20)  Fecal Occult Blood  (ICD-792.1) 21)  Dysphagia  (ICD-787.29) 22)  Heartburn  (ICD-787.1) 23)  Weight Loss-abnormal  (ICD-783.21) 24)  Abdominal Pain-periumbilical  (ICD-789.05) 25)  Weight Loss  (ICD-783.21) 26)  Dysphagia Unspecified  (ICD-787.20) 27)  Gerd  (ICD-530.81) 28)  Blood in Stool  (ICD-578.1) 29)  Abdominal Pain Other Specified Site  (ICD-789.09) 30)  Arthralgia  (ICD-719.40) 31)  Bronchitis, Acute   (ICD-466.0) 32)  Hx of Tinea Pedis  (ICD-110.4) 33)  Gastroesophageal Reflux Disease  (ICD-530.81) 34)  Plantar Fasciitis, Bilateral  (ICD-728.71)  Medications Prior to Update: 1)  Vitamin D 1000 Unit Tabs (Cholecalciferol) .Marland Kitchen.. 1 By Mouth Qd 2)  Amitiza 24 Mcg Caps (Lubiprostone) .Marland Kitchen.. 1 By Mouth Once Daily As Needed Constipation 3)  Zofran Odt 4 Mg Tbdp (Ondansetron) .... Take Every Six Hours As Needed 4)  Milk of Magnesia 400 Mg/5ml Susp (Magnesium Hydroxide) .... Use As Dirrected For Constipation 5)  Tramadol Hcl 50 Mg Tabs (Tramadol Hcl) .... Take 1-2 Tablets Every 6 Hours As Needed For Pain  Current Medications (verified): 1)  Vitamin D 1000 Unit Tabs (Cholecalciferol) .Marland Kitchen.. 1 By Mouth Qd 2)  Amitiza 24 Mcg Caps (Lubiprostone) .Marland Kitchen.. 1 By Mouth Once Daily As Needed Constipation 3)  Zofran Odt 4 Mg Tbdp (Ondansetron) .... Take Every Six Hours As Needed 4)  Milk of Magnesia 400 Mg/54ml Susp (Magnesium Hydroxide) .... Use As Dirrected For Constipation 5)  Tramadol Hcl 50 Mg Tabs (Tramadol Hcl) .... Take 1-2 Tablets Every 6 Hours As Needed For Pain  Allergies: 1)  ! * Contrast Dye   Preventive Screening-Counseling & Management  Alcohol-Tobacco     Alcohol drinks/day: 0     Smoking Status: never  Caffeine-Diet-Exercise     Caffeine use/day: 0     Does Patient Exercise: yes  Safety-Violence-Falls     Seat Belt Use: yes   Current Allergies (reviewed today): ! * CONTRAST DYE Past History:  Past  Medical History: Last updated: 08/09/2010 Hx of TINEA PEDIS (ICD-110.4) GASTROESOPHAGEAL REFLUX DISEASE (ICD-530.81) PLANTAR FASCIITIS, BILATERAL (ICD-728.71) Tuberculosis 1998 [place], rx again in 2001 Hep B SAb/CAb+ DUDODENITIS Lymphedema in the right leg IBS-C Psychosomatic disorder Anxiety  Past Surgical History: Last updated: 03/21/2010 R groin ?TB infection surgery - remote completed in [place] Right foot surgery-completed in [place]  Family History: Last updated:  02/12/2010 Unknown  Social History: Last updated: 03/05/2010 Occupation: [employer] Single Never Smoked Alcohol use-no Drug use-no Regular exercise-yes  Risk Factors: Alcohol Use: 0 (09/04/2010) Caffeine Use: 0 (09/04/2010) Exercise: yes (09/04/2010)  Risk Factors: Smoking Status: never (09/04/2010)  Family History: Reviewed history from 02/12/2010 and no changes required. Unknown  Social History: Reviewed history from 03/05/2010 and no changes required. 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 Alcohol use-no Drug use-no Regular exercise-yes  Review of Systems  The patient denies anorexia, fever, weight loss, weight gain, vision loss, decreased hearing, hoarseness, chest pain, syncope, dyspnea on exertion, peripheral edema, prolonged cough, headaches, hemoptysis, abdominal pain, melena, hematochezia, severe indigestion/heartburn, hematuria, incontinence, genital sores, muscle weakness, suspicious skin lesions, transient blindness, difficulty walking, depression, unusual weight change, abnormal bleeding, and enlarged lymph nodes.    Vital Signs:  Patient profile:   30 year old male Height:      71 inches (180.34 cm) Weight:      197.12 pounds (89.60 kg) BMI:     27.59 Temp:     98.6 degrees F (37.00 degrees C) oral Pulse rate:   69 / minute BP sitting:   103 / 70  (left arm)  Vitals Entered By: Baxter Hire) (September 04, 2010 2:58 PM) CC: 1 month follow up Pain Assessment Patient in pain? yes     Location: right leg Intensity: 7 Type: aching Onset of pain  Constant Nutritional Status BMI of 25 - 29 = overweight Nutritional Status Detail appetite is okay per patient  Does patient need assistance? Functional Status Self care Ambulation Normal   Physical Exam  General:  alert, well-developed, well-nourished, and well-hydrated.   Head:  normocephalic, atraumatic, and no abnormalities observed.   Eyes:  pupils equal, pupils round,  and pupils reactive to light.   Ears:  no external deformities.   Nose:  no external deformity.   Mouth:  pharynx pink and moist.   Neck:  has lipoma on his back that has appeared in past several months Lungs:  normal respiratory effort and normal breath sounds.   Heart:  normal rate, regular rhythm, and no murmur.   Abdomen:  soft, non-tender, and normal bowel sounds.   Neurologic:  alert & oriented X3 and strength normal in all extremities.   Skin:  he has 3+ edema on the right, area of LN tender to palpation Psych:  Oriented X3 and normally interactive.     Impression & Recommendations:  Problem # 1:  INGUINAL LYMPHADENOPATHY, RIGHT (ICD-785.6)  I doubt very much this is TB. At most this might be sarcoid. I will try to biopsy his foot next week for path and culture  Orders: Est. Patient Level IV (65784)  Problem # 2:  LYMPHEDEMA, RIGHT LEG (ICD-457.1)  likely secondary to excisional LN biopsy> I am willing to give him a trial of steroids for sarcoid  Orders: Est. Patient Level IV (69629)  Problem # 3:  TUBERCULOSIS (ICD-011.90)  I dont htink he has ANY evidence of acgtive TB whatsoever  Orders: Est. Patient Level IV ([id number])  Patient Instructions: 1)  rtc on  Monday afternoon for biospies of your right foot

## 2010-12-13 NOTE — Progress Notes (Signed)
Summary: REQ A CALL FROM MD  Phone Note Call from Patient Call back at Home Phone [phone]   Summary of Call: Patient is requesting a call back from Dr Posey Rea.  Initial call taken by: Lamar Sprinkles, CMA,  October 24, 2010 1:53 PM  Follow-up for Phone Call        Chad Spears called back concerning a CTscan that he spoke with Dr. Posey Rea about. Patient wants to know when he can do the CT scan. Follow-up by: Daphane Shepherd,  October 29, 2010 12:52 PM  Additional Follow-up for Phone Call Additional follow up Details #1::        We will sch MRI of the thoracic spine Additional Follow-up by: Tresa Garter MD,  November 06, 2010 1:24 PM    Additional Follow-up for Phone Call Additional follow up Details #2::    Pt informed  Follow-up by: Lamar Sprinkles, CMA,  November 06, 2010 1:38 PM

## 2010-12-13 NOTE — Assessment & Plan Note (Signed)
Summary: 6 wk f/u #/cd   Vital Signs:  Patient profile:   30 year old male Height:      71 inches Weight:      201 pounds BMI:     28.14 Temp:     99.5 degrees F oral Pulse rate:   76 / minute Pulse rhythm:   regular Resp:     16 per minute BP sitting:   112 / 80  (left arm) Cuff size:   regular  Vitals Entered By: Lanier Prude, CMA(AAMA) (November 30, 2010 11:15 AM) CC: 6 wk f/u  Is Patient Diabetic? No   Primary Care Provider:  Tresa Garter MD  CC:  6 wk f/u .  History of Present Illness: C/o abd pain as before - may had been better on hydrocort, however not sure... He does not recall taking Prednisone and can't recall if it made him better. He is a very poor historian.. Nevertheless he is focused on his abdominal pains and side pains... He brings empty bottles of Aciphex and HCT with him. C/o HAs, eyes hurt, nauseated etc C/o bloody and white nasal d/c and sinus pain x 2 wks...  Current Medications (verified): 1)  Vitamin D 1000 Unit Tabs (Cholecalciferol) .Marland Kitchen.. 1 By Mouth Qd 2)  Amitiza 24 Mcg Caps (Lubiprostone) .Marland Kitchen.. 1 By Mouth Once Daily As Needed Constipation 3)  Zofran Odt 4 Mg Tbdp (Ondansetron) .... Take Every Six Hours As Needed 4)  Tramadol Hcl 50 Mg Tabs (Tramadol Hcl) .... Take 1-2 Tablets Every 6 Hours As Needed For Pain 5)  Buspirone Hcl 15 Mg Tabs (Buspirone Hcl) .Marland Kitchen.. 1 By Mouth Two Times A Day For Anxiety 6)  Miralax  Powd (Polyethylene Glycol 3350) .... Take 1 Capful (17 Grams) Dissolved in At Least 8 Ounces of Water/juice and Drink Twice Daily. May Titrate Dose As Needed 7)  Aciphex 20 Mg Tbec (Rabeprazole Sodium) .Marland Kitchen.. 1 By Mouth 8)  Prednisone 20 Mg Tabs (Prednisone) .... Take 1 Tablet By Mouth Once A Day 9)  Cortef 10 Mg Tabs (Hydrocortisone) .Marland Kitchen.. 1 By Mouth Two Times A Day  Allergies (verified): 1)  ! 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.71) Tuberculosis 1998 [place], rx again in 2001(of note he had evdence of calcified lymph nodes in abdomen as well as some duodenal inflammation)  Hep B SAb/CAb+ DUDODENITIS Lymphedema in the right leg IBS Psychosomatic disorder Anxiety Headaches   Past Surgical History: Last updated: 03/21/2010 R groin ?TB infection surgery - remote completed in [place] Right foot surgery-completed in [place]  Social History: Last updated: 03/05/2010 Occupation: [employer] Single Never Smoked Alcohol use-no Drug use-no Regular exercise-yes  Physical Exam  General:  Non-toxic, alert, well-developed, well-nourished, well-hydrated, appropriate dress, looks tired and slow, cooperative to examination, and with good hygiene.   Nose:  External nasal examination shows bloody d/c Mouth:  Erythematous throat and intranasal mucosa c/w URI  Neck:  supple, full ROM, no masses, no thyromegaly, no thyroid nodules or tenderness, no JVD, normal carotid upstroke, and no cervical lymphadenopathy.   Lungs:  Normal respiratory effort, chest expands symmetrically. Lungs are clear to auscultation, no crackles or wheezes. Heart:  Normal rate and regular rhythm. S1 and S2 normal without gallop, murmur, click, rub or other extra sounds. Abdomen:  soft, sensitive, normal bowel sounds, no distention, no masses, no guarding, no rigidity, no rebound tenderness, no  abdominal hernia, no inguinal hernia, no hepatomegaly, and no splenomegaly.   Msk:  No deformity or scoliosis noted of thoracic or lumbar spine.   Neurologic:  No cranial nerve deficits noted. Station and gait are normal. Plantar reflexes are down-going bilaterally. DTRs are symmetrical throughout. Sensory, motor and coordinative functions appear intact. Skin:  turgor normal, color normal, no rashes, no suspicious lesions, no ecchymoses, no petechiae, and no purpura.   Psych:  Oriented X3, memory intact for recent  and remote, normally interactive, good eye contact, not anxious appearing, not depressed appearing, not agitated, not suicidal, and not homicidal.     Impression & Recommendations:  Problem # 1:  HEADACHE (ICD-784.0) Assessment Unchanged  His updated medication list for this problem includes:    Tramadol Hcl 50 Mg Tabs (Tramadol hcl) .Marland Kitchen... 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 2 caps by mouth bid    Sudafed 12 Hour 120 Mg Xr12h-tab (Pseudoephedrine hcl) .Marland Kitchen... 1 by mouth two times a day as needed allergies  Problem # 3:  ANXIETY (ICD-300.00) Assessment: Unchanged  His updated medication list for this problem includes:    Buspirone Hcl 15 Mg Tabs (Buspirone hcl) .Marland Kitchen... 1 by mouth two times a day for anxiety RESTART  Problem # 4:  SOMATIZATION DISORDER (ICD-300.81) Assessment: Unchanged  Problem # 5:  ABDOMINAL PAIN-EPIGASTRIC (ICD-789.06) Assessment: Unchanged Try Gluten Free Diet His updated medication list for this problem includes:    Amitiza 24 Mcg Caps (Lubiprostone) .Marland Kitchen... 1 by mouth once daily as needed constipation  Problem # 6:  HEARTBURN (ICD-787.1) Assessment: Unchanged On the regimen of medicine(s) reflected in the chart    Complete Medication List: 1)  Vitamin D 1000 Unit Tabs (Cholecalciferol) .Marland Kitchen.. 1 by mouth qd 2)  Amitiza 24 Mcg Caps (Lubiprostone) .Marland Kitchen.. 1 by mouth once daily as needed constipation 3)  Zofran Odt 4 Mg Tbdp (Ondansetron) .... Take every six hours as needed 4)  Tramadol Hcl 50 Mg Tabs (Tramadol hcl) .... Take 1-2 tablets every 6 hours as needed for pain 5)  Buspirone Hcl 15 Mg Tabs (Buspirone hcl) .Marland Kitchen.. 1 by mouth two times a day for anxiety 6)  Miralax Powd (Polyethylene glycol 3350) .... Take 1 capful (17 grams) dissolved in at least 8 ounces of  water/juice and drink twice daily. may titrate dose as needed 7)  Aciphex 20 Mg Tbec (Rabeprazole sodium) .Marland Kitchen.. 1 by mouth 8)  Prednisone 20 Mg Tabs (Prednisone) .... Take 1 tablet by mouth once a day 9)  Amoxicillin 500 Mg Caps (Amoxicillin) .... 2 caps by mouth bid 10)  Sudafed 12 Hour 120 Mg Xr12h-tab (Pseudoephedrine hcl) .Marland Kitchen.. 1 by mouth two times a day as needed allergies  Patient Instructions: 1)  Try Gluten Free Diet 2)  Restart Buspar (Buspirone) 3)  Please schedule a follow-up appointment in 2 months. 4)  Call if you are not better in a reasonable amount of time or if worse.  Prescriptions: BUSPIRONE HCL 15 MG TABS (BUSPIRONE HCL) 1 by mouth two times a day for anxiety  #60 x 6   Entered and Authorized by:   Tresa Garter MD   Signed by:   Tresa Garter MD on 11/30/2010   Method used:   Electronically to        [facility] [address] #[id number]* (  retail)       [address] [place], [place]  [address]       Ph: [phone]       Fax: [phone]   RxID:   [id number] SUDAFED 12 HOUR 120 MG XR12H-TAB (PSEUDOEPHEDRINE HCL) 1 by mouth two times a day as needed allergies  #60 x 1   Entered and Authorized by:   Tresa Garter MD   Signed by:   Tresa Garter MD on 11/30/2010   Method used:   Electronically to        [facility] [address] #[id number]* (retail)       [address] [place], [place]  [address]       Ph: [phone]       Fax: [phone]   RxID:   [id number] AMOXICILLIN 500 MG CAPS (AMOXICILLIN) 2 caps by mouth bid  #40 x 0   Entered and Authorized by:   Tresa Garter MD   Signed by:   Tresa Garter MD on 11/30/2010   Method used:   Electronically to        [facility] [address] #[id number]* (retail)       [address] [place], [place]  [address]       Ph: [phone]       Fax: [phone]   RxID:   [id number]    Orders Added: 1)  Est. Patient Level IV [33825]

## 2010-12-13 NOTE — Progress Notes (Signed)
Summary: REFERRAL  Phone Note Other Incoming   Caller: Pt Details for Reason: ref to [facility] Summary of Call: Pt states he has been having stomach issues and had spoken with [facility]. He wants to sch an appt with a Gastroenterologist there but will need a ref. Number to facility is [phone] [facility] Initial call taken by: Ami Bullins CMA,  December 04, 2010 10:05 AM  Follow-up for Phone Call        ok Follow-up by: Tresa Garter MD,  December 04, 2010 6:06 PM  Additional Follow-up for Phone Call Additional follow up Details #1::        [phone][facility]  appt scheduled for jan 30 ,2012 mon @11 :00 Dr Merri Brunette -pt  informed Shelbie Proctor  December 05, 2010 3:34 PM

## 2010-12-13 NOTE — Assessment & Plan Note (Signed)
Summary: F/U/VS   Visit Type:  Follow-up Referring Provider:  na Primary Provider:  Tresa Garter MD  CC:  f/u.  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 He had FNA by IR which showed benign reactive tiss ue without granuLOMAS.  His AFB ,fungal and bacterial cultures without growht. He continues to have pain in his thigh and groin. I performed biopsy on  hyperpigmented area on his foot which was unrevealing and fungal and afb cultures have been negative. I started him on prednisone and he feels that while on it his edema and pain in legs has improved. He is at 40mg  per day at present. His night sweats have gone as well. He has seen podiatrist--but cannot recall name and they are treating his feet with brace adn planning surgery.Foot Docter in [facility]   Current Allergies (reviewed today): ! * CONTRAST DYE Past History:  Past Medical History: Last updated: 10/16/2010 Hx of TINEA PEDIS (ICD-110.4) GASTROESOPHAGEAL REFLUX DISEASE (ICD-530.81) PLANTAR FASCIITIS, BILATERAL (ICD-728.71) Tuberculosis 1998 [place], rx again in 2001(of note he had evdence of calcified lymph nodes in abdomen as well as some duodenal inflammation)  Hep B SAb/CAb+ DUDODENITIS Lymphedema in the right leg IBS Psychosomatic disorder Anxiety Headaches   Past Surgical History: Last updated: 03/21/2010 R groin ?TB infection surgery - remote completed in [place] Right foot surgery-completed in [place]  Family History: Last updated: 02/12/2010 Unknown  Social  History: Last updated: 03/05/2010 Occupation: [employer] Single Never Smoked Alcohol use-no Drug use-no Regular exercise-yes  Risk Factors: Alcohol Use: 0 (10/16/2010) Caffeine Use: 0 (09/04/2010) Exercise: yes (09/04/2010)  Risk Factors: Smoking Status: never (10/16/2010)  Family History: Reviewed history from 02/12/2010 and no changes required. Unknown  Social History: Reviewed history from 03/05/2010 and no changes required. 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 No Pain Assessment Patient in pain? no      Nutritional Status BMI of 25 - 29 = overweight Nutritional Status Detail nl  Does patient need assistance? Functional Status Self care Ambulation Normal   Physical Exam  General:  alert, well-developed, well-nourished, and well-hydrated.   Head:  normocephalic and atraumatic.   Eyes:  vision grossly intact, pupils equal, pupils round, and pupils reactive to light.   Mouth:  good dentition, pharynx pink and moist, no erythema, and no exudates.   Neck:  supple and full ROM.   Lungs:  normal  respiratory effort and normal breath sounds.  normal respiratory effort, no crackles, and no wheezes.   Heart:  normal rate, regular rhythm, no murmur, no gallop, and no rub.   Abdomen:  soft, non-tender, and normal bowel sounds.   Msk:  normal ROM and no joint instability.   Neurologic:  alert & oriented X3 and strength normal in all extremities.   Skin:  he has 2+ bilateral pretibial edema,  skin thickening in the right thigh has imrpoved non tender to palpation skin on sole of foot is chrnoically hyperpigmented and thickened Psych:  Oriented X3, memory intact for recent and remote, and good eye contact.     Impression & Recommendations:  Problem # 1:  SARCOIDOSIS (ICD-135) Presumptive diagnosis based on elevated ACE level, response of pt to steroids ( he also had bordereline stim test if I recall correctly). WIll refer to [facility] for evaluation and further rx for sarcoid. WIll contineu to follow myself in interim. Drop prednisone to 20mg  Orders: Pulmonary Referral (Pulmonary) Est. Patient Level IV (69629)  Problem # 2:  INGUINAL LYMPHADENOPATHY, RIGHT (ICD-785.6)  minimally enlarged. LN biopsy was unrevealing.   Orders: Est. Patient Level IV (52841)  Problem # 3:  TUBERCULOSIS (ICD-011.90)  was rx in [place] for lymphadenitis and had excisional LN removal in right groin  Orders: Est. Patient Level IV (32440)  Problem # 4:  PLANTAR FASCIITIS, BILATERAL (ICD-728.71)  ? this is what he is getting surgery for  Orders: Est. Patient Level IV (10272)  Medications Added to Medication List This Visit: 1)  Prednisone 20 Mg Tabs (Prednisone) .... Take 1 tablet by mouth once a day  Patient Instructions: 1)  continue the prednisone but drop the dose to 20mg  a day 2)  We need to hvae you followup with a pulmonary MD or other specialistt in sarcoidosis 3)  rtc to see Dr Daiva Eves in 2 months Prescriptions: PREDNISONE 20 MG TABS (PREDNISONE) Take 1 tablet by mouth once a day  #30 x 8   Entered and Authorized by:   Acey Lav MD   Signed by:   Paulette Blanch Dam MD on 11/28/2010   Method used:   Electronically to        [id number]* (retail)       [address] [place], [place]  [address]       Ph: [phone]       Fax: [phone]   RxID:   [id number]

## 2010-12-17 ENCOUNTER — Institutional Professional Consult (permissible substitution): Payer: Self-pay | Admitting: Internal Medicine

## 2010-12-18 ENCOUNTER — Institutional Professional Consult (permissible substitution): Payer: Self-pay | Admitting: Pulmonary Disease

## 2010-12-26 ENCOUNTER — Encounter: Payer: Self-pay | Admitting: Internal Medicine

## 2011-01-02 NOTE — Consult Note (Signed)
Summary: Gastroenterology/[facility]  Gastroenterology[facility]   Imported By: Sherian Rein 12/26/2010 09:38:13  _____________________________________________________________________  External Attachment:    Type:   Image     Comment:   External Document

## 2011-01-08 NOTE — Procedures (Signed)
Summary: [facility]  EGD/[facility]   Imported By: Sherian Rein 01/02/2011 07:25:25  _____________________________________________________________________  External Attachment:    Type:   Image     Comment:   External Document

## 2011-01-08 NOTE — Procedures (Signed)
Summary: [facility]oscopy[facility]  Colonoscopy[facility]   Imported By: Sherian Rein 01/02/2011 07:26:48  _____________________________________________________________________  External Attachment:    Type:   Image     Comment:   External Document

## 2011-01-09 ENCOUNTER — Institutional Professional Consult (permissible substitution): Payer: Self-pay | Admitting: Pulmonary Disease

## 2011-01-17 ENCOUNTER — Institutional Professional Consult (permissible substitution): Payer: Self-pay | Admitting: Pulmonary Disease

## 2011-01-21 ENCOUNTER — Encounter: Payer: Self-pay | Admitting: *Deleted

## 2011-01-22 LAB — DIFFERENTIAL
Lymphs Abs: 2.1 10*3/uL (ref 0.7–4.0)
Monocytes Absolute: 0.5 10*3/uL (ref 0.1–1.0)
Monocytes Relative: 9 % (ref 3–12)
Neutro Abs: 3.4 10*3/uL (ref 1.7–7.7)
Neutrophils Relative %: 54 % (ref 43–77)

## 2011-01-22 LAB — URINE CULTURE
Colony Count: NO GROWTH
Culture  Setup Time: 201111160847
Culture: NO GROWTH

## 2011-01-22 LAB — CBC
HCT: 43 % (ref 39.0–52.0)
MCHC: 35.1 g/dL (ref 30.0–36.0)
Platelets: 186 10*3/uL (ref 150–400)
RDW: 13.4 % (ref 11.5–15.5)
WBC: 6.2 10*3/uL (ref 4.0–10.5)

## 2011-01-22 LAB — COMPREHENSIVE METABOLIC PANEL WITH GFR
ALT: 38 U/L (ref 0–53)
AST: 34 U/L (ref 0–37)
Albumin: 3.9 g/dL (ref 3.5–5.2)
Alkaline Phosphatase: 84 U/L (ref 39–117)
BUN: 8 mg/dL (ref 6–23)
CO2: 26 meq/L (ref 19–32)
Calcium: 9.4 mg/dL (ref 8.4–10.5)
Chloride: 107 meq/L (ref 96–112)
Creatinine, Ser: 0.96 mg/dL (ref 0.4–1.5)
GFR calc Af Amer: >60 mL/min (ref >60)
GFR calc non Af Amer: >60 mL/min (ref >60)
Glucose, Bld: 94 mg/dL (ref 70–99)
Potassium: 4 meq/L (ref 3.5–5.1)
Sodium: 139 meq/L (ref 135–145)
Total Bilirubin: 0.8 mg/dL (ref 0.3–1.2)
Total Protein: 6.9 g/dL (ref 6.0–8.3)

## 2011-01-22 LAB — LIPASE, BLOOD: Lipase: 33 U/L (ref 11–59)

## 2011-01-22 LAB — URINALYSIS, ROUTINE W REFLEX MICROSCOPIC
Glucose, UA: NEGATIVE mg/dL
Hgb urine dipstick: NEGATIVE
Ketones, ur: 15 mg/dL — AB
Protein, ur: NEGATIVE mg/dL
Urobilinogen, UA: 0.2 mg/dL (ref 0.0–1.0)

## 2011-01-23 ENCOUNTER — Ambulatory Visit (INDEPENDENT_AMBULATORY_CARE_PROVIDER_SITE_OTHER): Payer: BC Managed Care – PPO | Admitting: Internal Medicine

## 2011-01-23 ENCOUNTER — Encounter: Payer: Self-pay | Admitting: Internal Medicine

## 2011-01-23 DIAGNOSIS — F411 Generalized anxiety disorder: Secondary | ICD-10-CM

## 2011-01-23 DIAGNOSIS — F450 Somatization disorder: Secondary | ICD-10-CM

## 2011-01-23 DIAGNOSIS — R1013 Epigastric pain: Secondary | ICD-10-CM

## 2011-01-23 DIAGNOSIS — M79609 Pain in unspecified limb: Secondary | ICD-10-CM

## 2011-01-23 LAB — AFB CULTURE WITH SMEAR (NOT AT ARMC): Acid Fast Smear: NONE SEEN

## 2011-01-23 LAB — COMPREHENSIVE METABOLIC PANEL
ALT: 35 U/L (ref 0–53)
Alkaline Phosphatase: 107 U/L (ref 39–117)
CO2: 27 mEq/L (ref 19–32)
Chloride: 103 mEq/L (ref 96–112)
GFR calc non Af Amer: >60 mL/min (ref >60)
Glucose, Bld: 83 mg/dL (ref 70–99)
Potassium: 4.5 mEq/L (ref 3.5–5.1)
Sodium: 136 mEq/L (ref 135–145)
Total Bilirubin: 0.7 mg/dL (ref 0.3–1.2)
Total Protein: 7.6 g/dL (ref 6.0–8.3)

## 2011-01-23 LAB — URINALYSIS, ROUTINE W REFLEX MICROSCOPIC
Glucose, UA: NEGATIVE mg/dL
Hgb urine dipstick: NEGATIVE
pH: 5.5 (ref 5.0–8.0)

## 2011-01-23 LAB — APTT: aPTT: 36 seconds (ref 24–37)

## 2011-01-23 LAB — DIFFERENTIAL
Basophils Absolute: 0.1 10*3/uL (ref 0.0–0.1)
Basophils Relative: 2 % — ABNORMAL HIGH (ref 0–1)
Eosinophils Absolute: 0.2 10*3/uL (ref 0.0–0.7)
Monocytes Relative: 7 % (ref 3–12)
Neutrophils Relative %: 56 % (ref 43–77)

## 2011-01-23 LAB — CBC
HCT: 44 % (ref 39.0–52.0)
HCT: 46 % (ref 39.0–52.0)
Hemoglobin: 15.9 g/dL (ref 13.0–17.0)
MCV: 77.6 fL — ABNORMAL LOW (ref 78.0–100.0)
MCV: 77.6 fL — ABNORMAL LOW (ref 78.0–100.0)
RBC: 5.67 MIL/uL (ref 4.22–5.81)
RBC: 5.93 MIL/uL — ABNORMAL HIGH (ref 4.22–5.81)
RDW: 13.4 % (ref 11.5–15.5)
WBC: 5.9 10*3/uL (ref 4.0–10.5)
WBC: 6.6 10*3/uL (ref 4.0–10.5)

## 2011-01-23 LAB — TISSUE CULTURE: Culture: NO GROWTH

## 2011-01-23 LAB — POCT CARDIAC MARKERS

## 2011-01-27 LAB — PROTIME-INR
INR: 1.06 (ref 0.00–1.49)
Prothrombin Time: 13.7 seconds (ref 11.6–15.2)

## 2011-01-27 LAB — CBC
MCHC: 33.7 g/dL (ref 30.0–36.0)
RDW: 14.2 % (ref 11.5–15.5)

## 2011-01-29 NOTE — Assessment & Plan Note (Signed)
Summary: 2 MTH FU #CD   Vital Signs:  Patient profile:   30 year old male Height:      71 inches Weight:      191 pounds BMI:     26.74 Temp:     98.6 degrees F oral Pulse rate:   76 / minute Pulse rhythm:   regular Resp:     16 per minute BP sitting:   110 / 80  (left arm) Cuff size:   regular  Vitals Entered By: Lanier Prude, CMA(AAMA) (January 23, 2011 4:20 PM) CC: 2 mo f/u Is Patient Diabetic? No Comments pt is n ot taking Vit D, Amitiza, zofran,tramadol, buspirone, miralax or Prednisone   Primary Care Provider:  Tresa Garter MD  CC:  2 mo f/u.  History of Present Illness: F/u abd pain - better C/o insomnia F/u constipation C/o ED  Current Medications (verified): 1)  Vitamin D 1000 Unit Tabs (Cholecalciferol) .Marland Kitchen.. 1 By Mouth Qd 2)  Amitiza 24 Mcg Caps (Lubiprostone) .Marland Kitchen.. 1 By Mouth Once Daily As Needed Constipation 3)  Zofran Odt 4 Mg Tbdp (Ondansetron) .... Take Every Six Hours As Needed 4)  Tramadol Hcl 50 Mg Tabs (Tramadol Hcl) .... Take 1-2 Tablets Every 6 Hours As Needed For Pain 5)  Buspirone Hcl 15 Mg Tabs (Buspirone Hcl) .Marland Kitchen.. 1 By Mouth Two Times A Day For Anxiety 6)  Miralax  Powd (Polyethylene Glycol 3350) .... Take 1 Capful (17 Grams) Dissolved in At Least 8 Ounces of Water/juice and Drink Twice Daily. May Titrate Dose As Needed 7)  Aciphex 20 Mg Tbec (Rabeprazole Sodium) .Marland Kitchen.. 1 By Mouth 8)  Prednisone 20 Mg Tabs (Prednisone) .... Take 1 Tablet By Mouth Once A Day 9)  Sudafed 12 Hour 120 Mg Xr12h-Tab (Pseudoephedrine Hcl) .Marland Kitchen.. 1 By Mouth Two Times A Day As Needed Allergies  Allergies (verified): 1)  ! 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 Alcohol use-no Drug use-no Regular exercise-yes  Past Medical History: Hx of TINEA PEDIS (ICD-110.4) GASTROESOPHAGEAL REFLUX DISEASE (ICD-530.81) PLANTAR FASCIITIS, BILATERAL (ICD-728.71) Tuberculosis 1998 [place], rx  again in 2001(of note he had evdence of calcified lymph nodes in abdomen as well as some duodenal inflammation)  Hep B SAb/CAb+ DUDODENITIS Lymphedema in the right leg IBS Psychosomatic disorder Anxiety Headaches  ED  Physical Exam  General:  Non-toxic, alert, well-developed, well-nourished, well-hydrated, appropriate dress, looks tired and slow, cooperative to examination, and with good hygiene.   Nose:  External nasal examination shows bloody d/c Mouth:  Erythematous throat and intranasal mucosa c/w URI  Lungs:  Normal respiratory effort, chest expands symmetrically. Lungs are clear to auscultation, no crackles or wheezes. Heart:  Normal rate and regular rhythm. S1 and S2 normal without gallop, murmur, click, rub or other extra sounds. Abdomen:  soft, sensitive, normal bowel sounds, no distention, no masses, no guarding, no rigidity, no rebound tenderness, no abdominal hernia, no inguinal hernia, no hepatomegaly, and no splenomegaly.   Msk:  No deformity or scoliosis noted of thoracic or lumbar spine.   Pulses:  R and L carotid,radial,femoral,dorsalis pedis and posterior tibial pulses are full and equal bilaterally Extremities:  No clubbing, cyanosis, edema, or deformity noted with normal full range of motion of all joints.   Neurologic:  No cranial nerve deficits noted. Station and gait are normal. Plantar reflexes are down-going bilaterally. DTRs are symmetrical throughout. Sensory, motor and coordinative functions appear intact. Skin:  turgor normal,  color normal, no rashes, no suspicious lesions, no ecchymoses, no petechiae, and no purpura.   Cervical Nodes:  No lymphadenopathy noted Psych:  Oriented X3, memory intact for recent and remote, normally interactive, good eye contact, not anxious appearing, not depressed appearing, not agitated, not suicidal, and not homicidal.     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 1 by mouth once daily prn  Complete Medication List: 1)  Amitiza 24 Mcg Caps (Lubiprostone) .Marland Kitchen.. 1 by mouth once daily as needed constipation 2)  Zofran Odt 4 Mg Tbdp (Ondansetron) .... Take every six hours as needed 3)  Buspirone Hcl 15 Mg Tabs (Buspirone hcl) .Marland Kitchen.. 1 by mouth two times a day for anxiety 4)  Miralax Powd (Polyethylene glycol 3350) .... Take 1 capful (17 grams) dissolved in at least 8 ounces of water/juice and drink twice daily. may titrate dose as needed 5)  Aciphex 20 Mg Tbec (Rabeprazole sodium) .Marland Kitchen.. 1 by mouth 6)  Prednisone 20 Mg Tabs (Prednisone) .... Take 1 tablet by mouth once a day 7)  Sudafed 12 Hour 120 Mg Xr12h-tab (Pseudoephedrine hcl) .Marland Kitchen.. 1 by mouth two times a day as needed allergies 8)  Vitamin D 1000 Unit Tabs (Cholecalciferol) .Marland Kitchen.. 1 by mouth qd 9)  Nortriptyline Hcl 10 Mg Caps (Nortriptyline hcl) .Marland Kitchen.. 1 by mouth at bedtime for sleep 10)  Staxyn 10 Mg Tbdp (Vardenafil hcl) .Marland Kitchen.. 1 by mouth once daily prn  Patient Instructions: 1)  Bring all your medicines next time you come 2)  Please schedule a follow-up appointment in 3 months. Prescriptions: STAXYN 10 MG TBDP (VARDENAFIL HCL) 1 by mouth once daily prn  #12 x 12   Entered and Authorized by:   Tresa Garter MD   Signed by:   Tresa Garter MD on 01/23/2011   Method used:   Print then Give to Patient   RxID:   [id number] NORTRIPTYLINE HCL 10 MG CAPS (NORTRIPTYLINE HCL) 1 by mouth at bedtime for sleep  #60 x 6   Entered  and Authorized by:   Tresa Garter MD   Signed by:   Tresa Garter MD on 01/23/2011   Method used:   Electronically to        [facility] [address] #[id number]* (retail)       [address] [place], [place]  [address]       Ph: [phone]       Fax: [phone]   RxID:   [id number]    Orders Added: 1)  Est. Patient Level IV [56387]

## 2011-01-30 ENCOUNTER — Encounter: Payer: Self-pay | Admitting: *Deleted

## 2011-02-07 ENCOUNTER — Ambulatory Visit: Payer: Self-pay | Admitting: Internal Medicine

## 2011-03-26 NOTE — Assessment & Plan Note (Signed)
[facility]                           PRIMARY CARE OFFICE NOTE   Chad Spears, Chad Spears                          MRN:          [id number]  DATE:05/22/2007                            DOB:          Aug 18, 1981    REASON FOR VISIT:  The patient is a 30 year old male, new patient (not  seen in over three years) who presents to re-establish with me.  Complains both feet hurt at times.  This has been going on for three  months.  He also has a rash on the feet which was itching and problems  with indigestion at night on occasion.   ALLERGIES:  None.   MEDICATIONS:  None.   PAST MEDICAL HISTORY/FAMILY AND SOCIAL HISTORY:  As per November 17, 2002  notes.   REVIEW OF SYSTEMS:  As above.  The swelling on the right side resolved  quite nicely and has not been bothering the patient.  The rest of the 18  point review of systems is otherwise negative.   PHYSICAL EXAMINATION:  VITAL SIGNS:  Blood pressure 126/72, pulse 74,  temperature 98.6.  GENERAL APPEARANCE:  He is in no acute distress.  He looks well.  HEENT:  Moist mucosa.  NECK:  Supple.  No thyromegaly or bruits.  LUNGS:  Clear.  No wheezes or rales.  CARDIOVASCULAR:  Heart S1, S2, no murmurs.  No gallops.  ABDOMEN:  Soft, nontender, no organomegaly or masses felt.  EXTREMITIES:  Lower extremities without edema.  Both heels tender to  palpation.  Scaly rash on both feet.  NEUROLOGICAL:  He is alert and cooperative and denies being depressed.   ASSESSMENT/PLAN:  1. Bilateral plantar fasciitis.  Advised to use ice, stretching, new      shoes.  Naproxen 500 mg p.o. b.i.d. for 10 days.  2. Rash on feet.  Tinea pedis.  Ketoconazole 2% cream to use b.i.d.      for one month.  3. Gastroesophageal reflux disease.  Prescribed ranitidine 300 mg at      night.  4. I will see him back in a couple of months.     Georgina Quint. Plotnikov, MD  Electronically Signed    AVP/MedQ  DD: 06/01/2007  DT: 06/01/2007  Job #:  [id number]

## 2011-03-29 NOTE — Discharge Summary (Signed)
[place]. [facility]  Patient:    Chad Spears, Chad Spears                          MRN: [id number] Adm. Date:  [id number] Disc. Date: [id number] Attending:  Judie Petit Dictator:   Cornell Barman, P.A. CC:         Valetta Mole. Swords, M.D. [facility]                           Discharge Summary  DISCHARGE DIAGNOSES: 1. Right lower extremity pain. 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  BRIEF HISTORY:  The patient is a 30 year old [place] refugee immigrated to the [place] one month ago. He presents with pain in his right legs for several weeks. He has seen Bruce H. Swords, M.D. in the office and had a CT scan that revealed adenopathy. Biopsy was recommended and the patient was admitted for the biopsy.  HOSPITAL COURSE:  1. RIGHT LOWER EXTREMITY PAIN:  The patient was empirically started on Ancef and workup for adenopathy was pursued. The patients PPD was positive. Therefore, calcified iliac nodes were thought to be secondary to old TB. Therefore, biopsy was not pursued. Infectious disease was asked to see the patient. They agree that the patient probably had tuberculosis adenitis that was old. He apparently had right foot and groin surgery in [place] in 1998, and received six months of medical therapy for TB in 1998. The patient does have residual positive PPD and chronic right lower extremity swelling. Dr. Orvan Falconer saw the patient and recommended discontinuing his Ancef and respiratory isolation. He felt that the patient might benefit from compressive stockings of the right leg. According to Dr. Orvan Falconer, the patient is afebrile and has gained weight and has a sed rate of 1 without acute inflammation in his right leg. He felt that the patient probably had been bothered by right lower extremity swelling that may have recently been worse, but did not think that the patient had active TB. He felt that the patient had residual scarred and calcified nodes causing chronic lymphedema.  If further investigation showed that the patients process was more active, then he would need a diagnostic biopsy at that time. This diagnostic biopsy would confirm TB and check susceptibility for treatment. At this time, he did not favor biopsy and did not feel he needed isolation or therapy. His Doppler ultrasound and CT were negative for DVT. The patient was then felt to be stable for discharge home.  FOLLOW-UP INSTRUCTIONS:  We will arrange followup with the patient to see Bruce H. Swords, M.D.  in one to two weeks. DD:  04/22/00 TD:  04/24/00 Job: [id number]

## 2011-03-29 NOTE — H&P (Signed)
. [facility]  Patient:    Chad Spears, Chad Spears                          MRN: [id number] Adm. Date:  [id number] Attending:  Judie Petit CC:         Valetta Mole. Swords, M.D. [facility] [place] [facility]                         History and Physical  DATE OF BIRTH:  24-Jan-1981.  CHIEF COMPLAINT:  Pain in the right leg of two to four weeks duration.  HISTORY OF PRESENT ILLNESS:  The patient is a 30 year old [place] refugee male with the above symptoms of unknown period of time; apparently, it has been two weeks or longer.  He saw Dr. Valetta Mole. Swords in the office and a CT scan of his leg was obtained.  Right groin adenopathy was noted and the biopsy recommended.  The patient was admitted to [facility].  The history is limited due to language barrier.  SOCIAL HISTORY:  He is a [place] refugee.  He arrived in the [place] five weeks ago.  PAST MEDICAL HISTORY:  Unknown.  PAST SURGICAL HISTORY:  In 1999, he had two surgeries on the right leg in [place] or [place] due to foreign body in the foot and secondary infection.  FAMILY HISTORY:  Unknown.  REVIEW OF SYSTEMS:  No fever.  Sweats.  Pain with progressive increase. Otherwise, unobtainable.  PHYSICAL EXAMINATION:  VITAL SIGNS:  O2 saturation initially was 89% on room air; recheck 100%. Temperature 97.2, respirations 18, pulse 72.  GENERAL:  He is in no acute distress, sweating during exam.  He is alert and cooperative.  HEENT:  Moist mucosa.  SKIN:  Skin on the forehead and scalp with symmetric scars.  There are post surgical scars in the right foot and in the right groin with keloid formation.  EXTREMITIES:  The right leg is bigger.  There is a firm mass in the right groin.  HEART:  Regular.  LUNGS:  Clear to auscultation and percussion.  ABDOMEN:  Soft and nontender.  No organomegaly.  No masses felt.  LYMPHATICS:  No other lymphadenopathy.  NECK:  Supple.  No  thyromegaly.  NEUROLOGIC:  There are no meningeal signs.  ASSESSMENT AND PLAN: 1. Right leg pain of unclear etiology.  Prescribed Vicodin.  Obtain CT scan    results.  Obtain Doppler and ultrasound. 2. Right groin lymphadenopathy; biopsy pending on Monday.  Surgical consult    obtained. 3. Sweating due to pain.  Tylenol p.r.n. 4. Dr. Huntley Estelle help is very appreciated. DD:  04/18/00 TD:  04/19/00 Job: [id number]

## 2011-04-24 ENCOUNTER — Encounter: Payer: Self-pay | Admitting: *Deleted

## 2011-04-25 ENCOUNTER — Ambulatory Visit: Payer: BC Managed Care – PPO | Admitting: Internal Medicine

## 2011-04-25 IMAGING — CT CT ABD-PELV W/ CM
2 of 4 series · 17 of 46 positions shown, 19 images · IV contrast (100ml omni 300)
Comparison: CT abdomen pelvis of 03/09/2010

CLINICAL DATA: Abdominal and low back pain

CT ABDOMEN AND PELVIS WITH CONTRAST
TECHNIQUE: Multidetector CT imaging of the abdomen and pelvis was
performed following the standard protocol during bolus
administration of intravenous contrast.
Contrast: 100 ml Wmnipaque-H55.  This patient did receive the 13
our preparation due to prior contrast reaction.  However is still
vomited during the infusion of contrast which resulted in somewhat
suboptimal exam due to delayed imaging.

[Series 2: routine abdomen · axial · 0.75mm/px · z∈[-630,-50]mm · 14 of 106 slices shown, 16 images]
[im 5/106  soft-tissue]
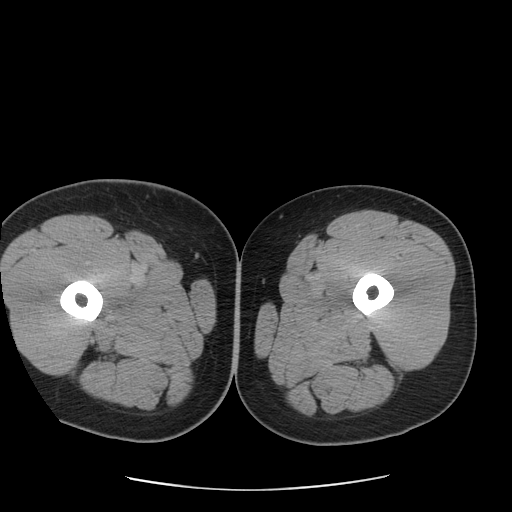
[im 5/106  bone]
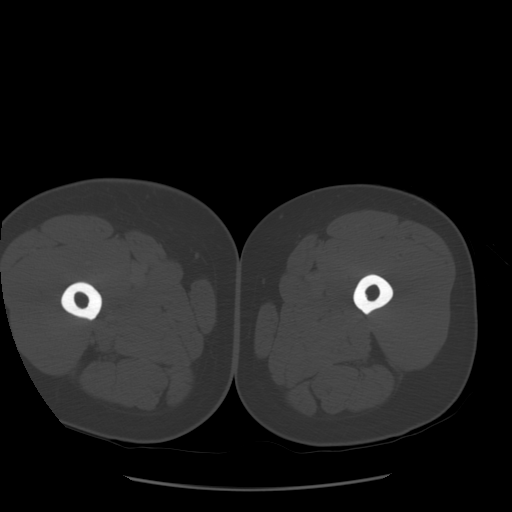
[im 14/106  soft-tissue]
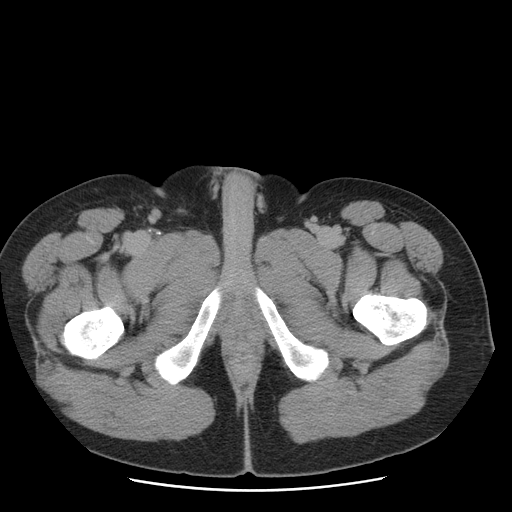
[im 22/106  soft-tissue]
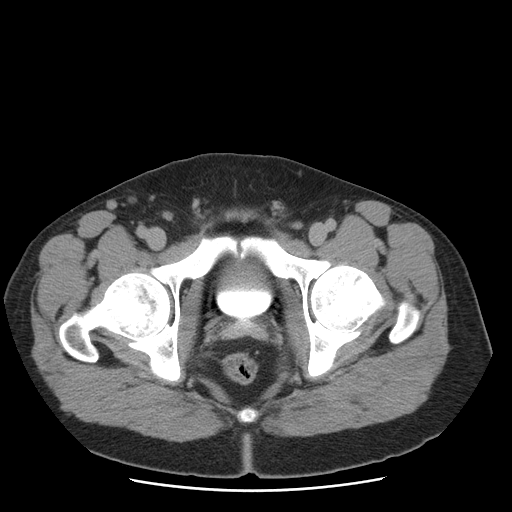
[im 27/106  soft-tissue]
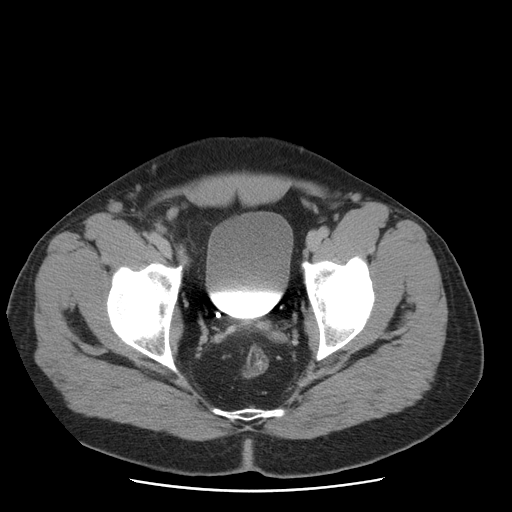
[im 36/106  soft-tissue]
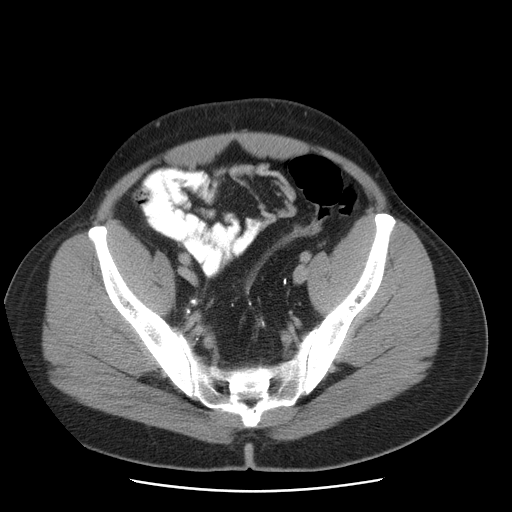
[im 44/106  soft-tissue]
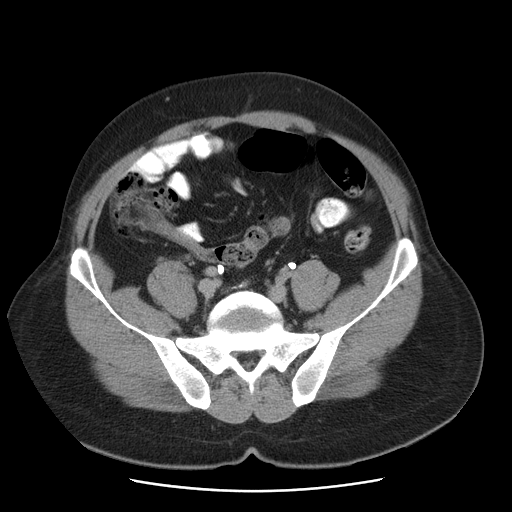
[im 49/106  soft-tissue]
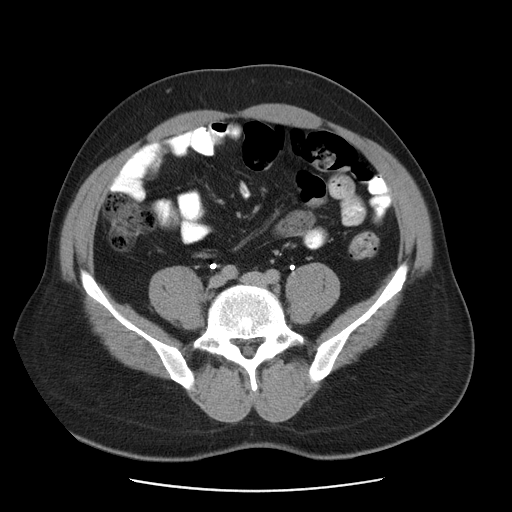
[im 57/106  soft-tissue]
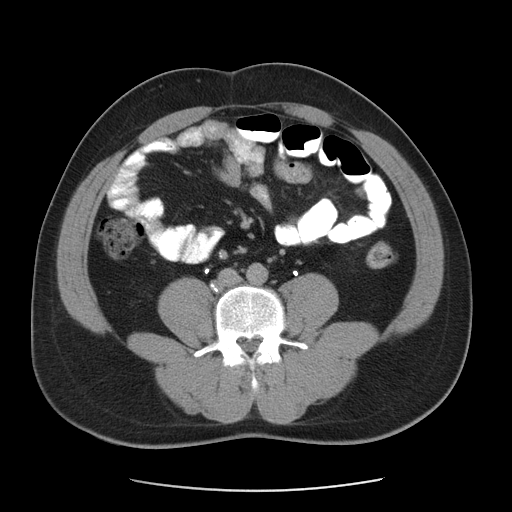
[im 62/106  soft-tissue]
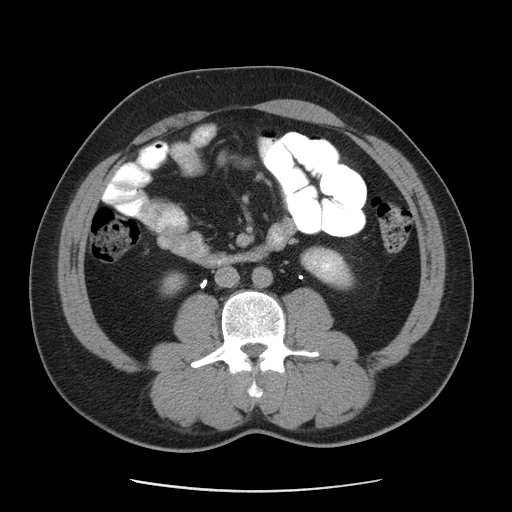
[im 62/106  bone]
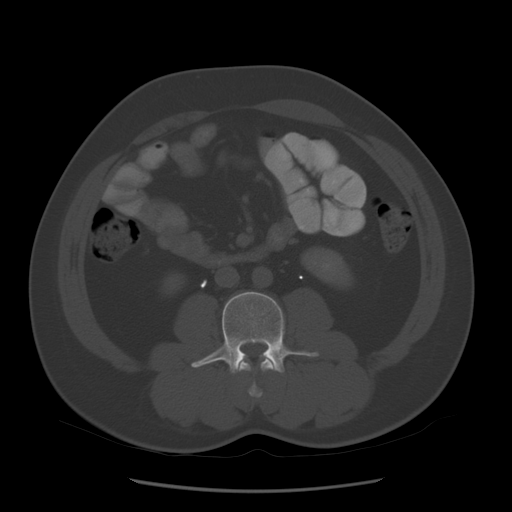
[im 71/106  soft-tissue]
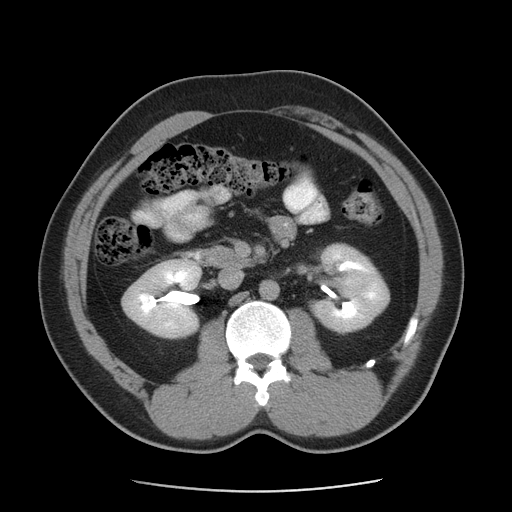
[im 79/106  soft-tissue]
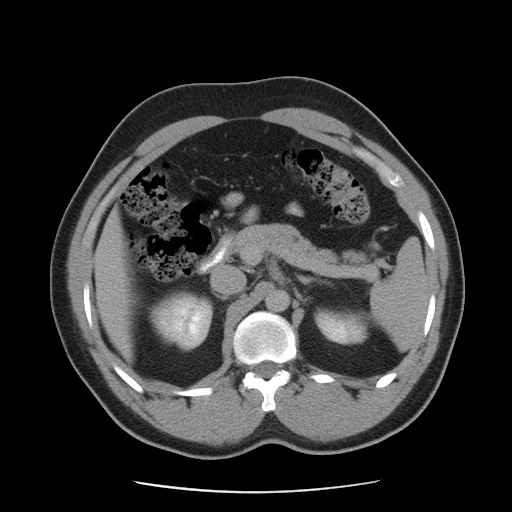
[im 84/106  soft-tissue]
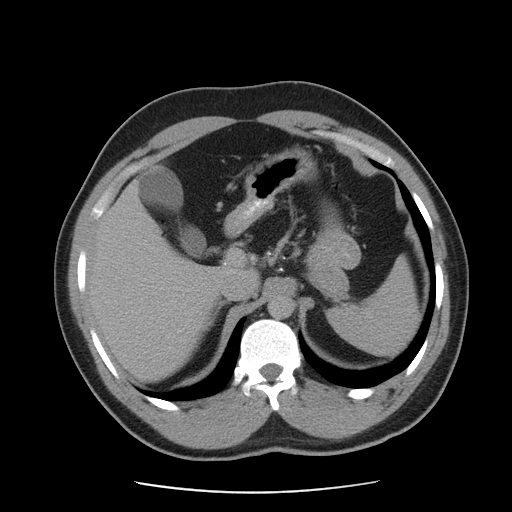
[im 92/106  soft-tissue]
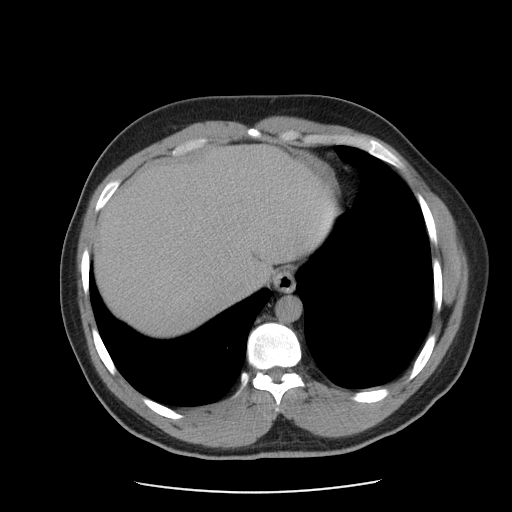
[im 101/106  soft-tissue]
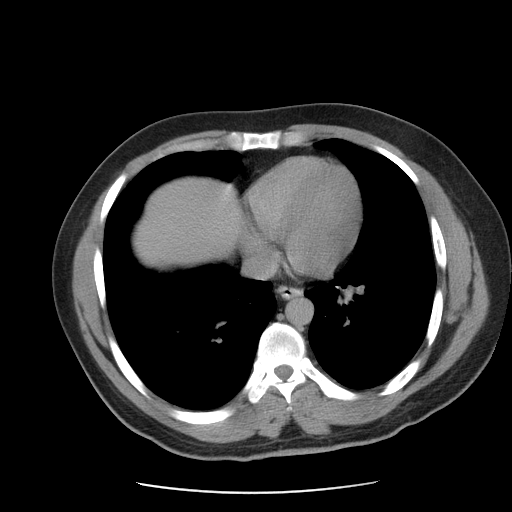

[Series 401: cor · coronal · 1.02mm/px · 3 of 110 slices shown]
[im 37/110  soft-tissue]
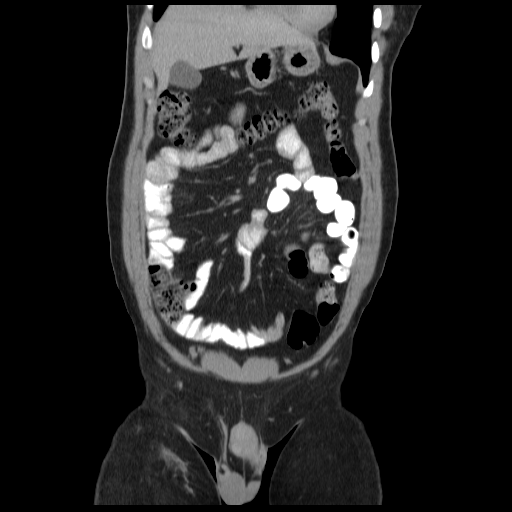
[im 49/110  soft-tissue]
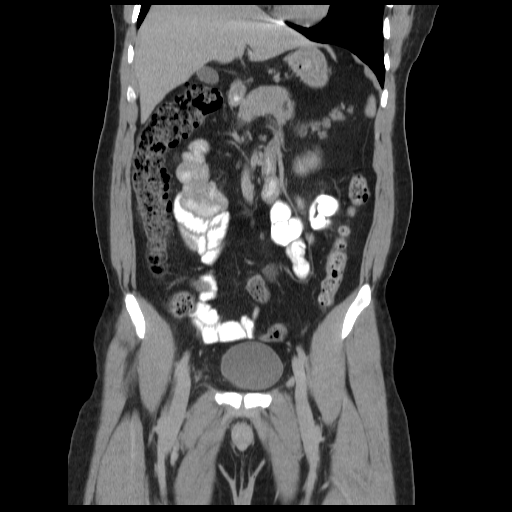
[im 61/110  soft-tissue]
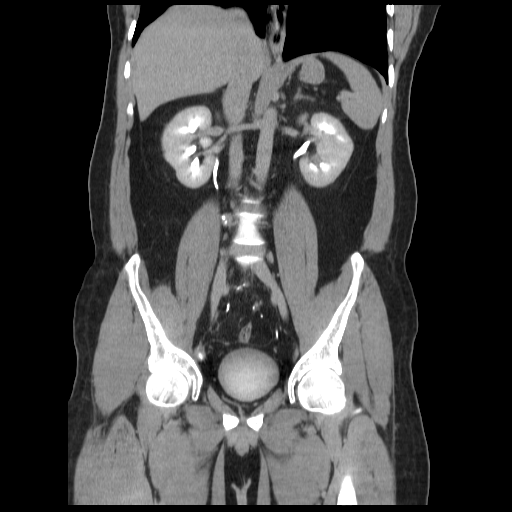

[17 of 46 positions shown; findings below may reference images not displayed]

FINDINGS: The lung bases are clear.  The liver enhances on a
delayed basis due to the patient's regurgitation during the
contrast administration with no focal abnormality. No calcified
gallstones are seen.  The pancreas is normal in size and the
pancreatic duct is not dilated.  The adrenal glands and spleen are
stable.  The stomach is not distended.  The delayed images through
the kidneys show normal pelvocaliceal systems with no mass or
hydronephrosis.  The abdominal aorta is normal in caliber.  No
adenopathy is seen.

The urinary bladder is unremarkable with some contrast layering
posteriorly on the delayed images obtained.  The prostate is normal
in size.  The appendix is visualized in the right lower quadrant
and appears normal.  The terminal ileum is unremarkable.  No fluid
is seen within the pelvis.  No bony abnormality is noted.  A few
small nodes are present in the right lower quadrant, none of which
are enlarged.
IMPRESSION: 1.  No significant abnormality on CT of the abdomen and pelvis.  No
acute process.
2.  The appendix and terminal ileum appear normal.

## 2011-05-16 ENCOUNTER — Ambulatory Visit: Payer: BC Managed Care – PPO | Attending: Podiatry

## 2011-05-16 DIAGNOSIS — M25579 Pain in unspecified ankle and joints of unspecified foot: Secondary | ICD-10-CM | POA: Insufficient documentation

## 2011-05-16 DIAGNOSIS — M6281 Muscle weakness (generalized): Secondary | ICD-10-CM | POA: Insufficient documentation

## 2011-05-16 DIAGNOSIS — IMO0001 Reserved for inherently not codable concepts without codable children: Secondary | ICD-10-CM | POA: Insufficient documentation

## 2011-05-16 DIAGNOSIS — M25676 Stiffness of unspecified foot, not elsewhere classified: Secondary | ICD-10-CM | POA: Insufficient documentation

## 2011-05-16 DIAGNOSIS — M25673 Stiffness of unspecified ankle, not elsewhere classified: Secondary | ICD-10-CM | POA: Insufficient documentation

## 2011-05-17 ENCOUNTER — Encounter: Payer: BC Managed Care – PPO | Admitting: Physical Therapy

## 2011-05-23 ENCOUNTER — Encounter: Payer: BC Managed Care – PPO | Admitting: Physical Therapy

## 2011-06-10 ENCOUNTER — Encounter (INDEPENDENT_AMBULATORY_CARE_PROVIDER_SITE_OTHER): Payer: Self-pay | Admitting: Surgery

## 2011-06-12 ENCOUNTER — Ambulatory Visit (INDEPENDENT_AMBULATORY_CARE_PROVIDER_SITE_OTHER): Payer: BC Managed Care – PPO | Admitting: Surgery

## 2011-06-12 ENCOUNTER — Encounter (INDEPENDENT_AMBULATORY_CARE_PROVIDER_SITE_OTHER): Payer: Self-pay | Admitting: Surgery

## 2011-06-12 VITALS — BP 119/81 | HR 69 | Temp 97.3°F | Ht 71.0 in | Wt 183.4 lb

## 2011-06-12 DIAGNOSIS — R221 Localized swelling, mass and lump, neck: Secondary | ICD-10-CM | POA: Insufficient documentation

## 2011-06-12 DIAGNOSIS — R220 Localized swelling, mass and lump, head: Secondary | ICD-10-CM

## 2011-06-12 NOTE — Progress Notes (Signed)
Chad Spears is a 30 y.o. male.    Chief Complaint  Patient presents with  . Lipoma    HPI HPI This is a 30 year old gentleman referred to me for a mass on his neck at the posterior area of the back. He reports it is only recently been there after trauma. He reports it is causing Chad Spears discomfort. He was hit by a piece of metal on the back of his neck at work per his report. The pain is moderate in intensity.  Past Medical History  Diagnosis Date  . Dermatophytosis of foot   . Esophageal reflux   . Plantar fascial fibromatosis   . TB (tuberculosis) 1998    [place], rx again in 2001 (of note he had evidence fo calcified lymph nodes in abdomen as well as some duodenal inflammation)  . Hepatitis     B- SAB/ CAB +  . Duodenitis   . Lymphedema     in right leg  . IBS (irritable bowel syndrome)   . Psychosomatic disorder   . Anxiety   . Headache   . ED (erectile dysfunction)     Past Surgical History  Procedure Date  . Right foot surgery     completed in [place]  . Right groin     ? TB infection surgery- remote completed in [place]  . Tonsillectomy     History reviewed. No pertinent family history.  Social History History  Substance Use Topics  . Smoking status: Never Smoker   . Smokeless tobacco: Not on file  . Alcohol Use: No    Allergies  Allergen Reactions  . Iohexol      Code: HIVES, Desc: PER SYSTEM NOTES/PCC PT IS ALLERGIC TO IV DYE 09/28/10/RM, Onset Date: [id number]     Current Outpatient Prescriptions  Medication Sig Dispense Refill  . busPIRone (BUSPAR) 15 MG tablet Take 15 mg by mouth 2 (two) times daily.        . cholecalciferol (VITAMIN D) 1000 UNITS tablet Take 1,000 Units by mouth daily.        Marland Kitchen lubiprostone (AMITIZA) 24 MCG capsule Take 24 mcg by mouth daily as needed.        . ondansetron (ZOFRAN ODT) 4 MG disintegrating tablet Take 4 mg by mouth every 6 (six) hours as needed.        . polyethylene glycol (MIRALAX) powder Take 1 capful (17 grams)  dissolved in at least 8 ounces of water/juice and drink twice daily. May titrate dose as needed.       . predniSONE (DELTASONE) 20 MG tablet Take 20 mg by mouth daily.        . pseudoephedrine (SUDAFED) 120 MG 12 hr tablet Take 120 mg by mouth 2 (two) times daily as needed.        . RABEprazole (ACIPHEX) 20 MG tablet Take 20 mg by mouth daily.        . traMADol (ULTRAM) 50 MG tablet Take 1-2 tabs every 6 hours as needed for pain.         Review of Systems Review of Systems  Constitutional: Negative.   Eyes: Negative.   Respiratory: Negative.   Cardiovascular: Negative.   Gastrointestinal: Negative.   Genitourinary: Negative.   Musculoskeletal: Negative.   Skin: Negative.   Neurological: Negative.   Endo/Heme/Allergies: Negative.   Psychiatric/Behavioral: Negative.     Physical Exam Physical Exam  Constitutional: He appears well-developed and well-nourished. No distress.  HENT:  Head: Normocephalic and atraumatic.  Right  Ear: External ear normal.  Left Ear: External ear normal.  Nose: Nose normal.  Mouth/Throat: Oropharynx is clear and moist. No oropharyngeal exudate.  Eyes: Conjunctivae and EOM are normal. Pupils are equal, round, and reactive to light. No scleral icterus.  Neck: Normal range of motion. Neck supple. No thyromegaly present.       5 cm mass at base of neck posterior.  Soft, mobile.  I inserted a needle into the mass and found no fluid  Cardiovascular: Normal rate, regular rhythm, normal heart sounds and intact distal pulses.   No murmur heard. Respiratory: Effort normal and breath sounds normal. No respiratory distress. He has no wheezes.  GI: Bowel sounds are normal. There is no tenderness.  Musculoskeletal: Normal range of motion. He exhibits no edema and no tenderness.  Lymphadenopathy:    He has no cervical adenopathy.  Skin: Skin is warm and dry. No rash noted. No erythema.     Blood pressure 119/81, pulse 69, temperature 97.3 F (36.3 C), temperature  source Temporal, height 5\' 11"  (1.803 m), weight 183 lb 6.4 oz (83.19 kg).  Assessment/Plan This is a 30 year old gentleman with a 5 cm posterior neck mass at the base of uncertain etiology. I suspect this is all lipoma and unrelated to his trauma based on his physical examination. Regardless, excision of this was recommended for histologic evaluation to rule out a malignancy given his symptoms. I discussed the risk of surgery with Chad Spears which included but is not limited to bleeding, infection, need for further surgery, recurrence, etc. he understands and wishes to proceed.  Celsey Asselin A 06/12/2011, 3:13 PM

## 2011-06-20 ENCOUNTER — Encounter (HOSPITAL_COMMUNITY)
Admission: RE | Admit: 2011-06-20 | Discharge: 2011-06-20 | Disposition: A | Discharge status: Home / Self Care | Payer: BC Managed Care – PPO | Source: Ambulatory Visit | Attending: Surgery | Admitting: Surgery

## 2011-06-20 LAB — CBC
MCH: 27.6 pg (ref 26.0–34.0)
MCV: 78.8 fL (ref 78.0–100.0)
Platelets: 278 10*3/uL (ref 150–400)
RDW: 13.4 % (ref 11.5–15.5)

## 2011-06-21 ENCOUNTER — Ambulatory Visit (HOSPITAL_COMMUNITY): Admission: RE | Admit: 2011-06-21 | Payer: BC Managed Care – PPO | Source: Ambulatory Visit | Admitting: Surgery

## 2011-06-28 ENCOUNTER — Ambulatory Visit (HOSPITAL_COMMUNITY)
Admission: RE | Admit: 2011-06-28 | Discharge: 2011-06-28 | Disposition: A | Discharge status: Home / Self Care | Payer: BC Managed Care – PPO | Source: Ambulatory Visit | Attending: Surgery | Admitting: Surgery

## 2011-06-28 ENCOUNTER — Other Ambulatory Visit (INDEPENDENT_AMBULATORY_CARE_PROVIDER_SITE_OTHER): Payer: Self-pay | Admitting: Surgery

## 2011-06-28 DIAGNOSIS — R220 Localized swelling, mass and lump, head: Secondary | ICD-10-CM | POA: Insufficient documentation

## 2011-06-28 DIAGNOSIS — Z01812 Encounter for preprocedural laboratory examination: Secondary | ICD-10-CM | POA: Insufficient documentation

## 2011-06-28 DIAGNOSIS — D1739 Benign lipomatous neoplasm of skin and subcutaneous tissue of other sites: Secondary | ICD-10-CM

## 2011-06-30 NOTE — Op Note (Signed)
  NAME:  RADFORD, PEASE NO.:  [id number]  MEDICAL RECORD NO.:  [id number]  LOCATION:  [facility]                         FACILITY:  [facility]  PHYSICIAN:  Abigail Miyamoto, M.D. DATE OF BIRTH:  08-14-1981  DATE OF PROCEDURE:  06/28/2011 DATE OF DISCHARGE:                              OPERATIVE REPORT   PREOPERATIVE DIAGNOSIS:  5-cm posterior neck mass.  POSTOPERATIVE DIAGNOSIS:  5-cm posterior neck mass.  PROCEDURE:  Excision of 5-cm subcutaneous posterior neck mass.  SURGEON:  Abigail Miyamoto, MD  ANESTHESIA:  1% lidocaine, 0.5% Marcaine, and monitored anesthesia care.  ESTIMATED BLOOD LOSS:  Minimal.  FINDINGS:  The patient was found to have a mass that appeared consistent with a subcutaneous lipoma.  It was sent to Pathology for evaluation.  PROCEDURE IN DETAIL:  The patient was brought to the operating room and identified as Parke Simmers.  He was placed supine on the operating room table and anesthesia was induced.  The patient was placed in the left lateral decubitus position.  His posterior neck and back were then prepped and draped in the usual sterile fashion.  The palpable mass appeared subcutaneous and was at the base of the neck along the back.  I anesthetized the skin with 1% lidocaine and made a longitudinal incision with a scalpel.  I took this down to subcutaneous tissue with electrocautery.  The mass appeared consistent with lipoma.  I excised it in its entirety with the electrocautery.  Again, it was subcutaneous. Once the mass was removed, it was sent to Pathology for evaluation.  I then achieved hemostasis with cautery.  I injected the wound further with 0.5% Marcaine.  I then closed the subcutaneous tissue with interrupted 3-0 Vicryl sutures and closed the skin with interrupted 3-0 nylon sutures.  Gauze and tape were then applied.  The patient tolerated the procedure well.  All counts were correct at the end of the procedure.  The patient was  then extubated in the operating room and taken in stable condition to the recovery room.     Abigail Miyamoto, M.D.     DB/MEDQ  D:  06/28/2011  T:  06/28/2011  Job:  [id number]  Electronically Signed by Abigail Miyamoto M.D. on 06/30/2011 05:40:47 PM

## 2011-07-05 IMAGING — CT CT HEAD W/O CM
1 series · 16 of 30 positions shown, 20 images · non-contrast
Comparison: None.

CLINICAL DATA: 2 weeks persistent headache and abdominal pain.
Contrast allergy.

CT HEAD WITHOUT CONTRAST
TECHNIQUE: Contiguous axial images were obtained from the base of
the skull through the vertex without contrast.

[Series 2: headseq 4.8 h45s · axial · 0.43mm/px · z∈[-116,+36]mm · 16 of 36 slices shown, 20 images]
[im 2/36  brain]
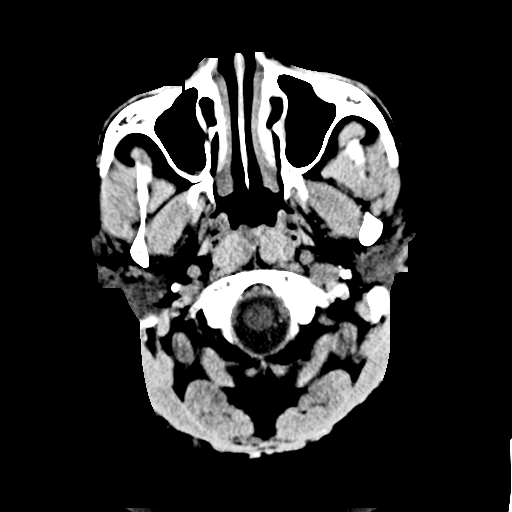
[im 2/36  bone]
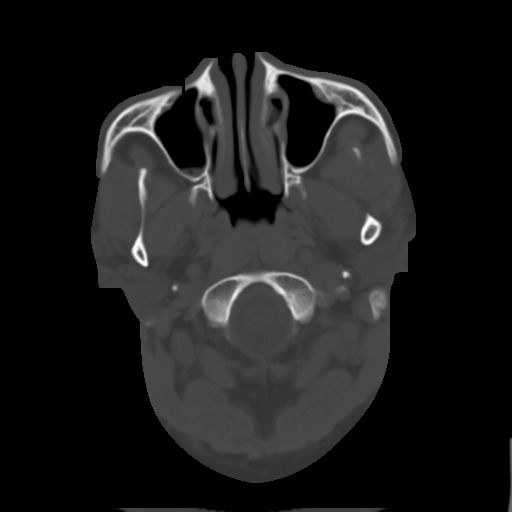
[im 4/36  brain]
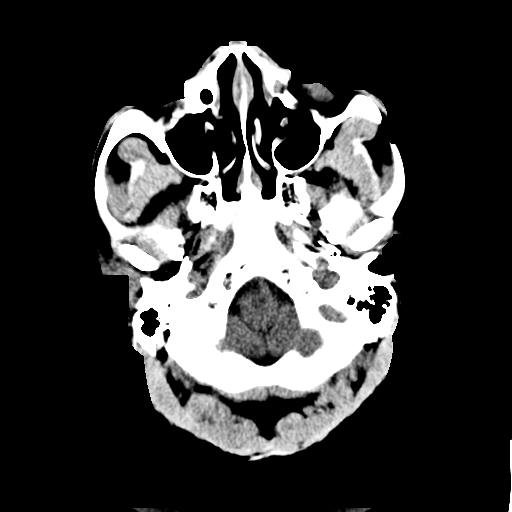
[im 7/36  brain]
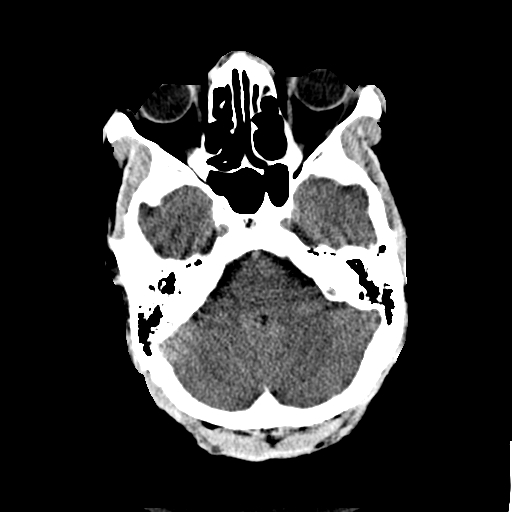
[im 9/36  brain]
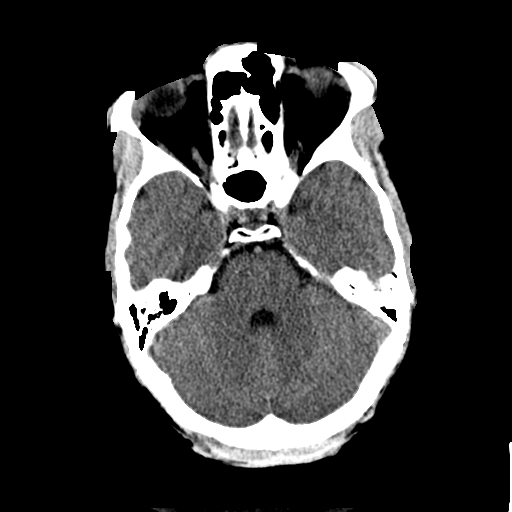
[im 10/36  brain]
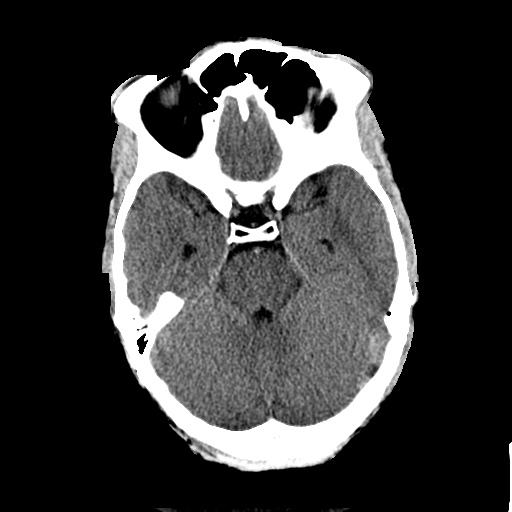
[im 10/36  bone]
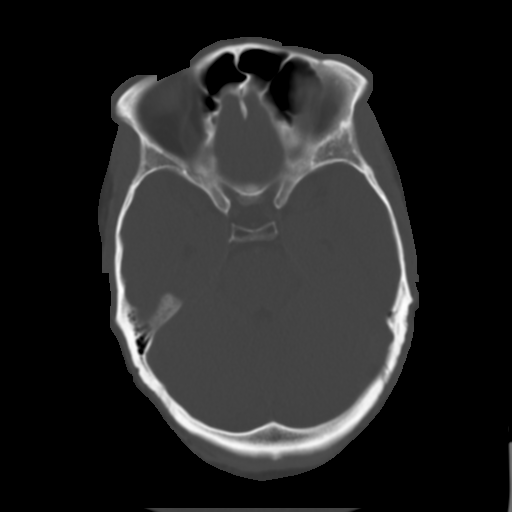
[im 13/36  brain]
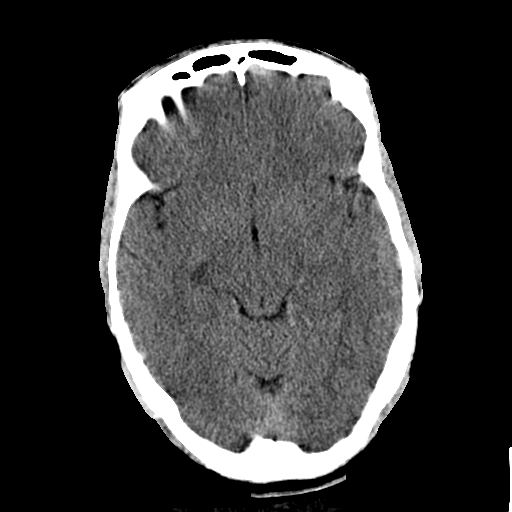
[im 15/36  brain]
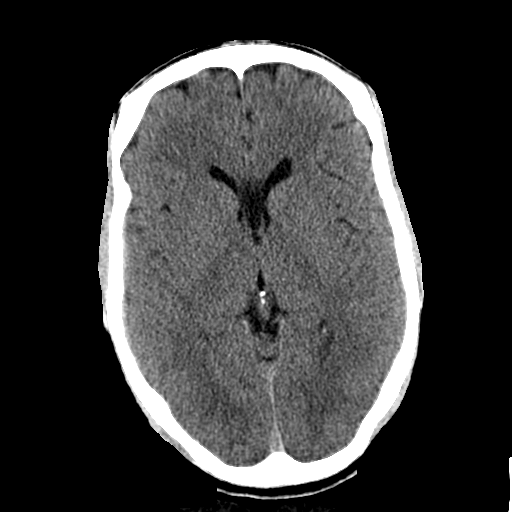
[im 17/36  brain]
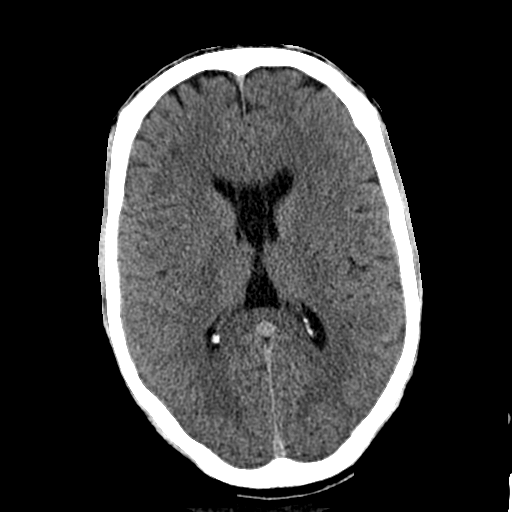
[im 19/36  brain]
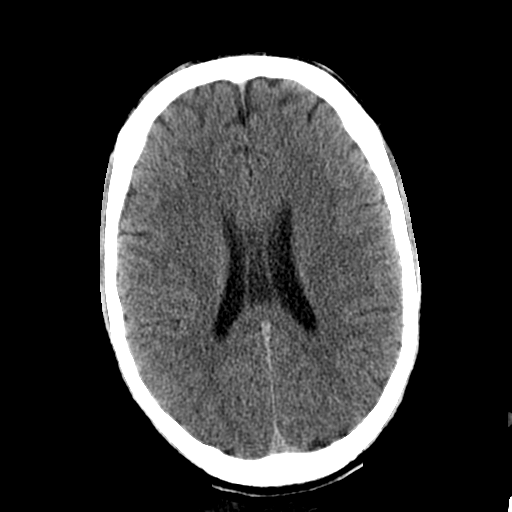
[im 19/36  bone]
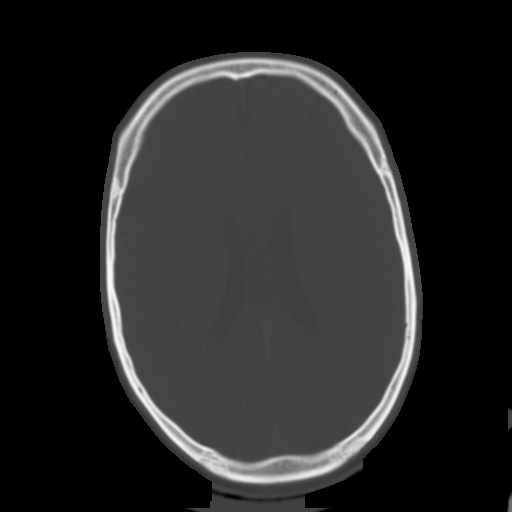
[im 21/36  brain]
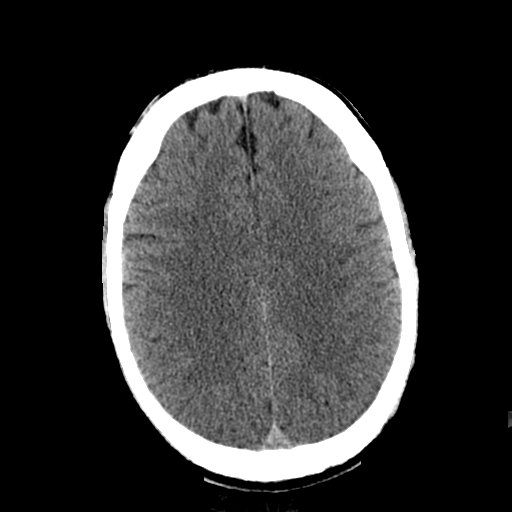
[im 23/36  brain]
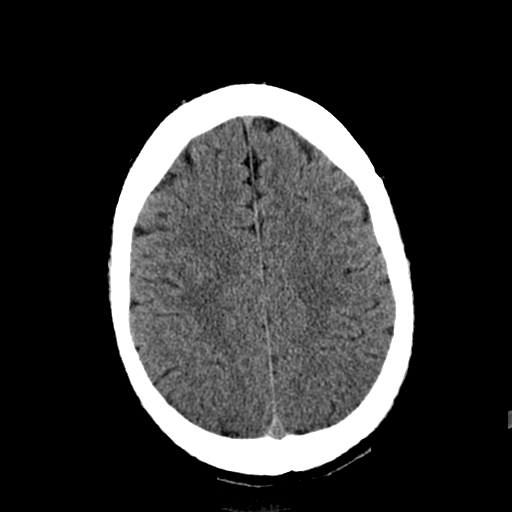
[im 26/36  brain]
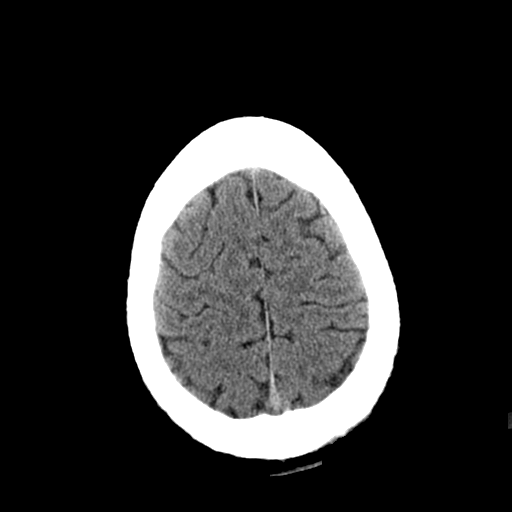
[im 27/36  brain]
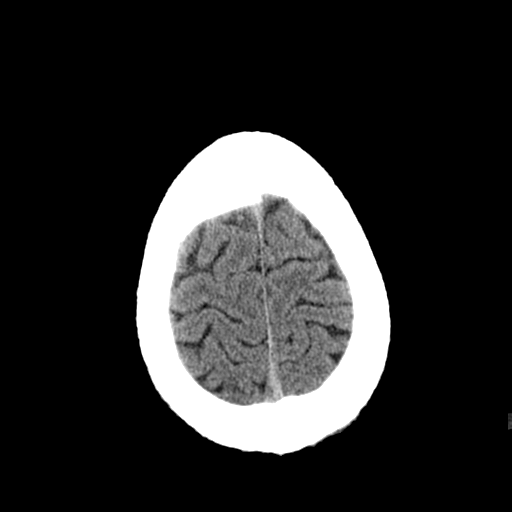
[im 27/36  bone]
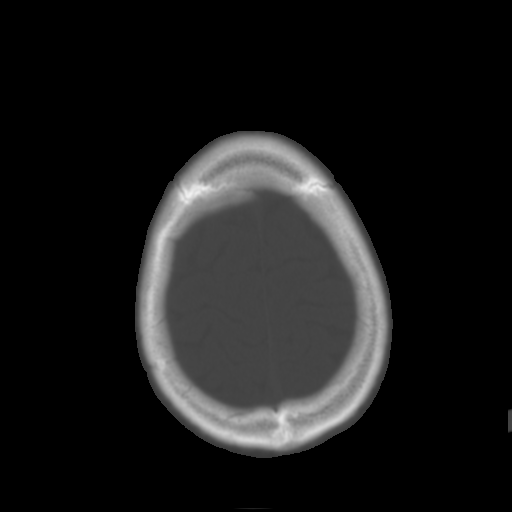
[im 29/36  brain]
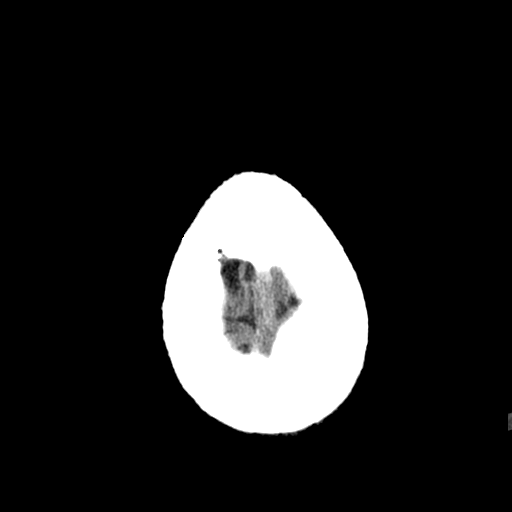
[im 32/36  brain]
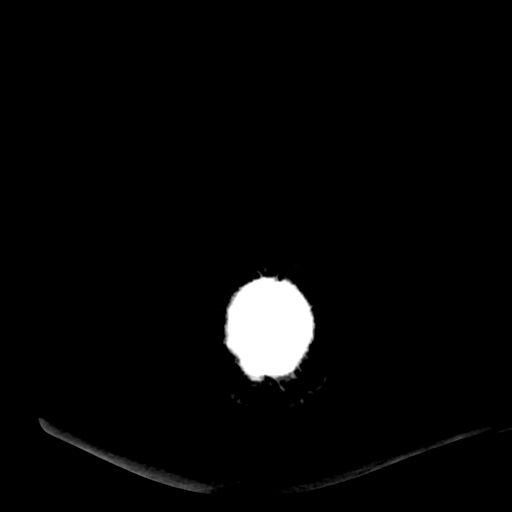
[im 34/36  brain]
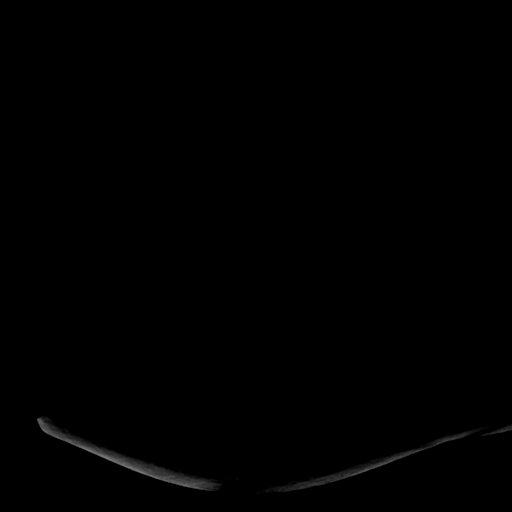

[16 of 30 positions shown; findings below may reference images not displayed]

FINDINGS: Normal anatomic variant cavum vergae with cerebral
ventricles, cerebral, brainstem, and cerebellum normal-appearing.
No acute intracerebral edema/stroke, acute intracerebral
hemorrhage, tumor, mass effect, acute extra-axial
hemorrhage/collection/tumor seen.  Slight mucosal thickening at
posterior maxillary, bilateral ethmoid air cells and minimal
sphenoid sinuses consistent with slight chronic paranasal
sinusitis.  Remaining paranasal sinuses and bilateral mastoid air
cells clear.
IMPRESSION: 1.  Slight chronic paranasal sinusitis.
2.  Otherwise, negative.

## 2011-07-11 ENCOUNTER — Encounter: Payer: Self-pay | Admitting: Internal Medicine

## 2011-07-11 ENCOUNTER — Ambulatory Visit (INDEPENDENT_AMBULATORY_CARE_PROVIDER_SITE_OTHER): Payer: BC Managed Care – PPO | Admitting: Internal Medicine

## 2011-07-11 VITALS — BP 106/80 | HR 84 | Temp 98.0°F | Wt 176.0 lb

## 2011-07-11 DIAGNOSIS — J019 Acute sinusitis, unspecified: Secondary | ICD-10-CM

## 2011-07-11 DIAGNOSIS — R112 Nausea with vomiting, unspecified: Secondary | ICD-10-CM

## 2011-07-11 DIAGNOSIS — R61 Generalized hyperhidrosis: Secondary | ICD-10-CM

## 2011-07-11 DIAGNOSIS — F450 Somatization disorder: Secondary | ICD-10-CM

## 2011-07-11 MED ORDER — AMOXICILLIN 500 MG PO CAPS: 1000.0000 mg | ORAL_CAPSULE | Freq: Two times a day (BID) | ORAL | Status: DC

## 2011-07-11 NOTE — Assessment & Plan Note (Signed)
Amoxicillin

## 2011-07-11 NOTE — Progress Notes (Signed)
  Subjective:    Patient ID: Chad Spears, male    DOB: [birth year], 30 y.o.   MRN: [id number]  HPI   F/u abd pain, nausea He lost wt on diet; running 12 mi/wk He fell at work hematoma on neck - evacuated recentely Review of Systems  Constitutional: Negative for appetite change, fatigue and unexpected weight change.  HENT: Negative for nosebleeds, congestion, sore throat, sneezing, trouble swallowing and neck pain.   Eyes: Negative for itching and visual disturbance.  Respiratory: Negative for cough.   Cardiovascular: Negative for chest pain, palpitations and leg swelling.  Gastrointestinal: Negative for nausea, diarrhea, blood in stool and abdominal distention.  Genitourinary: Negative for frequency and hematuria.  Musculoskeletal: Negative for back pain, joint swelling and gait problem.  Skin: Negative for rash.       Scar w/stitches on post neck  Neurological: Negative for dizziness, tremors, speech difficulty and weakness.  Psychiatric/Behavioral: Negative for sleep disturbance, dysphoric mood and agitation. The patient is not nervous/anxious.    Wt Readings from Last 3 Encounters:  07/11/11 176 lb (79.833 kg)  06/12/11 183 lb 6.4 oz (83.19 kg)  01/23/11 191 lb (86.637 kg)       Objective:   Physical Exam  Constitutional: He is oriented to person, place, and time. He appears well-developed.  HENT:  Mouth/Throat: Oropharynx is clear and moist.  Eyes: Conjunctivae are normal. Pupils are equal, round, and reactive to light.  Neck: Normal range of motion. No JVD present. No thyromegaly present.  Cardiovascular: Normal rate, regular rhythm, normal heart sounds and intact distal pulses.  Exam reveals no gallop and no friction rub.   No murmur heard. Pulmonary/Chest: Effort normal and breath sounds normal. No respiratory distress. He has no wheezes. He has no rales. He exhibits no tenderness.  Abdominal: Soft. Bowel sounds are normal. He exhibits no distension and no mass. There is  no tenderness. There is no rebound and no guarding.  Musculoskeletal: Normal range of motion. He exhibits no edema and no tenderness.  Lymphadenopathy:    He has no cervical adenopathy.  Neurological: He is alert and oriented to person, place, and time. He has normal reflexes. No cranial nerve deficit. He exhibits normal muscle tone. Coordination normal.  Skin: Skin is warm and dry. No rash noted.  Psychiatric: He has a normal mood and affect. His behavior is normal. Judgment and thought content normal.    5 cm suture line on post neck      Assessment & Plan:

## 2011-07-12 NOTE — Assessment & Plan Note (Signed)
Resolved

## 2011-07-12 NOTE — Assessment & Plan Note (Signed)
Better  

## 2011-07-18 ENCOUNTER — Ambulatory Visit (INDEPENDENT_AMBULATORY_CARE_PROVIDER_SITE_OTHER): Payer: BC Managed Care – PPO | Admitting: Surgery

## 2011-07-18 ENCOUNTER — Encounter (INDEPENDENT_AMBULATORY_CARE_PROVIDER_SITE_OTHER): Payer: Self-pay | Admitting: Surgery

## 2011-07-18 VITALS — BP 118/78 | HR 72

## 2011-07-18 DIAGNOSIS — Z09 Encounter for follow-up examination after completed treatment for conditions other than malignant neoplasm: Secondary | ICD-10-CM

## 2011-07-18 NOTE — Progress Notes (Signed)
Subjective:     Patient ID: Chad Spears, male   DOB: [birth year], 30 y.o.   MRN: [id number]  HPI  He is here for a followup visit status post removal of a posterior neck mass. He is doing well has no complaints. Review of Systems     Objective:   Physical Exam On examination, the incision is well-healed. I removed the sutures. There is no evidence of infection.  The final pathology showed the mass to be consistent with a lipoma.    Assessment:     Patient status post removal of posterior neck lipoma    Plan:     He may return to normal activity. I will see him back as needed.

## 2011-08-09 ENCOUNTER — Telehealth: Payer: Self-pay | Admitting: Internal Medicine

## 2011-08-09 ENCOUNTER — Telehealth: Payer: Self-pay | Admitting: *Deleted

## 2011-08-09 NOTE — Telephone Encounter (Signed)
Chad Spears, [facility] 08/09/2011 4:23 PM Signed  Pt calling requesting referral to Ortho for LBP. He states he has gone to PT since work injury and has been given pain meds but nothing is helping. He is not sleeping because of this. Please advise.    He should go through his [facility], otherwise they will not pay Thx

## 2011-08-09 NOTE — Telephone Encounter (Signed)
Pt calling requesting referral to Ortho for LBP. He states he has gone to PT since work injury and has been given pain meds but nothing is helping. He is not sleeping because of this. Please advise.

## 2011-08-12 NOTE — Telephone Encounter (Signed)
Pt informed

## 2011-08-12 NOTE — Telephone Encounter (Signed)
Called pt- not at home. Per the male I spoke to pt should be home after 1pm. Will try again after that.

## 2011-08-13 NOTE — Telephone Encounter (Signed)
Please see my previous reply Thx

## 2011-08-27 ENCOUNTER — Other Ambulatory Visit (HOSPITAL_COMMUNITY): Payer: Self-pay | Admitting: Gastroenterology

## 2011-08-27 ENCOUNTER — Other Ambulatory Visit: Payer: Self-pay | Admitting: Gastroenterology

## 2011-09-05 ENCOUNTER — Ambulatory Visit (HOSPITAL_COMMUNITY)
Admission: RE | Admit: 2011-09-05 | Discharge: 2011-09-05 | Disposition: A | Discharge status: Home / Self Care | Payer: BC Managed Care – PPO | Source: Ambulatory Visit | Attending: Gastroenterology | Admitting: Gastroenterology

## 2011-09-05 DIAGNOSIS — R109 Unspecified abdominal pain: Secondary | ICD-10-CM | POA: Insufficient documentation

## 2011-09-05 MED ORDER — IOHEXOL 300 MG/ML  SOLN: 70.0000 mL | Freq: Once | INTRAMUSCULAR | Status: AC | PRN

## 2011-10-01 ENCOUNTER — Other Ambulatory Visit (HOSPITAL_COMMUNITY): Payer: Self-pay | Admitting: Orthopaedic Surgery

## 2011-10-01 DIAGNOSIS — M549 Dorsalgia, unspecified: Secondary | ICD-10-CM

## 2011-10-08 ENCOUNTER — Ambulatory Visit (HOSPITAL_COMMUNITY)
Admission: RE | Admit: 2011-10-08 | Discharge: 2011-10-08 | Payer: BC Managed Care – PPO | Source: Ambulatory Visit | Attending: Orthopaedic Surgery | Admitting: Orthopaedic Surgery

## 2011-10-08 ENCOUNTER — Encounter (HOSPITAL_COMMUNITY)
Admission: RE | Admit: 2011-10-08 | Discharge: 2011-10-08 | Disposition: A | Discharge status: Home / Self Care | Payer: BC Managed Care – PPO | Source: Ambulatory Visit | Attending: Orthopaedic Surgery | Admitting: Orthopaedic Surgery

## 2011-10-08 DIAGNOSIS — M549 Dorsalgia, unspecified: Secondary | ICD-10-CM

## 2011-10-08 MED ORDER — TECHNETIUM TC 99M MEDRONATE IV KIT: 25.0000 | PACK | Freq: Once | INTRAVENOUS | Status: AC | PRN

## 2011-10-08 MED ORDER — TECHNETIUM TC 99M MEDRONATE IV KIT
25.0000 | PACK | Freq: Once | INTRAVENOUS | Status: AC | PRN
  Administered 2011-10-08: 25 via INTRAVENOUS

## 2011-11-14 ENCOUNTER — Encounter: Payer: Self-pay | Admitting: Internal Medicine

## 2011-11-14 ENCOUNTER — Ambulatory Visit (INDEPENDENT_AMBULATORY_CARE_PROVIDER_SITE_OTHER): Payer: BC Managed Care – PPO | Admitting: Internal Medicine

## 2011-11-14 VITALS — BP 118/78 | HR 80 | Temp 98.3°F | Resp 16 | Wt 190.0 lb

## 2011-11-14 DIAGNOSIS — F450 Somatization disorder: Secondary | ICD-10-CM

## 2011-11-14 DIAGNOSIS — K219 Gastro-esophageal reflux disease without esophagitis: Secondary | ICD-10-CM

## 2011-11-14 DIAGNOSIS — N529 Male erectile dysfunction, unspecified: Secondary | ICD-10-CM

## 2011-11-14 DIAGNOSIS — R634 Abnormal weight loss: Secondary | ICD-10-CM

## 2011-11-14 DIAGNOSIS — R61 Generalized hyperhidrosis: Secondary | ICD-10-CM

## 2011-11-14 DIAGNOSIS — R12 Heartburn: Secondary | ICD-10-CM

## 2011-11-14 DIAGNOSIS — Z23 Encounter for immunization: Secondary | ICD-10-CM

## 2011-11-14 MED ORDER — RANITIDINE HCL 150 MG PO TABS: 150.0000 mg | ORAL_TABLET | Freq: Two times a day (BID) | ORAL | Status: DC

## 2011-11-14 MED ORDER — VITAMIN D 1000 UNITS PO TABS: 1000.0000 [IU] | ORAL_TABLET | Freq: Every day | ORAL | Status: DC

## 2011-11-14 NOTE — Assessment & Plan Note (Signed)
Wt Readings from Last 3 Encounters:  11/14/11 190 lb (86.183 kg)  07/11/11 176 lb (79.833 kg)  06/12/11 183 lb 6.4 oz (83.19 kg)

## 2011-11-14 NOTE — Assessment & Plan Note (Signed)
resolved 

## 2011-11-14 NOTE — Progress Notes (Signed)
  Subjective:    Patient ID: Chad Spears, male    DOB: Mar 01, 1981, 31 y.o.   MRN: [id number]  HPI The patient presents for a follow-up of  chronic GERD/PUD - took H pylori Rx from  Dr Loreta Ave, controlled with medicines. C/o occ nausea, abd pain     Review of Systems  Constitutional: Negative for appetite change, fatigue and unexpected weight change.  HENT: Negative for nosebleeds, congestion, sore throat, sneezing, trouble swallowing and neck pain.   Eyes: Negative for itching and visual disturbance.  Respiratory: Negative for cough.   Cardiovascular: Negative for chest pain, palpitations and leg swelling.  Gastrointestinal: Positive for abdominal pain, diarrhea and abdominal distention. Negative for nausea and blood in stool.       Mild sx's   Genitourinary: Negative for frequency and hematuria.  Musculoskeletal: Negative for back pain, joint swelling and gait problem.  Skin: Negative for rash.  Neurological: Negative for dizziness, tremors, speech difficulty and weakness.  Psychiatric/Behavioral: Negative for sleep disturbance, dysphoric mood and agitation. The patient is not nervous/anxious.        Objective:   Physical Exam  Constitutional: He is oriented to person, place, and time. He appears well-developed.  HENT:  Mouth/Throat: Oropharynx is clear and moist.  Eyes: Conjunctivae are normal. Pupils are equal, round, and reactive to light.  Neck: Normal range of motion. No JVD present. No thyromegaly present.  Cardiovascular: Normal rate, regular rhythm, normal heart sounds and intact distal pulses.  Exam reveals no gallop and no friction rub.   No murmur heard. Pulmonary/Chest: Effort normal and breath sounds normal. No respiratory distress. He has no wheezes. He has no rales. He exhibits no tenderness.  Abdominal: Soft. Bowel sounds are normal. He exhibits no distension and no mass. There is no tenderness. There is no rebound and no guarding.  Musculoskeletal: Normal range of  motion. He exhibits no edema and no tenderness.  Lymphadenopathy:    He has no cervical adenopathy.  Neurological: He is alert and oriented to person, place, and time. He has normal reflexes. No cranial nerve deficit. He exhibits normal muscle tone. Coordination normal.  Skin: Skin is warm and dry. No rash noted.  Psychiatric: He has a normal mood and affect. His behavior is normal. Judgment and thought content normal.          Assessment & Plan:

## 2011-11-14 NOTE — Assessment & Plan Note (Signed)
H pylori treated in 2012

## 2011-11-14 NOTE — Assessment & Plan Note (Signed)
Psychosomatic  Try samples Cialis 5 mg prn

## 2011-11-14 NOTE — Assessment & Plan Note (Signed)
Start Zantac 

## 2011-11-14 NOTE — Assessment & Plan Note (Signed)
Chronic 1/13 - seems better

## 2011-12-16 ENCOUNTER — Encounter: Payer: Self-pay | Admitting: Internal Medicine

## 2011-12-16 ENCOUNTER — Ambulatory Visit (INDEPENDENT_AMBULATORY_CARE_PROVIDER_SITE_OTHER): Payer: BC Managed Care – PPO | Admitting: Internal Medicine

## 2011-12-16 VITALS — BP 110/68 | HR 80 | Temp 99.1°F | Resp 16 | Wt 193.0 lb

## 2011-12-16 DIAGNOSIS — J069 Acute upper respiratory infection, unspecified: Secondary | ICD-10-CM

## 2011-12-16 DIAGNOSIS — J209 Acute bronchitis, unspecified: Secondary | ICD-10-CM

## 2011-12-16 MED ORDER — PROMETHAZINE-CODEINE 6.25-10 MG/5ML PO SYRP: 5.0000 mL | ORAL_SOLUTION | ORAL | Status: DC | PRN

## 2011-12-16 MED ORDER — CEFUROXIME AXETIL 500 MG PO TABS: 500.0000 mg | ORAL_TABLET | Freq: Two times a day (BID) | ORAL | Status: DC

## 2011-12-16 NOTE — Assessment & Plan Note (Signed)
Ceftin Prom-Cod

## 2011-12-16 NOTE — Patient Instructions (Signed)
Use over-the-counter  "cold" medicines  such as "Tylenol cold" , "Advil cold",  "Mucinex" or" Mucinex D"  for cough and congestion.   Avoid decongestants if you have high blood pressure and use "Afrin" nasal spray for nasal congestion as directed instead. Use" Delsym" or" Robitussin" cough syrup varietis for cough.  You can use plain "Tylenol" or "Advil" for fever, chills and achyness.  Please, make an appointment if you are not better or if you're worse.  

## 2011-12-16 NOTE — Progress Notes (Signed)
  Subjective:    Patient ID: Chad Spears, male    DOB: 26-Apr-1981, 31 y.o.   MRN: [id number]  HPI   HPI  C/o URI sx's x  10 days. C/o ST, cough, weakness. Not better with OTC medicines. Actually, the patient is getting worse. The patient did not sleep last night due to cough.  Review of Systems  Constitutional: Positive for fever, chills and fatigue.  HENT: Positive for congestion, rhinorrhea, sneezing and postnasal drip.   Eyes: Positive for photophobia and pain. Negative for discharge and visual disturbance.  Respiratory: Positive for cough and wheezing.   Positive for chest pain.  Gastrointestinal: Negative for vomiting, abdominal pain, diarrhea and abdominal distention.  Genitourinary: Negative for dysuria and difficulty urinating.  Skin: Negative for rash.  Neurological: Positive for dizziness, weakness and light-headedness.      Review of Systems     Objective:   Physical Exam  Constitutional: He is oriented to person, place, and time. He appears well-developed.       tired  HENT:  Mouth/Throat: Oropharynx is clear and moist. No oropharyngeal exudate.       Swollen mucosa  Eyes: Conjunctivae are normal. Pupils are equal, round, and reactive to light.  Neck: Normal range of motion. No JVD present. No thyromegaly present.  Cardiovascular: Normal rate, regular rhythm, normal heart sounds and intact distal pulses.  Exam reveals no gallop and no friction rub.   No murmur heard. Pulmonary/Chest: Effort normal and breath sounds normal. No respiratory distress. He has no wheezes. He has no rales. He exhibits no tenderness.  Abdominal: Soft. Bowel sounds are normal. He exhibits no distension and no mass. There is no tenderness. There is no rebound and no guarding.  Musculoskeletal: Normal range of motion. He exhibits no edema and no tenderness.  Lymphadenopathy:    He has no cervical adenopathy.  Neurological: He is alert and oriented to person, place, and time. He has normal  reflexes. No cranial nerve deficit. He exhibits normal muscle tone. Coordination normal.  Skin: Skin is warm and dry. No rash noted.  Psychiatric: He has a normal mood and affect. His behavior is normal. Judgment and thought content normal.          Assessment & Plan:

## 2011-12-24 ENCOUNTER — Encounter: Payer: Self-pay | Admitting: Internal Medicine

## 2011-12-24 NOTE — Assessment & Plan Note (Signed)
Continue with current prescription therapy as reflected on the Med list.  

## 2011-12-31 ENCOUNTER — Other Ambulatory Visit (INDEPENDENT_AMBULATORY_CARE_PROVIDER_SITE_OTHER): Payer: BC Managed Care – PPO

## 2011-12-31 ENCOUNTER — Ambulatory Visit (INDEPENDENT_AMBULATORY_CARE_PROVIDER_SITE_OTHER): Payer: BC Managed Care – PPO | Admitting: Internal Medicine

## 2011-12-31 ENCOUNTER — Encounter: Payer: Self-pay | Admitting: *Deleted

## 2011-12-31 ENCOUNTER — Encounter: Payer: Self-pay | Admitting: Internal Medicine

## 2011-12-31 VITALS — BP 100/62 | HR 58 | Temp 98.5°F

## 2011-12-31 DIAGNOSIS — R112 Nausea with vomiting, unspecified: Secondary | ICD-10-CM

## 2011-12-31 DIAGNOSIS — K219 Gastro-esophageal reflux disease without esophagitis: Secondary | ICD-10-CM

## 2011-12-31 DIAGNOSIS — R05 Cough: Secondary | ICD-10-CM

## 2011-12-31 LAB — HEPATIC FUNCTION PANEL
ALT: 31 U/L (ref 0–53)
Albumin: 4.1 g/dL (ref 3.5–5.2)
Total Bilirubin: 0.8 mg/dL (ref 0.3–1.2)
Total Protein: 7.3 g/dL (ref 6.0–8.3)

## 2011-12-31 LAB — CBC WITH DIFFERENTIAL/PLATELET
Basophils Relative: 1 % (ref 0.0–3.0)
Eosinophils Relative: 5.2 % — ABNORMAL HIGH (ref 0.0–5.0)
HCT: 47.4 % (ref 39.0–52.0)
Lymphs Abs: 2.6 10*3/uL (ref 0.7–4.0)
MCV: 80.6 fl (ref 78.0–100.0)
Monocytes Absolute: 0.6 10*3/uL (ref 0.1–1.0)
Neutro Abs: 2.6 10*3/uL (ref 1.4–7.7)
Platelets: 218 10*3/uL (ref 150.0–400.0)
WBC: 6.3 10*3/uL (ref 4.5–10.5)

## 2011-12-31 MED ORDER — OMEPRAZOLE 40 MG PO CPDR: 40.0000 mg | DELAYED_RELEASE_CAPSULE | Freq: Every day | ORAL | Status: DC

## 2011-12-31 MED ORDER — PROMETHAZINE-CODEINE 6.25-10 MG/5ML PO SYRP: 5.0000 mL | ORAL_SOLUTION | ORAL | Status: AC | PRN

## 2011-12-31 NOTE — Progress Notes (Signed)
Subjective:    Patient ID: Chad Spears, male    DOB: 01-13-1981, 31 y.o.   MRN: [id number]  Cough This is a recurrent problem. The current episode started 1 to 4 weeks ago. The problem has been waxing and waning. The problem occurs hourly. The cough is non-productive. Associated symptoms include heartburn (n/v with cough>occ "blood" emesis after cough). Pertinent negatives include no chest pain, ear congestion, ear pain, fever, headaches, hemoptysis, myalgias, sore throat, shortness of breath or weight loss. The symptoms are aggravated by lying down. He has tried prescription cough suppressant for the symptoms. The treatment provided moderate relief.   Onset of symptoms greater than 3 weeks ago - seen here 10 days ago for same Diagnosed with bronchitis and treated with Ceftin plus codeine cough suppression  Past Medical History  Diagnosis Date  . Dermatophytosis of foot   . Esophageal reflux   . Plantar fascial fibromatosis   . TB (tuberculosis) 1998    [place], rx again in 2001 (of note he had evidence fo calcified lymph nodes in abdomen as well as some duodenal inflammation)  . Hepatitis     B- SAB/ CAB +  . Duodenitis   . Lymphedema     in right leg  . IBS (irritable bowel syndrome)   . Psychosomatic disorder   . Anxiety   . Headache   . ED (erectile dysfunction)     Review of Systems  Constitutional: Negative for fever and weight loss.  HENT: Negative for ear pain and sore throat.   Respiratory: Positive for cough. Negative for hemoptysis and shortness of breath.   Cardiovascular: Negative for chest pain.  Gastrointestinal: Positive for heartburn (n/v with cough>occ "blood" emesis after cough).  Musculoskeletal: Negative for myalgias.  Neurological: Negative for headaches.       Objective:   Physical Exam BP 100/62  Pulse 58  Temp(Src) 98.5 F (36.9 C) (Oral)  SpO2 98% Wt Readings from Last 3 Encounters:  12/16/11 193 lb (87.544 kg)  11/14/11 190 lb (86.183 kg)    07/11/11 176 lb (79.833 kg)   Constitutional:  He appears well-developed and well-nourished. No distress.  Neck: Normal range of motion. Neck supple. No JVD present. No thyromegaly present.  Cardiovascular: Normal rate, regular rhythm and normal heart sounds.  No murmur heard. no BLE edema Pulmonary/Chest: Effort normal and breath sounds normal. No respiratory distress. no wheezes.  Abdominal: Soft. Bowel sounds are normal. Patient exhibits no distension. There is no tenderness. Skin: Skin is warm and dry.  No erythema or ulceration.  Psychiatric: he has a normal mood and affect. behavior is normal. Judgment and thought content normal.   Lab Results  Component Value Date   WBC 9.0 06/20/2011   HGB 15.5 06/20/2011   HCT 44.3 06/20/2011   PLT 278 06/20/2011   GLUCOSE 95 09/27/2010   CHOL 199 03/05/2010   TRIG 68.0 03/05/2010   HDL 39.50 03/05/2010   LDLCALC 146* 03/05/2010   ALT 38 09/26/2010   AST 34 09/26/2010   NA 141 09/27/2010   K 4.3 09/27/2010   CL 104 09/27/2010   CREATININE 1.0 09/27/2010   BUN 15 09/27/2010   CO2 26 09/27/2010   TSH 2.21 03/05/2010   INR 1.08 08/23/2010        Assessment & Plan:  Cough -postinflammatory - no evidence for persisting infection following antibiotic treatment of bronchitis 2 weeks ago - will tx symptoms with prometh-cod and rest, also controlled reflux symptoms: see next  GERD -  increased vomiting with cough and gag - change H2B to PPI, pt requests "Nexium in generic" - also check CBC and LFTs given complains of epigastric pain and hematemesis - tx with prometh-cod syrup as above

## 2011-12-31 NOTE — Patient Instructions (Signed)
It was good to see you today. Continue the cough syrup for coughing and nausea symptoms, refills provided today Change ranitidine to generic omeprazole (similar to Nexium) - one capsule daily for acid suppression Your prescription(s) have been submitted to your pharmacy. Please take as directed and contact our office if you believe you are having problem(s) with the medication(s). Test(s) ordered today. Your results will be called to you after review (48-72hours after test completion). If any changes need to be made, you will be notified at that time.

## 2011-12-31 NOTE — Assessment & Plan Note (Signed)
Recurrent hx same, exac by cough post resp infection - see above for tx and eval - Abd exam bengin

## 2012-02-14 ENCOUNTER — Ambulatory Visit: Payer: BC Managed Care – PPO | Admitting: Internal Medicine

## 2012-03-20 ENCOUNTER — Encounter: Payer: Self-pay | Admitting: Internal Medicine

## 2012-03-20 ENCOUNTER — Ambulatory Visit (INDEPENDENT_AMBULATORY_CARE_PROVIDER_SITE_OTHER): Payer: BC Managed Care – PPO | Admitting: Internal Medicine

## 2012-03-20 VITALS — BP 98/70 | HR 72 | Temp 98.2°F | Resp 16 | Wt 191.0 lb

## 2012-03-20 DIAGNOSIS — M545 Low back pain: Secondary | ICD-10-CM

## 2012-03-20 DIAGNOSIS — K2980 Duodenitis without bleeding: Secondary | ICD-10-CM

## 2012-03-20 DIAGNOSIS — K219 Gastro-esophageal reflux disease without esophagitis: Secondary | ICD-10-CM

## 2012-03-20 DIAGNOSIS — F450 Somatization disorder: Secondary | ICD-10-CM

## 2012-03-20 DIAGNOSIS — K920 Hematemesis: Secondary | ICD-10-CM | POA: Insufficient documentation

## 2012-03-20 NOTE — Assessment & Plan Note (Addendum)
Seeing Ortho. Dr Jerl Santos gave him Tramadol prn - not taking it lately Denies NSAIDs use I asked him to f/u w/Ortho

## 2012-03-20 NOTE — Assessment & Plan Note (Addendum)
EGD by Dr Merri Brunette in [facility] 2/12 - [place] GI cons w/Dr Loreta Ave Risks associated with treatment omeprazole use noncompliance were discussed. Compliance was encouraged.

## 2012-03-20 NOTE — Progress Notes (Signed)
  Subjective:    Patient ID: Chad Spears, male    DOB: 10/18/1981, 31 y.o.   MRN: [id number]  HPI  C/o vomiting blood off and on x 2 wks  2-3 times a day Last time - last Sunday No abd or chest pain; he is tired. No LOC... He is a poor historian... C/o LBP - it sounds like he had a fall at work. He was seen by Ortho and had an MRI. He was given ?Tramadol (not taking)..."I need another MRI"...  Review of Systems  Constitutional: Positive for fatigue. Negative for diaphoresis, appetite change and unexpected weight change.  HENT: Negative for nosebleeds, congestion, sore throat, sneezing, trouble swallowing and neck pain.   Eyes: Negative for itching and visual disturbance.  Respiratory: Negative for cough.   Cardiovascular: Negative for chest pain, palpitations and leg swelling.  Gastrointestinal: Positive for nausea and vomiting. Negative for diarrhea, blood in stool and abdominal distention.  Genitourinary: Negative for frequency and hematuria.  Musculoskeletal: Positive for back pain. Negative for joint swelling and gait problem.  Skin: Negative for pallor and rash.  Neurological: Negative for dizziness, tremors, speech difficulty and weakness.  Psychiatric/Behavioral: Negative for sleep disturbance, dysphoric mood and agitation. The patient is not nervous/anxious.        Objective:   Physical Exam  Constitutional: He is oriented to person, place, and time. He appears well-developed.  HENT:  Mouth/Throat: Oropharynx is clear and moist.  Eyes: Conjunctivae are normal. Pupils are equal, round, and reactive to light.  Neck: Normal range of motion. No JVD present. No thyromegaly present.  Cardiovascular: Normal rate, regular rhythm, normal heart sounds and intact distal pulses.  Exam reveals no gallop and no friction rub.   No murmur heard. Pulmonary/Chest: Effort normal and breath sounds normal. No respiratory distress. He has no wheezes. He has no rales. He exhibits no tenderness.    Abdominal: Soft. Bowel sounds are normal. He exhibits no distension and no mass. There is no tenderness. There is no rebound and no guarding.  Musculoskeletal: Normal range of motion. He exhibits no edema and no tenderness.       LS is sensitive w/ROM  Lymphadenopathy:    He has no cervical adenopathy.  Neurological: He is alert and oriented to person, place, and time. He has normal reflexes. No cranial nerve deficit. He exhibits normal muscle tone. Coordination normal.  Skin: Skin is warm and dry. No rash noted.  Psychiatric: He has a normal mood and affect. His behavior is normal. Judgment and thought content normal.    Lab Results  Component Value Date   WBC 6.3 12/31/2011   HGB 15.6 12/31/2011   HCT 47.4 12/31/2011   PLT 218.0 12/31/2011   GLUCOSE 95 09/27/2010   CHOL 199 03/05/2010   TRIG 68.0 03/05/2010   HDL 39.50 03/05/2010   LDLCALC 146* 03/05/2010   ALT 31 12/31/2011   AST 28 12/31/2011   NA 141 09/27/2010   K 4.3 09/27/2010   CL 104 09/27/2010   CREATININE 1.0 09/27/2010   BUN 15 09/27/2010   CO2 26 09/27/2010   TSH 2.21 03/05/2010   INR 1.08 08/23/2010         Assessment & Plan:

## 2012-03-20 NOTE — Patient Instructions (Signed)
Go to ER if worse 

## 2012-03-20 NOTE — Assessment & Plan Note (Addendum)
Chronic Multiple complaints

## 2012-03-20 NOTE — Assessment & Plan Note (Signed)
EGD by Dr Merri Brunette in [facility] 2/12 - [place] Continue with current prescription therapy as reflected on the Med list.

## 2012-03-20 NOTE — Assessment & Plan Note (Signed)
EGD by Dr Merri Brunette in [facility] 2/12 - [place]

## 2012-07-02 ENCOUNTER — Encounter: Payer: Self-pay | Admitting: Internal Medicine

## 2012-07-02 ENCOUNTER — Ambulatory Visit (INDEPENDENT_AMBULATORY_CARE_PROVIDER_SITE_OTHER): Payer: BC Managed Care – PPO | Admitting: Internal Medicine

## 2012-07-02 VITALS — BP 120/88 | HR 76 | Temp 98.5°F | Resp 16 | Wt 200.0 lb

## 2012-07-02 DIAGNOSIS — M545 Low back pain: Secondary | ICD-10-CM

## 2012-07-02 DIAGNOSIS — R112 Nausea with vomiting, unspecified: Secondary | ICD-10-CM

## 2012-07-02 NOTE — Progress Notes (Signed)
  Subjective:    Patient ID: Chad Spears, male    DOB: August 05, 1981, 30 y.o.   MRN: [id number]  HPI  F/u on vomiting blood off and on x 2 wks  2-3 times a day in May 2013 No abd or chest pain; he is tired. No LOC... He is a poor historian... C/o LBP - it sounds like he had a fall at work. He was seen by Ortho and had an x-ray and an LS spine MRI. Pain is not better...  Review of Systems  Constitutional: Positive for fatigue. Negative for diaphoresis, appetite change and unexpected weight change.  HENT: Negative for nosebleeds, congestion, sore throat, sneezing, trouble swallowing and neck pain.   Eyes: Negative for itching and visual disturbance.  Respiratory: Negative for cough.   Cardiovascular: Negative for chest pain, palpitations and leg swelling.  Gastrointestinal: Positive for nausea and vomiting. Negative for diarrhea, blood in stool and abdominal distention.  Genitourinary: Negative for frequency and hematuria.  Musculoskeletal: Positive for back pain. Negative for joint swelling and gait problem.  Skin: Negative for pallor and rash.  Neurological: Negative for dizziness, tremors, speech difficulty and weakness.  Psychiatric/Behavioral: Negative for disturbed wake/sleep cycle, dysphoric mood and agitation. The patient is not nervous/anxious.        Objective:   Physical Exam  Constitutional: He is oriented to person, place, and time. He appears well-developed.  HENT:  Mouth/Throat: Oropharynx is clear and moist.  Eyes: Conjunctivae are normal. Pupils are equal, round, and reactive to light.  Neck: Normal range of motion. No JVD present. No thyromegaly present.  Cardiovascular: Normal rate, regular rhythm, normal heart sounds and intact distal pulses.  Exam reveals no gallop and no friction rub.   No murmur heard. Pulmonary/Chest: Effort normal and breath sounds normal. No respiratory distress. He has no wheezes. He has no rales. He exhibits no tenderness.  Abdominal: Soft.  Bowel sounds are normal. He exhibits no distension and no mass. There is no tenderness. There is no rebound and no guarding.  Musculoskeletal: Normal range of motion. He exhibits no edema and no tenderness.       LS is sensitive w/ROM  Lymphadenopathy:    He has no cervical adenopathy.  Neurological: He is alert and oriented to person, place, and time. He has normal reflexes. No cranial nerve deficit. He exhibits normal muscle tone. Coordination normal.  Skin: Skin is warm and dry. No rash noted.  Psychiatric: He has a normal mood and affect. His behavior is normal. Judgment and thought content normal.    Lab Results  Component Value Date   WBC 6.3 12/31/2011   HGB 15.6 12/31/2011   HCT 47.4 12/31/2011   PLT 218.0 12/31/2011   GLUCOSE 95 09/27/2010   CHOL 199 03/05/2010   TRIG 68.0 03/05/2010   HDL 39.50 03/05/2010   LDLCALC 146* 03/05/2010   ALT 31 12/31/2011   AST 28 12/31/2011   NA 141 09/27/2010   K 4.3 09/27/2010   CL 104 09/27/2010   CREATININE 1.0 09/27/2010   BUN 15 09/27/2010   CO2 26 09/27/2010   TSH 2.21 03/05/2010   INR 1.08 08/23/2010         Assessment & Plan:

## 2012-07-02 NOTE — Assessment & Plan Note (Signed)
He is not taking any meds. Discussed. F/u w/Ortho

## 2012-07-02 NOTE — Assessment & Plan Note (Signed)
Resolved

## 2012-07-24 ENCOUNTER — Other Ambulatory Visit: Payer: Self-pay | Admitting: Internal Medicine

## 2012-11-20 ENCOUNTER — Ambulatory Visit (INDEPENDENT_AMBULATORY_CARE_PROVIDER_SITE_OTHER): Payer: BC Managed Care – PPO | Admitting: Internal Medicine

## 2012-11-20 ENCOUNTER — Other Ambulatory Visit (INDEPENDENT_AMBULATORY_CARE_PROVIDER_SITE_OTHER): Payer: BC Managed Care – PPO

## 2012-11-20 ENCOUNTER — Encounter: Payer: Self-pay | Admitting: Internal Medicine

## 2012-11-20 VITALS — BP 100/80 | HR 72 | Temp 98.1°F | Resp 16 | Wt 203.0 lb

## 2012-11-20 DIAGNOSIS — F450 Somatization disorder: Secondary | ICD-10-CM

## 2012-11-20 DIAGNOSIS — N529 Male erectile dysfunction, unspecified: Secondary | ICD-10-CM

## 2012-11-20 DIAGNOSIS — M545 Low back pain: Secondary | ICD-10-CM

## 2012-11-20 DIAGNOSIS — F411 Generalized anxiety disorder: Secondary | ICD-10-CM

## 2012-11-20 DIAGNOSIS — M542 Cervicalgia: Secondary | ICD-10-CM

## 2012-11-20 LAB — URINALYSIS
Nitrite: NEGATIVE
Specific Gravity, Urine: >=1.03 (ref 1.000–1.030)
Total Protein, Urine: NEGATIVE
Urine Glucose: NEGATIVE
Urobilinogen, UA: 0.2 (ref 0.0–1.0)

## 2012-11-20 LAB — TESTOSTERONE: Testosterone: 502.08 ng/dL (ref 350.00–890.00)

## 2012-11-20 LAB — HEPATIC FUNCTION PANEL
Bilirubin, Direct: 0.1 mg/dL (ref 0.0–0.3)
Total Bilirubin: 1 mg/dL (ref 0.3–1.2)

## 2012-11-20 LAB — BASIC METABOLIC PANEL
Calcium: 9.6 mg/dL (ref 8.4–10.5)
Creatinine, Ser: 0.9 mg/dL (ref 0.4–1.5)

## 2012-11-20 LAB — VITAMIN B12: Vitamin B-12: 655 pg/mL (ref 211–911)

## 2012-11-20 LAB — TSH: TSH: 1.26 u[IU]/mL (ref 0.35–5.50)

## 2012-11-20 MED ORDER — VARDENAFIL HCL 20 MG PO TABS: 20.0000 mg | ORAL_TABLET | Freq: Every day | ORAL | Status: DC | PRN

## 2012-11-20 NOTE — Assessment & Plan Note (Signed)
Discussed.

## 2012-11-20 NOTE — Progress Notes (Signed)
Subjective:    Back Pain This is a chronic problem. The pain is present in the lumbar spine. The quality of the pain is described as burning. The pain is at a severity of 5/10. The pain is moderate. Pertinent negatives include no chest pain or weakness. The treatment provided mild relief.  Neck Pain  This is a chronic problem. The pain is at a severity of 5/10. The pain is moderate. Pertinent negatives include no chest pain, trouble swallowing or weakness. He has tried acetaminophen and NSAIDs for the symptoms. The treatment provided mild relief.    F/u on vomiting blood off and on x 2 wks  2-3 times a day in May 2013 No abd or chest pain; he is tired. No LOC... He is a poor historian... C/o LBP - it sounds like he had a fall at work. He was seen by Ortho and had an x-ray and an LS spine MRI. Pain is not better...  Review of Systems  Constitutional: Positive for fatigue. Negative for diaphoresis, appetite change and unexpected weight change.  HENT: Positive for neck pain. Negative for nosebleeds, congestion, sore throat, sneezing and trouble swallowing.   Eyes: Negative for itching and visual disturbance.  Respiratory: Negative for cough.   Cardiovascular: Negative for chest pain, palpitations and leg swelling.  Gastrointestinal: Positive for nausea and vomiting. Negative for diarrhea, blood in stool and abdominal distention.  Genitourinary: Negative for frequency and hematuria.  Musculoskeletal: Positive for back pain. Negative for joint swelling and gait problem.  Skin: Negative for pallor and rash.  Neurological: Negative for dizziness, tremors, speech difficulty and weakness.  Psychiatric/Behavioral: Negative for suicidal ideas, behavioral problems, sleep disturbance, self-injury, dysphoric mood and agitation. The patient is nervous/anxious.        Objective:   Physical Exam  Constitutional: He is oriented to person, place, and time. He appears well-developed.  HENT:    Mouth/Throat: Oropharynx is clear and moist.  Eyes: Conjunctivae normal are normal. Pupils are equal, round, and reactive to light.  Neck: Normal range of motion. No JVD present. No thyromegaly present.  Cardiovascular: Normal rate, regular rhythm, normal heart sounds and intact distal pulses.  Exam reveals no gallop and no friction rub.   No murmur heard. Pulmonary/Chest: Effort normal and breath sounds normal. No respiratory distress. He has no wheezes. He has no rales. He exhibits no tenderness.  Abdominal: Soft. Bowel sounds are normal. He exhibits no distension and no mass. There is no tenderness. There is no rebound and no guarding.  Musculoskeletal: Normal range of motion. He exhibits no edema and no tenderness.       LS is sensitive w/ROM  Lymphadenopathy:    He has no cervical adenopathy.  Neurological: He is alert and oriented to person, place, and time. He has normal reflexes. No cranial nerve deficit. He exhibits normal muscle tone. Coordination normal.  Skin: Skin is warm and dry. No rash noted.  Psychiatric: He has a normal mood and affect. His behavior is normal. Judgment and thought content normal.    Lab Results  Component Value Date   WBC 6.3 12/31/2011   HGB 15.6 12/31/2011   HCT 47.4 12/31/2011   PLT 218.0 12/31/2011   GLUCOSE 95 09/27/2010   CHOL 199 03/05/2010   TRIG 68.0 03/05/2010   HDL 39.50 03/05/2010   LDLCALC 146* 03/05/2010   ALT 31 12/31/2011   AST 28 12/31/2011   NA 141 09/27/2010   K 4.3 09/27/2010   CL 104 09/27/2010  CREATININE 1.0 09/27/2010   BUN 15 09/27/2010   CO2 26 09/27/2010   TSH 2.21 03/05/2010   INR 1.08 08/23/2010         Assessment & Plan:

## 2012-11-20 NOTE — Assessment & Plan Note (Signed)
Sports med consult 

## 2012-11-20 NOTE — Assessment & Plan Note (Signed)
cialis did not help Check testosterone

## 2012-12-03 ENCOUNTER — Ambulatory Visit (INDEPENDENT_AMBULATORY_CARE_PROVIDER_SITE_OTHER): Payer: BC Managed Care – PPO | Admitting: Sports Medicine

## 2012-12-03 VITALS — BP 113/77 | Ht 71.0 in | Wt 190.0 lb

## 2012-12-03 DIAGNOSIS — M722 Plantar fascial fibromatosis: Secondary | ICD-10-CM

## 2012-12-03 DIAGNOSIS — M25562 Pain in left knee: Secondary | ICD-10-CM

## 2012-12-03 DIAGNOSIS — M25569 Pain in unspecified knee: Secondary | ICD-10-CM

## 2012-12-03 DIAGNOSIS — M25561 Pain in right knee: Secondary | ICD-10-CM

## 2012-12-03 DIAGNOSIS — B353 Tinea pedis: Secondary | ICD-10-CM

## 2012-12-03 DIAGNOSIS — M545 Low back pain: Secondary | ICD-10-CM

## 2012-12-03 NOTE — Progress Notes (Signed)
Patient is a 32 y/o male with no significant PMH and taking no medications who presents today with complains of bilateral medial heel pain and knee pain.  States he has had 7/10 sharp, constant pain of heels and knees for about one year, aggravated by walking and standing for long periods of time, some relief with rest.  States OTC pain med NSAIDS and tramadol has not helped his pain, nor have knee braces.  Heel pain is worst in middle of the night when he gets up to go to the bathroom and in the morning when he gets out of bed.  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    ROS: Negative except as above  Physical Exam: General: well-developed, well-nourished, NAD Knees: -stable to varus and valgus testing bilaterally; reports tenderness in medial aspect upon valgus testing - negative anterior/posterior drawer and McMurray's test bilaterally -TTP medially infrapatellar aspect -good range of motion on flexion and extension -no effusions or erythema noted Feet: -TTP of medial aspect of calcaneus along plantar fascia -no effusions noted - good ankle ROM -good calcaneal inversion when patient stands on toes -5/5 strength -On walking, patient noted to pronate feet, R>L -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 Walking without a limp.  MSK ultrasound: Plantar fascia of each foot was evaluated. There is hypoechogenicity at the origin of the plantar fascia at the calcaneus in each heel. Small heel spur on the right. Plantar fascia thickness is within normal limits on the right but slightly enlarged at 0.5 cm on the left. No significant increase in neovascularity.  A/P: Plantar fascitis Bilateral knee pain  Patient with tenderness to palpation along plantar fascia, S/Sx consistent with PF.  [place] of both heels done this visit, showing that PF in left foot is slightly thicker than right foot, slight heel  spur noted of right calcaneus.  Patient encouraged to ice feet by using frozen water bottles and rolling them under feet for 10 minutes at a time.  Has also been given green insoles, scaphoid pads, and arch straps to wear. He will start plantar fascial stretches daily. I believe his medial knee pain is likely due to his pronation. Hopefully this will improve with his inserts.  Followup in 4 weeks to evaluate progress. If he finds the temporary inserts to be helpful, then we could consider custom orthotics down the road.

## 2012-12-31 ENCOUNTER — Ambulatory Visit: Payer: BC Managed Care – PPO | Admitting: Sports Medicine

## 2012-12-31 ENCOUNTER — Ambulatory Visit (INDEPENDENT_AMBULATORY_CARE_PROVIDER_SITE_OTHER): Payer: BC Managed Care – PPO | Admitting: Internal Medicine

## 2012-12-31 ENCOUNTER — Encounter: Payer: Self-pay | Admitting: Internal Medicine

## 2012-12-31 VITALS — BP 100/70 | HR 76 | Temp 97.6°F | Resp 16 | Wt 204.0 lb

## 2012-12-31 DIAGNOSIS — F450 Somatization disorder: Secondary | ICD-10-CM

## 2012-12-31 DIAGNOSIS — M545 Low back pain: Secondary | ICD-10-CM

## 2012-12-31 DIAGNOSIS — Z23 Encounter for immunization: Secondary | ICD-10-CM

## 2012-12-31 DIAGNOSIS — R12 Heartburn: Secondary | ICD-10-CM

## 2012-12-31 DIAGNOSIS — N529 Male erectile dysfunction, unspecified: Secondary | ICD-10-CM

## 2012-12-31 DIAGNOSIS — M542 Cervicalgia: Secondary | ICD-10-CM

## 2012-12-31 MED ORDER — VITAMIN D 1000 UNITS PO TABS: 1000.0000 [IU] | ORAL_TABLET | Freq: Every day | ORAL | Status: DC

## 2012-12-31 MED ORDER — OMEPRAZOLE 40 MG PO CPDR: 20.0000 mg | DELAYED_RELEASE_CAPSULE | Freq: Every day | ORAL | Status: DC

## 2012-12-31 NOTE — Assessment & Plan Note (Signed)
Levitra prn 

## 2012-12-31 NOTE — Assessment & Plan Note (Signed)
Continue with current therapy.  

## 2012-12-31 NOTE — Progress Notes (Signed)
Subjective:    Back Pain This is a chronic problem. The pain is present in the lumbar spine. The quality of the pain is described as burning. The pain is at a severity of 5/10. The pain is moderate. Pertinent negatives include no chest pain or weakness. The treatment provided mild relief.  Neck Pain  This is a chronic problem. The pain is at a severity of 5/10. The pain is moderate. Pertinent negatives include no chest pain, trouble swallowing or weakness. He has tried acetaminophen and NSAIDs for the symptoms. The treatment provided mild relief.    No abd or chest pain; he is tired.  He is attending [employer] now He is a poor historian... C/o LBP - it sounds like he had a fall at work. He was seen by Ortho and had an x-ray and an LS spine MRI. Pain is not better...  Review of Systems  Constitutional: Positive for fatigue. Negative for diaphoresis, appetite change and unexpected weight change.  HENT: Positive for neck pain. Negative for nosebleeds, congestion, sore throat, sneezing and trouble swallowing.   Eyes: Negative for itching and visual disturbance.  Respiratory: Negative for cough.   Cardiovascular: Negative for chest pain, palpitations and leg swelling.  Gastrointestinal: Positive for nausea and vomiting. Negative for diarrhea, blood in stool and abdominal distention.  Genitourinary: Negative for frequency and hematuria.  Musculoskeletal: Positive for back pain. Negative for joint swelling and gait problem.  Skin: Negative for pallor and rash.  Neurological: Negative for dizziness, tremors, speech difficulty and weakness.  Psychiatric/Behavioral: Negative for suicidal ideas, behavioral problems, sleep disturbance, self-injury, dysphoric mood and agitation. The patient is nervous/anxious.        Objective:   Physical Exam  Constitutional: He is oriented to person, place, and time. He appears well-developed.  HENT:  Mouth/Throat: Oropharynx is clear and moist.  Eyes:  Conjunctivae are normal. Pupils are equal, round, and reactive to light.  Neck: Normal range of motion. No JVD present. No thyromegaly present.  Cardiovascular: Normal rate, regular rhythm, normal heart sounds and intact distal pulses.  Exam reveals no gallop and no friction rub.   No murmur heard. Pulmonary/Chest: Effort normal and breath sounds normal. No respiratory distress. He has no wheezes. He has no rales. He exhibits no tenderness.  Abdominal: Soft. Bowel sounds are normal. He exhibits no distension and no mass. There is no tenderness. There is no rebound and no guarding.  Musculoskeletal: Normal range of motion. He exhibits no edema and no tenderness.  LS is sensitive w/ROM  Lymphadenopathy:    He has no cervical adenopathy.  Neurological: He is alert and oriented to person, place, and time. He has normal reflexes. No cranial nerve deficit. He exhibits normal muscle tone. Coordination normal.  Skin: Skin is warm and dry. No rash noted.  Psychiatric: He has a normal mood and affect. His behavior is normal. Judgment and thought content normal.    Lab Results  Component Value Date   WBC 6.3 12/31/2011   HGB 15.6 12/31/2011   HCT 47.4 12/31/2011   PLT 218.0 12/31/2011   GLUCOSE 88 11/20/2012   CHOL 199 03/05/2010   TRIG 68.0 03/05/2010   HDL 39.50 03/05/2010   LDLCALC 146* 03/05/2010   ALT 29 11/20/2012   AST 25 11/20/2012   NA 142 11/20/2012   K 3.8 11/20/2012   CL 109 11/20/2012   CREATININE 0.9 11/20/2012   BUN 15 11/20/2012   CO2 25 11/20/2012   TSH 1.26 11/20/2012   INR  1.08 08/23/2010         Assessment & Plan:

## 2012-12-31 NOTE — Assessment & Plan Note (Signed)
Nexium was too $$ Prilosec

## 2012-12-31 NOTE — Assessment & Plan Note (Signed)
Discussed.

## 2013-01-06 DIAGNOSIS — Z23 Encounter for immunization: Secondary | ICD-10-CM

## 2013-01-14 ENCOUNTER — Encounter: Payer: Self-pay | Admitting: Sports Medicine

## 2013-01-14 ENCOUNTER — Ambulatory Visit (INDEPENDENT_AMBULATORY_CARE_PROVIDER_SITE_OTHER): Payer: BC Managed Care – PPO | Admitting: Sports Medicine

## 2013-01-14 VITALS — BP 121/85 | HR 67 | Ht 71.0 in | Wt 204.0 lb

## 2013-01-14 DIAGNOSIS — M722 Plantar fascial fibromatosis: Secondary | ICD-10-CM

## 2013-01-14 MED ORDER — IBUPROFEN 800 MG PO TABS: 800.0000 mg | ORAL_TABLET | Freq: Two times a day (BID) | ORAL | Status: DC | PRN

## 2013-01-17 NOTE — Progress Notes (Signed)
  Subjective:    Patient ID: Chad Spears, male    DOB: 1981-10-17, 32 y.o.   MRN: [id number]  HPI Patient comes in today for followup on bilateral plantar fasciitis. Still having pain despite green sports insoles with scaphoid pads. He states that he has been compliant with his home exercises. Still localizes his pain to each heel. Symptoms have been present now for about a year.    Review of Systems     Objective:   Physical Exam Well-developed, well-nourished. No acute distress  Patient still with pes planus and pronation with walking even with green sports insoles in place. He is still tender to palpation at the calcaneal plantar fascia origin bilaterally. Negative calcaneal squeeze. No soft tissue swelling. He is neurovascularly intact distally. Walking without a significant limp.      Assessment & Plan:  1. Chronic bilateral heel pain secondary to plantar fasciitis  Patient would benefit most from a custom orthotic. I've asked him to check with his insurance company first. In the meantime, we will try some heel cups in addition to his green sports insoles and scaphoid pads. Continue with home exercise program and daily icing. Ibuprofen 800 mg 3 times a day with food when necessary. Followup in 6 weeks at which point I am hoping to fit him with custom orthotics.

## 2013-01-21 ENCOUNTER — Ambulatory Visit (INDEPENDENT_AMBULATORY_CARE_PROVIDER_SITE_OTHER): Payer: BC Managed Care – PPO | Admitting: Sports Medicine

## 2013-01-21 VITALS — BP 106/72 | Ht 71.0 in | Wt 204.0 lb

## 2013-01-21 DIAGNOSIS — M722 Plantar fascial fibromatosis: Secondary | ICD-10-CM

## 2013-01-22 NOTE — Progress Notes (Signed)
Patient ID: Chad Spears, male   DOB: Sep 16, 1981, 32 y.o.   MRN: [id number]  Patient comes in today for custom orthotics. He has a history of plantar fasciitis.  Patient was fitted for a : standard, cushioned, semi-rigid orthotic. The orthotic was heated and afterward the patient stood on the orthotic blank positioned on the orthotic stand. The patient was positioned in subtalar neutral position and 10 degrees of ankle dorsiflexion in a weight bearing stance. After completion of molding, a stable base was applied to the orthotic blank. The blank was ground to a stable position for weight bearing. Size:13 Base: [facility] and [place]:  Bilateral metatarsal pads The patient ambulated these, and they were very comfortable.   Follow up PRN.

## 2013-01-28 ENCOUNTER — Ambulatory Visit (INDEPENDENT_AMBULATORY_CARE_PROVIDER_SITE_OTHER): Payer: BC Managed Care – PPO | Admitting: Internal Medicine

## 2013-01-28 ENCOUNTER — Encounter: Payer: Self-pay | Admitting: Internal Medicine

## 2013-01-28 ENCOUNTER — Other Ambulatory Visit (INDEPENDENT_AMBULATORY_CARE_PROVIDER_SITE_OTHER): Payer: BC Managed Care – PPO

## 2013-01-28 VITALS — BP 100/72 | HR 58 | Temp 98.0°F | Wt 200.4 lb

## 2013-01-28 DIAGNOSIS — R300 Dysuria: Secondary | ICD-10-CM

## 2013-01-28 DIAGNOSIS — F411 Generalized anxiety disorder: Secondary | ICD-10-CM

## 2013-01-28 DIAGNOSIS — N529 Male erectile dysfunction, unspecified: Secondary | ICD-10-CM

## 2013-01-28 DIAGNOSIS — M545 Low back pain: Secondary | ICD-10-CM

## 2013-01-28 LAB — URINALYSIS
Nitrite: NEGATIVE
Specific Gravity, Urine: 1.02 (ref 1.000–1.030)
Total Protein, Urine: NEGATIVE
Urine Glucose: NEGATIVE
pH: 7.5 (ref 5.0–8.0)

## 2013-01-28 MED ORDER — IBUPROFEN 800 MG PO TABS: 800.0000 mg | ORAL_TABLET | Freq: Two times a day (BID) | ORAL | Status: DC | PRN

## 2013-01-28 MED ORDER — DOXYCYCLINE HYCLATE 100 MG PO TABS: 100.0000 mg | ORAL_TABLET | Freq: Two times a day (BID) | ORAL | Status: DC

## 2013-01-28 NOTE — Progress Notes (Signed)
Patient ID: Chad Spears, male   DOB: Feb 22, 1981, 32 y.o.   MRN: [id number]  Subjective:    Dysuria  This is a new problem. Associated symptoms include nausea and vomiting. Pertinent negatives include no frequency or hematuria.    No abd or chest pain; he is tired.  He is attending [employer] now He is a poor historian... C/o LBP - it sounds like he had a fall at work. He was seen by Ortho and had an x-ray and an LS spine MRI. Pain is not better...  Review of Systems  Constitutional: Positive for fatigue. Negative for diaphoresis, appetite change and unexpected weight change.  HENT: Negative for nosebleeds, congestion, sore throat and sneezing.   Eyes: Negative for itching and visual disturbance.  Respiratory: Negative for cough.   Cardiovascular: Negative for palpitations and leg swelling.  Gastrointestinal: Positive for nausea and vomiting. Negative for diarrhea, blood in stool and abdominal distention.  Genitourinary: Positive for dysuria. Negative for frequency and hematuria.  Musculoskeletal: Negative for joint swelling and gait problem.  Skin: Negative for pallor and rash.  Neurological: Negative for dizziness, tremors and speech difficulty.  Psychiatric/Behavioral: Negative for suicidal ideas, behavioral problems, sleep disturbance, self-injury, dysphoric mood and agitation. The patient is nervous/anxious.        Objective:   Physical Exam  Constitutional: He is oriented to person, place, and time. He appears well-developed.  HENT:  Mouth/Throat: Oropharynx is clear and moist.  Eyes: Conjunctivae are normal. Pupils are equal, round, and reactive to light.  Neck: Normal range of motion. No JVD present. No thyromegaly present.  Cardiovascular: Normal rate, regular rhythm, normal heart sounds and intact distal pulses.  Exam reveals no gallop and no friction rub.   No murmur heard. Pulmonary/Chest: Effort normal and breath sounds normal. No respiratory distress. He has no wheezes. He  has no rales. He exhibits no tenderness.  Abdominal: Soft. Bowel sounds are normal. He exhibits no distension and no mass. There is no tenderness. There is no rebound and no guarding.  Musculoskeletal: Normal range of motion. He exhibits no edema and no tenderness.  LS is sensitive w/ROM  Lymphadenopathy:    He has no cervical adenopathy.  Neurological: He is alert and oriented to person, place, and time. He has normal reflexes. No cranial nerve deficit. He exhibits normal muscle tone. Coordination normal.  Skin: Skin is warm and dry. No rash noted.  Psychiatric: He has a normal mood and affect. His behavior is normal. Judgment and thought content normal.    Lab Results  Component Value Date   WBC 6.3 12/31/2011   HGB 15.6 12/31/2011   HCT 47.4 12/31/2011   PLT 218.0 12/31/2011   GLUCOSE 88 11/20/2012   CHOL 199 03/05/2010   TRIG 68.0 03/05/2010   HDL 39.50 03/05/2010   LDLCALC 146* 03/05/2010   ALT 29 11/20/2012   AST 25 11/20/2012   NA 142 11/20/2012   K 3.8 11/20/2012   CL 109 11/20/2012   CREATININE 0.9 11/20/2012   BUN 15 11/20/2012   CO2 25 11/20/2012   TSH 1.26 11/20/2012   INR 1.08 08/23/2010         Assessment & Plan:

## 2013-01-28 NOTE — Assessment & Plan Note (Signed)
3/14 2 wks - ?urethritis vs prostatitis UA Urine chlamydia test  Start Doxycycline Naproxen prn

## 2013-01-31 NOTE — Assessment & Plan Note (Signed)
Discussed.

## 2013-01-31 NOTE — Assessment & Plan Note (Signed)
Anxiety discussed

## 2013-03-04 ENCOUNTER — Encounter: Payer: BC Managed Care – PPO | Admitting: Sports Medicine

## 2013-04-02 ENCOUNTER — Encounter: Payer: Self-pay | Admitting: Internal Medicine

## 2013-04-02 ENCOUNTER — Ambulatory Visit (INDEPENDENT_AMBULATORY_CARE_PROVIDER_SITE_OTHER): Payer: BC Managed Care – PPO | Admitting: Internal Medicine

## 2013-04-02 VITALS — BP 110/78 | HR 72 | Temp 97.6°F | Resp 16 | Wt 204.0 lb

## 2013-04-02 DIAGNOSIS — R12 Heartburn: Secondary | ICD-10-CM

## 2013-04-02 DIAGNOSIS — R634 Abnormal weight loss: Secondary | ICD-10-CM

## 2013-04-02 DIAGNOSIS — M545 Low back pain: Secondary | ICD-10-CM

## 2013-04-02 MED ORDER — OMEPRAZOLE 40 MG PO CPDR: 20.0000 mg | DELAYED_RELEASE_CAPSULE | Freq: Every day | ORAL | Status: DC

## 2013-04-02 NOTE — Assessment & Plan Note (Signed)
Wt Readings from Last 3 Encounters:  04/02/13 204 lb (92.534 kg)  01/28/13 200 lb 6.4 oz (90.901 kg)  01/21/13 204 lb (92.534 kg)

## 2013-04-02 NOTE — Progress Notes (Signed)
   Subjective:    HPI  No abd or chest pain; he is tired. Occ HAs He is attending [employer] now He is a poor historian... F/uLBP - it sounds like he had a fall at work. He was seen by Ortho and had an x-ray and an LS spine MRI. Pain is better a little...  Review of Systems  Constitutional: Positive for fatigue. Negative for diaphoresis, appetite change and unexpected weight change.  HENT: Negative for nosebleeds, congestion, sore throat and sneezing.   Eyes: Negative for itching and visual disturbance.  Respiratory: Negative for cough.   Cardiovascular: Negative for palpitations and leg swelling.  Gastrointestinal: Negative for diarrhea, blood in stool and abdominal distention.  Musculoskeletal: Negative for joint swelling and gait problem.  Skin: Negative for pallor and rash.  Neurological: Negative for dizziness, tremors and speech difficulty.  Psychiatric/Behavioral: Negative for suicidal ideas, behavioral problems, sleep disturbance, self-injury, dysphoric mood and agitation. The patient is nervous/anxious.        Objective:   Physical Exam  Constitutional: He is oriented to person, place, and time. He appears well-developed.  HENT:  Mouth/Throat: Oropharynx is clear and moist.  Eyes: Conjunctivae are normal. Pupils are equal, round, and reactive to light.  Neck: Normal range of motion. No JVD present. No thyromegaly present.  Cardiovascular: Normal rate, regular rhythm, normal heart sounds and intact distal pulses.  Exam reveals no gallop and no friction rub.   No murmur heard. Pulmonary/Chest: Effort normal and breath sounds normal. No respiratory distress. He has no wheezes. He has no rales. He exhibits no tenderness.  Abdominal: Soft. Bowel sounds are normal. He exhibits no distension and no mass. There is no tenderness. There is no rebound and no guarding.  Musculoskeletal: Normal range of motion. He exhibits no edema and no tenderness.  LS is sensitive w/ROM   Lymphadenopathy:    He has no cervical adenopathy.  Neurological: He is alert and oriented to person, place, and time. He has normal reflexes. No cranial nerve deficit. He exhibits normal muscle tone. Coordination normal.  Skin: Skin is warm and dry. No rash noted.  Psychiatric: He has a normal mood and affect. His behavior is normal. Judgment and thought content normal.    Lab Results  Component Value Date   WBC 6.3 12/31/2011   HGB 15.6 12/31/2011   HCT 47.4 12/31/2011   PLT 218.0 12/31/2011   GLUCOSE 88 11/20/2012   CHOL 199 03/05/2010   TRIG 68.0 03/05/2010   HDL 39.50 03/05/2010   LDLCALC 146* 03/05/2010   ALT 29 11/20/2012   AST 25 11/20/2012   NA 142 11/20/2012   K 3.8 11/20/2012   CL 109 11/20/2012   CREATININE 0.9 11/20/2012   BUN 15 11/20/2012   CO2 25 11/20/2012   TSH 1.26 11/20/2012   INR 1.08 08/23/2010         Assessment & Plan:

## 2013-04-02 NOTE — Assessment & Plan Note (Signed)
Continue with current  therapy as reflected on the Med list.  

## 2013-04-02 NOTE — Assessment & Plan Note (Signed)
Continue with current prescription therapy as reflected on the Med list.  

## 2013-04-25 ENCOUNTER — Other Ambulatory Visit: Payer: Self-pay | Admitting: Internal Medicine

## 2013-06-22 ENCOUNTER — Ambulatory Visit: Payer: BC Managed Care – PPO | Admitting: Emergency Medicine

## 2013-06-22 ENCOUNTER — Ambulatory Visit: Payer: BC Managed Care – PPO

## 2013-06-22 VITALS — BP 118/80 | HR 65 | Temp 98.2°F | Resp 18 | Ht 71.5 in | Wt 208.0 lb

## 2013-06-22 DIAGNOSIS — K2970 Gastritis, unspecified, without bleeding: Secondary | ICD-10-CM

## 2013-06-22 DIAGNOSIS — K2990 Gastroduodenitis, unspecified, without bleeding: Secondary | ICD-10-CM

## 2013-06-22 DIAGNOSIS — K5900 Constipation, unspecified: Secondary | ICD-10-CM

## 2013-06-22 LAB — POCT URINALYSIS DIPSTICK
Blood, UA: NEGATIVE
Glucose, UA: NEGATIVE
Nitrite, UA: NEGATIVE
Spec Grav, UA: 1.02
Urobilinogen, UA: 1
pH, UA: 7

## 2013-06-22 LAB — POCT UA - MICROSCOPIC ONLY
Bacteria, U Microscopic: NEGATIVE
Crystals, Ur, HPF, POC: NEGATIVE
Epithelial cells, urine per micros: NEGATIVE
RBC, urine, microscopic: NEGATIVE

## 2013-06-22 LAB — POCT CBC
Lymph, poc: 3.7 — AB (ref 0.6–3.4)
MCH, POC: 27.2 pg (ref 27–31.2)
MCHC: 32.7 g/dL (ref 31.8–35.4)
MCV: 83.3 fL (ref 80–97)
MID (cbc): 0.8 (ref 0–0.9)
MPV: 12.4 fL (ref 0–99.8)
POC LYMPH PERCENT: 45.4 %L (ref 10–50)
POC MID %: 9.4 %M (ref 0–12)
Platelet Count, POC: 200 10*3/uL (ref 142–424)
RBC: 5.4 M/uL (ref 4.69–6.13)
WBC: 8.1 10*3/uL (ref 4.6–10.2)

## 2013-06-22 MED ORDER — POLYETHYLENE GLYCOL 3350 17 GM/SCOOP PO POWD: 17.0000 g | Freq: Every day | ORAL | Status: DC

## 2013-06-22 MED ORDER — OMEPRAZOLE 40 MG PO CPDR: 40.0000 mg | DELAYED_RELEASE_CAPSULE | Freq: Every day | ORAL | Status: DC

## 2013-06-22 NOTE — Progress Notes (Signed)
[facility] [address] [place] [address] [phone]  Date:  06/22/2013   Name:  Chad Spears   DOB:  Sep 07, 1981   MRN:  [id number]  PCP:  Sonda Primes, MD    Chief Complaint: Abdominal Pain   History of Present Illness:  Chad Spears is a 32 y.o. very pleasant male patient who presents with the following:  "it is paining in my tummy" points at both flanks.  Was previously diagnosed and treated with medication by Dr Alwyn Ren but has stopped the medication.  Doesn't remember the problem or the medication.  Says is constipated.  No fever or chills, no nausea or vomiting.  No stool change.  The patient has no complaint of blood, mucous, or pus in her stools. No cough or coryza.  Appetite is normal with no weight loss.    Patient Active Problem List   Diagnosis Date Noted  . Dysuria 01/28/2013  . Neck pain 11/20/2012  . Hematemesis 03/20/2012  . LBP (low back pain) 03/20/2012  . Erectile dysfunction 11/14/2011  . Neck mass 06/12/2011  . SARCOIDOSIS 10/16/2010  . HEPATITIS B CARRIER 10/16/2010  . HEPATITIS C CARRIER 10/16/2010  . HEPATITIS B, HX OF 10/16/2010  . SINUSITIS, MAXILLARY, CHRONIC 10/12/2010  . HEADACHE 09/28/2010  . LIPOMA, SKIN 08/13/2010  . ANXIETY 08/09/2010  . 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 INGUINAL LYMPHADENOPATHY, RIGHT 07/22/2010  . BACK PAIN, LUMBAR 07/11/2010  . LYMPHEDEMA, RIGHT LEG 06/26/2010  . INSOMNIA, CHRONIC 06/15/2010  . TUBERCULOSIS 06/11/2010  . NAUSEA AND VOMITING 05/07/2010  . DUODENITIS 03/21/2010  . Loss of weight 03/07/2010  . Heartburn 03/07/2010  . BRONCHITIS, ACUTE 02/12/2010  . TINEA PEDIS 09/07/2007  . Esophageal reflux 09/07/2007  . PLANTAR FASCIITIS, BILATERAL 09/07/2007    Past Medical History  Diagnosis Date  . Dermatophytosis of foot   . Esophageal reflux   . Plantar fascial fibromatosis   . TB (tuberculosis) 1998    [place], rx again in 2001 (of note he had evidence fo calcified lymph nodes  in abdomen as well as some duodenal inflammation)  . Hepatitis     B- SAB/ CAB +  . Duodenitis   . Lymphedema     in right leg  . IBS (irritable bowel syndrome)   . Psychosomatic disorder   . Anxiety   . Headache(784.0)   . ED (erectile dysfunction)     Past Surgical History  Procedure Laterality Date  . Right foot surgery      completed in [place]  . Right groin      ? TB infection surgery- remote completed in [place]  . Tonsillectomy    . Mass excision  2012    back of neck    History  Substance Use Topics  . Smoking status: Never Smoker   . Smokeless tobacco: Not on file  . Alcohol Use: No    History reviewed. No pertinent family history.  Allergies  Allergen Reactions  . Iohexol      Code: HIVES, Desc: PER SYSTEM NOTES/PCC PT IS ALLERGIC TO IV DYE 09/28/10/RM, Onset Date: [id number]     Medication list has been reviewed and updated.  Current Outpatient Prescriptions on File Prior to Visit  Medication Sig Dispense Refill  . cholecalciferol (VITAMIN D) 1000 UNITS tablet Take 1 tablet (1,000 Units total) by mouth daily.  100 tablet  11  . doxycycline (VIBRA-TABS) 100 MG tablet Take 1 tablet (100 mg total) by mouth 2 (two)  times daily.  28 tablet  0  . ibuprofen (ADVIL,MOTRIN) 800 MG tablet TAKE ONE TABLET BY MOUTH TWICE DAILY AS NEEDED FOR PAIN. TAKE  WITH  FOOD  60 tablet  2  . omeprazole (PRILOSEC) 40 MG capsule Take 1 capsule (40 mg total) by mouth daily.  90 capsule  3  . vardenafil (LEVITRA) 20 MG tablet Take 1 tablet (20 mg total) by mouth daily as needed for erectile dysfunction.  12 tablet  6   No current facility-administered medications on file prior to visit.    Review of Systems:  As per HPI, otherwise negative.     Physical Examination: Filed Vitals:   06/22/13 1044  BP: 118/80  Pulse: 65  Temp: 98.2 F (36.8 C)  Resp: 18   Filed Vitals:   06/22/13 1044  Height: 5' 11.5" (1.816 m)  Weight: 208 lb (94.348 kg)   Body mass index is 28.61  kg/(m^2). Ideal Body Weight: Weight in (lb) to have BMI = 25: 181.4  GEN: WDWN, NAD, Non-toxic, A & O x 3 HEENT: Atraumatic, Normocephalic. Neck supple. No masses, No LAD. Ears and Nose: No external deformity. CV: RRR, No M/G/R. No JVD. No thrill. No extra heart sounds. PULM: CTA B, no wheezes, crackles, rhonchi. No retractions. No resp. distress. No accessory muscle use. ABD: S, NT, ND, +BS. No rebound. No HSM. EXTR: No c/c/e NEURO Normal gait.  PSYCH: Normally interactive. Conversant. Not depressed or anxious appearing.  Calm demeanor.    Assessment and Plan: Abdominal pain  Constipation miralax   Signed,  Phillips Odor, MD  [facility] reading (PRIMARY) by  Dr. Dareen Piano  Increased stool  No obstruction.    Results for orders placed in visit on 06/22/13  POCT CBC      Result Value Range   WBC 8.1  4.6 - 10.2 K/uL   Lymph, poc 3.7 (*) 0.6 - 3.4   POC LYMPH PERCENT 45.4  10 - 50 %L   MID (cbc) 0.8  0 - 0.9   POC MID % 9.4  0 - 12 %M   POC Granulocyte 3.7  2 - 6.9   Granulocyte percent 45.2  37 - 80 %G   RBC 5.40  4.69 - 6.13 M/uL   Hemoglobin 14.7  14.1 - 18.1 g/dL   HCT, POC 16.1  09.6 - 53.7 %   MCV 83.3  80 - 97 fL   MCH, POC 27.2  27 - 31.2 pg   MCHC 32.7  31.8 - 35.4 g/dL   RDW, POC 04.5     Platelet Count, POC 200  142 - 424 K/uL   MPV 12.4  0 - 99.8 fL  POCT URINALYSIS DIPSTICK      Result Value Range   Color, UA yellow     Clarity, UA clear     Glucose, UA neg     Bilirubin, UA neg     Ketones, UA neg     Spec Grav, UA 1.020     Blood, UA neg     pH, UA 7.0     Protein, UA neg     Urobilinogen, UA 1.0     Nitrite, UA neg     Leukocytes, UA Negative    POCT UA - MICROSCOPIC ONLY      Result Value Range   WBC, Ur, HPF, POC 0.1     RBC, urine, microscopic neg     Bacteria, U Microscopic neg     Mucus, UA neg  Epithelial cells, urine per micros neg     Crystals, Ur, HPF, POC neg     Casts, Ur, LPF, POC neg     Yeast, UA neg

## 2013-06-22 NOTE — Patient Instructions (Addendum)

## 2013-06-25 ENCOUNTER — Ambulatory Visit (INDEPENDENT_AMBULATORY_CARE_PROVIDER_SITE_OTHER): Payer: BC Managed Care – PPO | Admitting: Internal Medicine

## 2013-06-25 ENCOUNTER — Encounter: Payer: Self-pay | Admitting: Internal Medicine

## 2013-06-25 VITALS — BP 110/70 | HR 75 | Temp 97.9°F | Wt 207.0 lb

## 2013-06-25 DIAGNOSIS — M545 Low back pain: Secondary | ICD-10-CM

## 2013-06-25 DIAGNOSIS — K5909 Other constipation: Secondary | ICD-10-CM

## 2013-06-25 DIAGNOSIS — R634 Abnormal weight loss: Secondary | ICD-10-CM

## 2013-06-25 DIAGNOSIS — K5904 Chronic idiopathic constipation: Secondary | ICD-10-CM

## 2013-06-25 DIAGNOSIS — R112 Nausea with vomiting, unspecified: Secondary | ICD-10-CM

## 2013-06-25 MED ORDER — TADALAFIL 20 MG PO TABS: 20.0000 mg | ORAL_TABLET | Freq: Every day | ORAL | Status: DC | PRN

## 2013-06-25 NOTE — Progress Notes (Signed)
   Subjective:    HPI  No abd or chest pain; he is tired. Occ HAs He is attending [employer] now He is a poor historian... F/u LBP - it sounds like he had a fall at work. He was seen by Ortho and had an x-ray and an LS spine MRI. Pain is better a little...  Review of Systems  Constitutional: Positive for fatigue. Negative for diaphoresis, appetite change and unexpected weight change.  HENT: Negative for nosebleeds, congestion, sore throat and sneezing.   Eyes: Negative for itching and visual disturbance.  Respiratory: Negative for cough.   Cardiovascular: Negative for palpitations and leg swelling.  Gastrointestinal: Negative for diarrhea, blood in stool and abdominal distention.  Musculoskeletal: Negative for joint swelling and gait problem.  Skin: Negative for pallor and rash.  Neurological: Negative for dizziness, tremors and speech difficulty.  Psychiatric/Behavioral: Negative for suicidal ideas, behavioral problems, sleep disturbance, self-injury, dysphoric mood and agitation. The patient is nervous/anxious.        Objective:   Physical Exam  Constitutional: He is oriented to person, place, and time. He appears well-developed.  HENT:  Mouth/Throat: Oropharynx is clear and moist.  Eyes: Conjunctivae are normal. Pupils are equal, round, and reactive to light.  Neck: Normal range of motion. No JVD present. No thyromegaly present.  Cardiovascular: Normal rate, regular rhythm, normal heart sounds and intact distal pulses.  Exam reveals no gallop and no friction rub.   No murmur heard. Pulmonary/Chest: Effort normal and breath sounds normal. No respiratory distress. He has no wheezes. He has no rales. He exhibits no tenderness.  Abdominal: Soft. Bowel sounds are normal. He exhibits no distension and no mass. There is no tenderness. There is no rebound and no guarding.  Musculoskeletal: Normal range of motion. He exhibits no edema and no tenderness.  LS is sensitive w/ROM   Lymphadenopathy:    He has no cervical adenopathy.  Neurological: He is alert and oriented to person, place, and time. He has normal reflexes. No cranial nerve deficit. He exhibits normal muscle tone. Coordination normal.  Skin: Skin is warm and dry. No rash noted.  Psychiatric: He has a normal mood and affect. His behavior is normal. Judgment and thought content normal.    Lab Results  Component Value Date   WBC 8.1 06/22/2013   HGB 14.7 06/22/2013   HCT 45.0 06/22/2013   PLT 218.0 12/31/2011   GLUCOSE 88 11/20/2012   CHOL 199 03/05/2010   TRIG 68.0 03/05/2010   HDL 39.50 03/05/2010   LDLCALC 146* 03/05/2010   ALT 29 11/20/2012   AST 25 11/20/2012   NA 142 11/20/2012   K 3.8 11/20/2012   CL 109 11/20/2012   CREATININE 0.9 11/20/2012   BUN 15 11/20/2012   CO2 25 11/20/2012   TSH 1.26 11/20/2012   INR 1.08 08/23/2010         Assessment & Plan:

## 2013-06-25 NOTE — Assessment & Plan Note (Signed)
Gluten free trial (no wheat products) x4-6 weeks. OK to use Gluten-free bread and pasta.  Milk free trial (no milk, ice cream, cheese and yogurt) x4 weeks. OK to use almond, coconut, rice or soy milk.  Miralax prn

## 2013-06-25 NOTE — Patient Instructions (Addendum)
Gluten free trial (no wheat products) x4-6 weeks. OK to use Gluten-free bread and pasta.  Milk free trial (no milk, ice cream, cheese and yogurt) x4 weeks. OK to use almond, coconut, rice or soy milk.  

## 2013-06-25 NOTE — Assessment & Plan Note (Signed)
Gluten free trial (no wheat products) x4-6 weeks. OK to use Gluten-free bread and pasta.  Milk free trial (no milk, ice cream, cheese and yogurt) x4 weeks. OK to use almond, coconut, rice or soy milk.

## 2013-06-25 NOTE — Assessment & Plan Note (Signed)
Wt Readings from Last 3 Encounters:  06/25/13 207 lb (93.895 kg)  06/22/13 208 lb (94.348 kg)  04/02/13 204 lb (92.534 kg)

## 2013-06-25 NOTE — Assessment & Plan Note (Signed)
recurrent

## 2013-07-08 ENCOUNTER — Ambulatory Visit: Payer: BC Managed Care – PPO | Admitting: Internal Medicine

## 2013-09-18 ENCOUNTER — Other Ambulatory Visit: Payer: Self-pay | Admitting: Internal Medicine

## 2013-10-28 ENCOUNTER — Ambulatory Visit (INDEPENDENT_AMBULATORY_CARE_PROVIDER_SITE_OTHER): Payer: BC Managed Care – PPO | Admitting: Internal Medicine

## 2013-10-28 ENCOUNTER — Encounter: Payer: Self-pay | Admitting: Internal Medicine

## 2013-10-28 VITALS — BP 100/62 | HR 80 | Temp 97.3°F | Resp 16 | Wt 205.0 lb

## 2013-10-28 DIAGNOSIS — F411 Generalized anxiety disorder: Secondary | ICD-10-CM

## 2013-10-28 DIAGNOSIS — Z23 Encounter for immunization: Secondary | ICD-10-CM

## 2013-10-28 DIAGNOSIS — N529 Male erectile dysfunction, unspecified: Secondary | ICD-10-CM

## 2013-10-28 DIAGNOSIS — K5904 Chronic idiopathic constipation: Secondary | ICD-10-CM

## 2013-10-28 DIAGNOSIS — T2220XA Burn of second degree of shoulder and upper limb, except wrist and hand, unspecified site, initial encounter: Secondary | ICD-10-CM

## 2013-10-28 DIAGNOSIS — K5909 Other constipation: Secondary | ICD-10-CM

## 2013-10-28 DIAGNOSIS — R634 Abnormal weight loss: Secondary | ICD-10-CM

## 2013-10-28 MED ORDER — OMEPRAZOLE 40 MG PO CPDR: 40.0000 mg | DELAYED_RELEASE_CAPSULE | Freq: Every day | ORAL | Status: DC

## 2013-10-28 MED ORDER — VARDENAFIL HCL 20 MG PO TABS: 20.0000 mg | ORAL_TABLET | Freq: Every day | ORAL | Status: DC | PRN

## 2013-10-28 NOTE — Assessment & Plan Note (Signed)
Dressed the wound

## 2013-10-28 NOTE — Assessment & Plan Note (Addendum)
levitra prn 

## 2013-10-28 NOTE — Assessment & Plan Note (Signed)
Wt Readings from Last 3 Encounters:  10/28/13 205 lb (92.987 kg)  06/25/13 207 lb (93.895 kg)  06/22/13 208 lb (94.348 kg)

## 2013-10-28 NOTE — Assessment & Plan Note (Signed)
Continue with current prescription therapy as reflected on the Med list.  

## 2013-10-28 NOTE — Progress Notes (Signed)
   Subjective:    HPI  C/o L forearm burn No abd or chest pain; he is tired. Occ HAs He is attending [employer] now He is a poor historian... F/u LBP - it sounds like he had a fall at work. He was seen by Ortho and had an x-ray and an LS spine MRI. Pain is better a little...  Review of Systems  Constitutional: Positive for fatigue. Negative for diaphoresis, appetite change and unexpected weight change.  HENT: Negative for congestion, nosebleeds, sneezing and sore throat.   Eyes: Negative for itching and visual disturbance.  Respiratory: Negative for cough.   Cardiovascular: Negative for palpitations and leg swelling.  Gastrointestinal: Negative for diarrhea, blood in stool and abdominal distention.  Musculoskeletal: Negative for gait problem and joint swelling.  Skin: Negative for pallor and rash.  Neurological: Negative for dizziness, tremors and speech difficulty.  Psychiatric/Behavioral: Negative for suicidal ideas, behavioral problems, sleep disturbance, self-injury, dysphoric mood and agitation. The patient is nervous/anxious.        Objective:   Physical Exam  Constitutional: He is oriented to person, place, and time. He appears well-developed.  HENT:  Mouth/Throat: Oropharynx is clear and moist.  Eyes: Conjunctivae are normal. Pupils are equal, round, and reactive to light.  Neck: Normal range of motion. No JVD present. No thyromegaly present.  Cardiovascular: Normal rate, regular rhythm, normal heart sounds and intact distal pulses.  Exam reveals no gallop and no friction rub.   No murmur heard. Pulmonary/Chest: Effort normal and breath sounds normal. No respiratory distress. He has no wheezes. He has no rales. He exhibits no tenderness.  Abdominal: Soft. Bowel sounds are normal. He exhibits no distension and no mass. There is no tenderness. There is no rebound and no guarding.  Musculoskeletal: Normal range of motion. He exhibits no edema and no tenderness.  LS is sensitive  w/ROM  Lymphadenopathy:    He has no cervical adenopathy.  Neurological: He is alert and oriented to person, place, and time. He has normal reflexes. No cranial nerve deficit. He exhibits normal muscle tone. Coordination normal.  Skin: Skin is warm and dry. No rash noted.  Psychiatric: He has a normal mood and affect. His behavior is normal. Judgment and thought content normal.  2 cm L forearm burn 2nd degree  Lab Results  Component Value Date   WBC 8.1 06/22/2013   HGB 14.7 06/22/2013   HCT 45.0 06/22/2013   PLT 218.0 12/31/2011   GLUCOSE 88 11/20/2012   CHOL 199 03/05/2010   TRIG 68.0 03/05/2010   HDL 39.50 03/05/2010   LDLCALC 146* 03/05/2010   ALT 29 11/20/2012   AST 25 11/20/2012   NA 142 11/20/2012   K 3.8 11/20/2012   CL 109 11/20/2012   CREATININE 0.9 11/20/2012   BUN 15 11/20/2012   CO2 25 11/20/2012   TSH 1.26 11/20/2012   INR 1.08 08/23/2010         Assessment & Plan:

## 2013-10-28 NOTE — Progress Notes (Signed)
Pre visit review using our clinic review tool, if applicable. No additional management support is needed unless otherwise documented below in the visit note. 

## 2013-10-28 NOTE — Assessment & Plan Note (Signed)
Discussed.

## 2013-12-07 ENCOUNTER — Other Ambulatory Visit: Payer: Self-pay | Admitting: *Deleted

## 2013-12-07 MED ORDER — IBUPROFEN 800 MG PO TABS
800.0000 mg | ORAL_TABLET | Freq: Two times a day (BID) | ORAL | Status: DC
Start: 2013-12-07 — End: 2014-04-17

## 2014-01-27 ENCOUNTER — Other Ambulatory Visit: Payer: Self-pay | Admitting: *Deleted

## 2014-01-27 ENCOUNTER — Ambulatory Visit (INDEPENDENT_AMBULATORY_CARE_PROVIDER_SITE_OTHER): Payer: BC Managed Care – PPO | Admitting: Internal Medicine

## 2014-01-27 ENCOUNTER — Encounter: Payer: Self-pay | Admitting: Internal Medicine

## 2014-01-27 VITALS — BP 110/82 | HR 72 | Temp 98.6°F | Resp 16 | Wt 213.0 lb

## 2014-01-27 DIAGNOSIS — M545 Low back pain, unspecified: Secondary | ICD-10-CM

## 2014-01-27 DIAGNOSIS — N529 Male erectile dysfunction, unspecified: Secondary | ICD-10-CM

## 2014-01-27 DIAGNOSIS — F411 Generalized anxiety disorder: Secondary | ICD-10-CM

## 2014-01-27 MED ORDER — OMEPRAZOLE 40 MG PO CPDR: 40.0000 mg | DELAYED_RELEASE_CAPSULE | Freq: Every day | ORAL | Status: DC

## 2014-01-27 NOTE — Progress Notes (Signed)
   Subjective:    HPI   No abd or chest pain; he is tired. Occ HAs He is attending [employer] now He is a poor historian... F/u LBP - it sounds like he had a fall at work. He was seen by Ortho and had an x-ray and an LS spine MRI. Pain is better a little...  Review of Systems  Constitutional: Positive for fatigue. Negative for diaphoresis, appetite change and unexpected weight change.  HENT: Negative for congestion, nosebleeds, sneezing and sore throat.   Eyes: Negative for itching and visual disturbance.  Respiratory: Negative for cough.   Cardiovascular: Negative for palpitations and leg swelling.  Gastrointestinal: Negative for diarrhea, blood in stool and abdominal distention.  Musculoskeletal: Negative for gait problem and joint swelling.  Skin: Negative for pallor and rash.  Neurological: Negative for dizziness, tremors and speech difficulty.  Psychiatric/Behavioral: Negative for suicidal ideas, behavioral problems, sleep disturbance, self-injury, dysphoric mood and agitation. The patient is nervous/anxious.        Objective:   Physical Exam  Constitutional: He is oriented to person, place, and time. He appears well-developed.  HENT:  Mouth/Throat: Oropharynx is clear and moist.  Eyes: Conjunctivae are normal. Pupils are equal, round, and reactive to light.  Neck: Normal range of motion. No JVD present. No thyromegaly present.  Cardiovascular: Normal rate, regular rhythm, normal heart sounds and intact distal pulses.  Exam reveals no gallop and no friction rub.   No murmur heard. Pulmonary/Chest: Effort normal and breath sounds normal. No respiratory distress. He has no wheezes. He has no rales. He exhibits no tenderness.  Abdominal: Soft. Bowel sounds are normal. He exhibits no distension and no mass. There is no tenderness. There is no rebound and no guarding.  Musculoskeletal: Normal range of motion. He exhibits no edema and no tenderness.  LS is sensitive w/ROM   Lymphadenopathy:    He has no cervical adenopathy.  Neurological: He is alert and oriented to person, place, and time. He has normal reflexes. No cranial nerve deficit. He exhibits normal muscle tone. Coordination normal.  Skin: Skin is warm and dry. No rash noted.  Psychiatric: He has a normal mood and affect. His behavior is normal. Judgment and thought content normal.    Lab Results  Component Value Date   WBC 8.1 06/22/2013   HGB 14.7 06/22/2013   HCT 45.0 06/22/2013   PLT 218.0 12/31/2011   GLUCOSE 88 11/20/2012   CHOL 199 03/05/2010   TRIG 68.0 03/05/2010   HDL 39.50 03/05/2010   LDLCALC 146* 03/05/2010   ALT 29 11/20/2012   AST 25 11/20/2012   NA 142 11/20/2012   K 3.8 11/20/2012   CL 109 11/20/2012   CREATININE 0.9 11/20/2012   BUN 15 11/20/2012   CO2 25 11/20/2012   TSH 1.26 11/20/2012   INR 1.08 08/23/2010         Assessment & Plan:

## 2014-01-27 NOTE — Assessment & Plan Note (Signed)
Doing better.   

## 2014-01-27 NOTE — Progress Notes (Signed)
Pre visit review using our clinic review tool, if applicable. No additional management support is needed unless otherwise documented below in the visit note. 

## 2014-01-27 NOTE — Assessment & Plan Note (Signed)
Ibuprofen prn 

## 2014-01-27 NOTE — Assessment & Plan Note (Signed)
Cialis 5 mg prn

## 2014-04-17 ENCOUNTER — Encounter (HOSPITAL_COMMUNITY): Payer: Self-pay | Admitting: Emergency Medicine

## 2014-04-17 ENCOUNTER — Emergency Department (HOSPITAL_COMMUNITY)
Admission: EM | Admit: 2014-04-17 | Discharge: 2014-04-17 | Disposition: A | Discharge status: Home / Self Care | Payer: BC Managed Care – PPO | Attending: Emergency Medicine | Admitting: Emergency Medicine

## 2014-04-17 DIAGNOSIS — Y929 Unspecified place or not applicable: Secondary | ICD-10-CM | POA: Insufficient documentation

## 2014-04-17 DIAGNOSIS — Y9389 Activity, other specified: Secondary | ICD-10-CM | POA: Insufficient documentation

## 2014-04-17 DIAGNOSIS — Z8619 Personal history of other infectious and parasitic diseases: Secondary | ICD-10-CM | POA: Insufficient documentation

## 2014-04-17 DIAGNOSIS — K219 Gastro-esophageal reflux disease without esophagitis: Secondary | ICD-10-CM | POA: Insufficient documentation

## 2014-04-17 DIAGNOSIS — Z8719 Personal history of other diseases of the digestive system: Secondary | ICD-10-CM | POA: Insufficient documentation

## 2014-04-17 DIAGNOSIS — Z23 Encounter for immunization: Secondary | ICD-10-CM | POA: Insufficient documentation

## 2014-04-17 DIAGNOSIS — X58XXXA Exposure to other specified factors, initial encounter: Secondary | ICD-10-CM | POA: Insufficient documentation

## 2014-04-17 DIAGNOSIS — S058X9A Other injuries of unspecified eye and orbit, initial encounter: Secondary | ICD-10-CM | POA: Insufficient documentation

## 2014-04-17 DIAGNOSIS — Z8611 Personal history of tuberculosis: Secondary | ICD-10-CM | POA: Insufficient documentation

## 2014-04-17 DIAGNOSIS — S0501XA Injury of conjunctiva and corneal abrasion without foreign body, right eye, initial encounter: Secondary | ICD-10-CM

## 2014-04-17 DIAGNOSIS — Z8739 Personal history of other diseases of the musculoskeletal system and connective tissue: Secondary | ICD-10-CM | POA: Insufficient documentation

## 2014-04-17 DIAGNOSIS — Z8659 Personal history of other mental and behavioral disorders: Secondary | ICD-10-CM | POA: Insufficient documentation

## 2014-04-17 DIAGNOSIS — Z79899 Other long term (current) drug therapy: Secondary | ICD-10-CM | POA: Insufficient documentation

## 2014-04-17 MED ORDER — CIPROFLOXACIN HCL 0.3 % OP SOLN
2.0000 [drp] | OPHTHALMIC | Status: DC
  Administered 2014-04-17: 2 [drp] via OPHTHALMIC
  Filled 2014-04-17: qty 2.5

## 2014-04-17 MED ORDER — TETRACAINE HCL 0.5 % OP SOLN
2.0000 [drp] | Freq: Once | OPHTHALMIC | Status: AC
  Administered 2014-04-17: 2 [drp] via OPHTHALMIC
  Filled 2014-04-17: qty 2

## 2014-04-17 MED ORDER — FLUORESCEIN SODIUM 1 MG OP STRP
1.0000 | ORAL_STRIP | Freq: Once | OPHTHALMIC | Status: AC
  Administered 2014-04-17: 1 via OPHTHALMIC
  Filled 2014-04-17: qty 1

## 2014-04-17 MED ORDER — NAPROXEN 500 MG PO TABS: 500.0000 mg | ORAL_TABLET | Freq: Two times a day (BID) | ORAL | Status: DC

## 2014-04-17 MED ORDER — TETANUS-DIPHTH-ACELL PERTUSSIS 5-2.5-18.5 LF-MCG/0.5 IM SUSP
0.5000 mL | Freq: Once | INTRAMUSCULAR | Status: AC
  Administered 2014-04-17: 0.5 mL via INTRAMUSCULAR
  Filled 2014-04-17: qty 0.5

## 2014-04-17 NOTE — Discharge Instructions (Signed)
Please apply eye drop antibiotic 2 drops to right eye every 4 hrs while awake for the next 7 days as treatment of your corneal abrasion. Take pain medication as needed.  Follow up with eye specialist if your symptoms worsen.    Corneal Abrasion The cornea is the clear covering at the front and center of the eye. When looking at the colored portion of the eye (iris), you are looking through the cornea. This very thin tissue is made up of many layers. The surface layer is a single layer of cells (corneal epithelium) and is one of the most sensitive tissues in the body. If a scratch or injury causes the corneal epithelium to come off, it is called a corneal abrasion. If the injury extends to the tissues below the epithelium, the condition is called a corneal ulcer. CAUSES   Scratches.  Trauma.  Foreign body in the eye. Some people have recurrences of abrasions in the area of the original injury even after it has healed (recurrent erosion syndrome). Recurrent erosion syndrome generally improves and goes away with time. SYMPTOMS   Eye pain.  Difficulty or inability to keep the injured eye open.  The eye becomes very sensitive to light.  Recurrent erosions tend to happen suddenly, first thing in the morning, usually after waking up and opening the eye. DIAGNOSIS  Your health care provider can diagnose a corneal abrasion during an eye exam. Dye is usually placed in the eye using a drop or a small paper strip moistened by your tears. When the eye is examined with a special light, the abrasion shows up clearly because of the dye. TREATMENT   Small abrasions may be treated with antibiotic drops or ointment alone.  Usually a pressure patch is specially applied. Pressure patches prevent the eye from blinking, allowing the corneal epithelium to heal. A pressure patch also reduces the amount of pain present in the eye during healing. Most corneal abrasions heal within 2 3 days with no effect on  vision. If the abrasion becomes infected and spreads to the deeper tissues of the cornea, a corneal ulcer can result. This is serious because it can cause corneal scarring. Corneal scars interfere with light passing through the cornea and cause a loss of vision in the involved eye. HOME CARE INSTRUCTIONS  Use medicine or ointment as directed. Only take over-the-counter or prescription medicines for pain, discomfort, or fever as directed by your health care provider.  Do not drive or operate machinery while your eye is patched. Your ability to judge distances is impaired.  If your health care provider has given you a follow-up appointment, it is very important to keep that appointment. Not keeping the appointment could result in a severe eye infection or permanent loss of vision. If there is any problem keeping the appointment, let your health care provider know. SEEK MEDICAL CARE IF:   You have pain, light sensitivity, and a scratchy feeling in one eye or both eyes.  Your pressure patch keeps loosening up, and you can blink your eye under the patch after treatment.  Any kind of discharge develops from the eye after treatment or if the lids stick together in the morning.  You have the same symptoms in the morning as you did with the original abrasion days, weeks, or months after the abrasion healed. MAKE SURE YOU:   Understand these instructions.  Will watch your condition.  Will get help right away if you are not doing well or get worse.  Document Released: 10/25/2000 Document Revised: 08/18/2013 Document Reviewed: 07/05/2013 [facility] Patient Information 2014 [place].

## 2014-04-17 NOTE — ED Notes (Signed)
Pt. presents with right eye redness/pain/drainage injured last night by his child while playing . No blurred vision .

## 2014-04-17 NOTE — ED Provider Notes (Signed)
Medical screening examination/treatment/procedure(s) were performed by non-physician practitioner and as supervising physician I was immediately available for consultation/collaboration.   EKG Interpretation None        Mariea Clonts, MD 04/17/14 [phone]

## 2014-04-17 NOTE — ED Provider Notes (Signed)
CSN: [id number]     Arrival date & time 04/17/14  0543 History   First MD Initiated Contact with Patient 04/17/14 [phone]     Chief Complaint  Patient presents with  . Eye Injury     (Consider location/radiation/quality/duration/timing/severity/associated sxs/prior Treatment) HPI  33 year old male with history of hepatitis C, and tuberculosis, psychosomatic disorder who presents for evaluation of eye injury. Patient reports he was playing with his 66-year-old son last night when his son accidentally poked him in his right eye. He reports acute onset of sharp pain to right eye and subsequently having a foreign body sensation. States it became red and tearful. Denies blurred vision, loss of vision, double vision, or inability to move eye to a certain direction.  He does not recall the last time that he had a tetanus shot. He does not wear contact lens. He does not have an eye Dr. He has no other complaints. No specific treatment tried. Denies any crust around his eye this morning.  Past Medical History  Diagnosis Date  . Dermatophytosis of foot   . Esophageal reflux   . Plantar fascial fibromatosis   . TB (tuberculosis) 1998    [place], rx again in 2001 (of note he had evidence fo calcified lymph nodes in abdomen as well as some duodenal inflammation)  . Hepatitis     B- SAB/ CAB +  . Duodenitis   . Lymphedema     in right leg  . IBS (irritable bowel syndrome)   . Psychosomatic disorder   . Anxiety   . Headache(784.0)   . ED (erectile dysfunction)    Past Surgical History  Procedure Laterality Date  . Right foot surgery      completed in [place]  . Right groin      ? TB infection surgery- remote completed in [place]  . Tonsillectomy    . Mass excision  2012    back of neck   No family history on file. History  Substance Use Topics  . Smoking status: Never Smoker   . Smokeless tobacco: Not on file  . Alcohol Use: No    Review of Systems  Constitutional: Negative for fever.    Eyes: Positive for pain, discharge and redness. Negative for photophobia and visual disturbance.      Allergies  Dye fdc red and Iohexol  Home Medications   Prior to Admission medications   Medication Sig Start Date End Date Taking? Authorizing Provider  cholecalciferol (VITAMIN D) 1000 UNITS tablet Take 1 tablet (1,000 Units total) by mouth daily. 12/31/12 12/31/13  Cassandria Anger, MD  ibuprofen (ADVIL,MOTRIN) 800 MG tablet Take 1 tablet (800 mg total) by mouth 2 (two) times daily with a meal. 12/07/13   Aleksei Plotnikov V, MD  omeprazole (PRILOSEC) 40 MG capsule Take 1 capsule (40 mg total) by mouth daily. 01/27/14   Aleksei Plotnikov V, MD  polyethylene glycol powder (GLYCOLAX/MIRALAX) powder Take 17 g by mouth daily. 06/22/13   Ellison Carwin, MD  vardenafil (LEVITRA) 20 MG tablet Take 1 tablet (20 mg total) by mouth daily as needed for erectile dysfunction. 10/28/13   Aleksei Plotnikov V, MD   BP 120/80  Pulse 74  Temp(Src) 98.1 F (36.7 C)  Resp 16  Ht 6' (1.829 m)  Wt 212 lb (96.163 kg)  BMI 28.75 kg/m2  SpO2 100% Physical Exam  Constitutional: He appears well-developed and well-nourished. No distress.  HENT:  Head: Atraumatic.  Eyes: EOM and lids are normal. Pupils are equal,  round, and reactive to light. Lids are everted and swept, no foreign bodies found. Right eye exhibits no chemosis, no discharge, no exudate and no hordeolum. No foreign body present in the right eye. Right conjunctiva is injected. Right conjunctiva has no hemorrhage. No scleral icterus.  Fundoscopic exam:      The right eye shows no exudate and no hemorrhage.  Slit lamp exam:      The right eye shows corneal abrasion and fluorescein uptake. The right eye shows no corneal flare, no corneal ulcer, no foreign body, no hyphema and no hypopyon.    Neck: Normal range of motion. Neck supple.  Neurological: He is alert.  Skin: No rash noted.  Psychiatric: He has a normal mood and affect.    ED  Course  Procedures (including critical care time)  7:00 AM Patient was accidentally poked in the right eye with a finger on his 64-year-old son yesterday. Patient does have evidence of a corneal abrasion, in the right eye overlying the lens. He has normal visual acuity. Negative Seidel sign to suggest global injury. We'll discharge with antibiotic, pain medication, and patient will follow up with a specialist for further management.  Labs Review Labs Reviewed - No data to display  Imaging Review No results found.   EKG Interpretation None      MDM   Final diagnoses:  Corneal abrasion, right    BP 120/80  Pulse 74  Temp(Src) 98.1 F (36.7 C)  Resp 16  Ht 6' (1.829 m)  Wt 212 lb (96.163 kg)  BMI 28.75 kg/m2  SpO2 100%     Domenic Moras, PA-C 04/17/14 [phone]

## 2014-04-17 NOTE — ED Notes (Signed)
Both eyes uncorrected 20/13 Right eye uncorrected  20/20 Left    Eye uncorrected 20/10

## 2014-05-05 ENCOUNTER — Encounter: Payer: Self-pay | Admitting: Internal Medicine

## 2014-05-05 ENCOUNTER — Ambulatory Visit (INDEPENDENT_AMBULATORY_CARE_PROVIDER_SITE_OTHER): Payer: BC Managed Care – PPO | Admitting: Internal Medicine

## 2014-05-05 VITALS — BP 120/68 | HR 72 | Temp 98.2°F | Resp 16 | Wt 206.0 lb

## 2014-05-05 DIAGNOSIS — N529 Male erectile dysfunction, unspecified: Secondary | ICD-10-CM

## 2014-05-05 DIAGNOSIS — M545 Low back pain, unspecified: Secondary | ICD-10-CM

## 2014-05-05 DIAGNOSIS — F411 Generalized anxiety disorder: Secondary | ICD-10-CM

## 2014-05-05 NOTE — Assessment & Plan Note (Signed)
Continue with current prn prescription therapy as reflected on the Med list.  

## 2014-05-05 NOTE — Assessment & Plan Note (Signed)
Discussed.

## 2014-05-05 NOTE — Progress Notes (Signed)
   Subjective:    HPI   No abd or chest pain; he is tired. Occ HAs. C/o ED He is attending [employer] now He is a poor historian... F/u LBP - it sounds like he had a fall at work. He was seen by Ortho and had an x-ray and an LS spine MRI. Pain is better a little...  Review of Systems  Constitutional: Positive for fatigue. Negative for diaphoresis, appetite change and unexpected weight change.  HENT: Negative for congestion, nosebleeds, sneezing and sore throat.   Eyes: Negative for itching and visual disturbance.  Respiratory: Negative for cough.   Cardiovascular: Negative for palpitations and leg swelling.  Gastrointestinal: Negative for diarrhea, blood in stool and abdominal distention.  Musculoskeletal: Negative for gait problem and joint swelling.  Skin: Negative for pallor and rash.  Neurological: Negative for dizziness, tremors and speech difficulty.  Psychiatric/Behavioral: Negative for suicidal ideas, behavioral problems, sleep disturbance, self-injury, dysphoric mood and agitation. The patient is nervous/anxious.        Objective:   Physical Exam  Constitutional: He is oriented to person, place, and time. He appears well-developed.  HENT:  Mouth/Throat: Oropharynx is clear and moist.  Eyes: Conjunctivae are normal. Pupils are equal, round, and reactive to light.  Neck: Normal range of motion. No JVD present. No thyromegaly present.  Cardiovascular: Normal rate, regular rhythm, normal heart sounds and intact distal pulses.  Exam reveals no gallop and no friction rub.   No murmur heard. Pulmonary/Chest: Effort normal and breath sounds normal. No respiratory distress. He has no wheezes. He has no rales. He exhibits no tenderness.  Abdominal: Soft. Bowel sounds are normal. He exhibits no distension and no mass. There is no tenderness. There is no rebound and no guarding.  Musculoskeletal: Normal range of motion. He exhibits no edema and no tenderness.  LS is sensitive w/ROM   Lymphadenopathy:    He has no cervical adenopathy.  Neurological: He is alert and oriented to person, place, and time. He has normal reflexes. No cranial nerve deficit. He exhibits normal muscle tone. Coordination normal.  Skin: Skin is warm and dry. No rash noted.  Psychiatric: He has a normal mood and affect. His behavior is normal. Judgment and thought content normal.    Lab Results  Component Value Date   WBC 8.1 06/22/2013   HGB 14.7 06/22/2013   HCT 45.0 06/22/2013   PLT 218.0 12/31/2011   GLUCOSE 88 11/20/2012   CHOL 199 03/05/2010   TRIG 68.0 03/05/2010   HDL 39.50 03/05/2010   LDLCALC 146* 03/05/2010   ALT 29 11/20/2012   AST 25 11/20/2012   NA 142 11/20/2012   K 3.8 11/20/2012   CL 109 11/20/2012   CREATININE 0.9 11/20/2012   BUN 15 11/20/2012   CO2 25 11/20/2012   TSH 1.26 11/20/2012   INR 1.08 08/23/2010         Assessment & Plan:

## 2014-05-05 NOTE — Assessment & Plan Note (Signed)
Continue with current OTC therapy as reflected on the Med list.  

## 2014-05-07 ENCOUNTER — Encounter: Payer: Self-pay | Admitting: Internal Medicine

## 2014-06-02 ENCOUNTER — Ambulatory Visit (INDEPENDENT_AMBULATORY_CARE_PROVIDER_SITE_OTHER): Payer: BC Managed Care – PPO | Admitting: Internal Medicine

## 2014-06-02 ENCOUNTER — Encounter: Payer: Self-pay | Admitting: Internal Medicine

## 2014-06-02 VITALS — BP 114/64 | HR 65 | Temp 98.7°F | Resp 16 | Ht 72.0 in | Wt 213.0 lb

## 2014-06-02 DIAGNOSIS — K2970 Gastritis, unspecified, without bleeding: Secondary | ICD-10-CM | POA: Insufficient documentation

## 2014-06-02 DIAGNOSIS — K2990 Gastroduodenitis, unspecified, without bleeding: Secondary | ICD-10-CM

## 2014-06-02 MED ORDER — VITAMIN D 1000 UNITS PO TABS: 1000.0000 [IU] | ORAL_TABLET | Freq: Every day | ORAL | Status: DC

## 2014-06-02 MED ORDER — B COMPLEX PO TABS: 1.0000 | ORAL_TABLET | Freq: Every day | ORAL | Status: DC

## 2014-06-02 NOTE — Assessment & Plan Note (Signed)
7/15 acute Doing better

## 2014-06-02 NOTE — Progress Notes (Signed)
Pre visit review using our clinic review tool, if applicable. No additional management support is needed unless otherwise documented below in the visit note. 

## 2014-06-02 NOTE — Progress Notes (Signed)
   Subjective:    HPI  Chad Spears was sick x 4 days with n/v - better now... No diarrhea  No abd or chest pain; he is tired. Occ HAs. C/o ED He is attending [employer] now He is a poor historian... F/u LBP - it sounds like he had a fall at work. He was seen by Ortho and had an x-ray and an LS spine MRI. Pain is better a little...  Review of Systems  Constitutional: Positive for fatigue. Negative for diaphoresis, appetite change and unexpected weight change.  HENT: Negative for congestion, nosebleeds, sneezing and sore throat.   Eyes: Negative for itching and visual disturbance.  Respiratory: Negative for cough.   Cardiovascular: Negative for palpitations and leg swelling.  Gastrointestinal: Negative for diarrhea, blood in stool and abdominal distention.  Musculoskeletal: Negative for gait problem and joint swelling.  Skin: Negative for pallor and rash.  Neurological: Negative for dizziness, tremors and speech difficulty.  Psychiatric/Behavioral: Negative for suicidal ideas, behavioral problems, sleep disturbance, self-injury, dysphoric mood and agitation. The patient is nervous/anxious.        Objective:   Physical Exam  Constitutional: He is oriented to person, place, and time. He appears well-developed.  HENT:  Mouth/Throat: Oropharynx is clear and moist.  Eyes: Conjunctivae are normal. Pupils are equal, round, and reactive to light.  Neck: Normal range of motion. No JVD present. No thyromegaly present.  Cardiovascular: Normal rate, regular rhythm, normal heart sounds and intact distal pulses.  Exam reveals no gallop and no friction rub.   No murmur heard. Pulmonary/Chest: Effort normal and breath sounds normal. No respiratory distress. He has no wheezes. He has no rales. He exhibits no tenderness.  Abdominal: Soft. Bowel sounds are normal. He exhibits no distension and no mass. There is no tenderness. There is no rebound and no guarding.  Musculoskeletal: Normal range of motion. He  exhibits no edema and no tenderness.  LS is sensitive w/ROM  Lymphadenopathy:    He has no cervical adenopathy.  Neurological: He is alert and oriented to person, place, and time. He has normal reflexes. No cranial nerve deficit. He exhibits normal muscle tone. Coordination normal.  Skin: Skin is warm and dry. No rash noted.  Psychiatric: He has a normal mood and affect. His behavior is normal. Judgment and thought content normal.    Lab Results  Component Value Date   WBC 8.1 06/22/2013   HGB 14.7 06/22/2013   HCT 45.0 06/22/2013   PLT 218.0 12/31/2011   GLUCOSE 88 11/20/2012   CHOL 199 03/05/2010   TRIG 68.0 03/05/2010   HDL 39.50 03/05/2010   LDLCALC 146* 03/05/2010   ALT 29 11/20/2012   AST 25 11/20/2012   NA 142 11/20/2012   K 3.8 11/20/2012   CL 109 11/20/2012   CREATININE 0.9 11/20/2012   BUN 15 11/20/2012   CO2 25 11/20/2012   TSH 1.26 11/20/2012   INR 1.08 08/23/2010         Assessment & Plan:

## 2014-07-27 ENCOUNTER — Encounter: Payer: Self-pay | Admitting: Internal Medicine

## 2014-07-27 ENCOUNTER — Ambulatory Visit (INDEPENDENT_AMBULATORY_CARE_PROVIDER_SITE_OTHER): Payer: BC Managed Care – PPO | Admitting: Internal Medicine

## 2014-07-27 VITALS — BP 118/80 | HR 63 | Temp 98.3°F | Wt 220.0 lb

## 2014-07-27 DIAGNOSIS — M545 Low back pain, unspecified: Secondary | ICD-10-CM

## 2014-07-27 DIAGNOSIS — N529 Male erectile dysfunction, unspecified: Secondary | ICD-10-CM

## 2014-07-27 DIAGNOSIS — K219 Gastro-esophageal reflux disease without esophagitis: Secondary | ICD-10-CM

## 2014-07-27 DIAGNOSIS — F411 Generalized anxiety disorder: Secondary | ICD-10-CM

## 2014-07-27 DIAGNOSIS — N528 Other male erectile dysfunction: Secondary | ICD-10-CM

## 2014-07-27 DIAGNOSIS — Z23 Encounter for immunization: Secondary | ICD-10-CM

## 2014-07-27 DIAGNOSIS — K2970 Gastritis, unspecified, without bleeding: Secondary | ICD-10-CM

## 2014-07-27 DIAGNOSIS — K2990 Gastroduodenitis, unspecified, without bleeding: Secondary | ICD-10-CM

## 2014-07-27 MED ORDER — SILDENAFIL CITRATE 100 MG PO TABS: 50.0000 mg | ORAL_TABLET | ORAL | Status: DC | PRN

## 2014-07-27 NOTE — Assessment & Plan Note (Signed)
Discussed.

## 2014-07-27 NOTE — Assessment & Plan Note (Signed)
Continue with current  therapy as reflected on the Med list.

## 2014-07-27 NOTE — Assessment & Plan Note (Signed)
Continue with current prescription prn therapy as reflected on the Med list.  

## 2014-07-27 NOTE — Assessment & Plan Note (Signed)
Chronic. 

## 2014-07-27 NOTE — Progress Notes (Signed)
Pre visit review using our clinic review tool, if applicable. No additional management support is needed unless otherwise documented below in the visit note. 

## 2014-07-27 NOTE — Assessment & Plan Note (Signed)
Continue with current prescription therapy as reflected on the Med list.  

## 2014-07-27 NOTE — Progress Notes (Signed)
   Subjective:    HPI  C/o aches, nausea sometimes  No abd or chest pain; he is tired. Occ HAs. C/o ED He is attending [employer] now He is a poor historian... F/u LBP - it sounds like he had a fall at work. He was seen by Ortho and had an x-ray and an LS spine MRI. Pain is better a little...  Review of Systems  Constitutional: Positive for fatigue. Negative for diaphoresis, appetite change and unexpected weight change.  HENT: Negative for congestion, nosebleeds, sneezing and sore throat.   Eyes: Negative for itching and visual disturbance.  Respiratory: Negative for cough.   Cardiovascular: Negative for palpitations and leg swelling.  Gastrointestinal: Negative for diarrhea, blood in stool and abdominal distention.  Musculoskeletal: Negative for gait problem and joint swelling.  Skin: Negative for pallor and rash.  Neurological: Negative for dizziness, tremors and speech difficulty.  Psychiatric/Behavioral: Negative for suicidal ideas, behavioral problems, sleep disturbance, self-injury, dysphoric mood and agitation. The patient is nervous/anxious.        Objective:   Physical Exam  Constitutional: He is oriented to person, place, and time. He appears well-developed.  HENT:  Mouth/Throat: Oropharynx is clear and moist.  Eyes: Conjunctivae are normal. Pupils are equal, round, and reactive to light.  Neck: Normal range of motion. No JVD present. No thyromegaly present.  Cardiovascular: Normal rate, regular rhythm, normal heart sounds and intact distal pulses.  Exam reveals no gallop and no friction rub.   No murmur heard. Pulmonary/Chest: Effort normal and breath sounds normal. No respiratory distress. He has no wheezes. He has no rales. He exhibits no tenderness.  Abdominal: Soft. Bowel sounds are normal. He exhibits no distension and no mass. There is no tenderness. There is no rebound and no guarding.  Musculoskeletal: Normal range of motion. He exhibits no edema and no tenderness.   LS is sensitive w/ROM  Lymphadenopathy:    He has no cervical adenopathy.  Neurological: He is alert and oriented to person, place, and time. He has normal reflexes. No cranial nerve deficit. He exhibits normal muscle tone. Coordination normal.  Skin: Skin is warm and dry. No rash noted.  Psychiatric: He has a normal mood and affect. His behavior is normal. Judgment and thought content normal.    Lab Results  Component Value Date   WBC 8.1 06/22/2013   HGB 14.7 06/22/2013   HCT 45.0 06/22/2013   PLT 218.0 12/31/2011   GLUCOSE 88 11/20/2012   CHOL 199 03/05/2010   TRIG 68.0 03/05/2010   HDL 39.50 03/05/2010   LDLCALC 146* 03/05/2010   ALT 29 11/20/2012   AST 25 11/20/2012   NA 142 11/20/2012   K 3.8 11/20/2012   CL 109 11/20/2012   CREATININE 0.9 11/20/2012   BUN 15 11/20/2012   CO2 25 11/20/2012   TSH 1.26 11/20/2012   INR 1.08 08/23/2010         Assessment & Plan:

## 2014-09-06 ENCOUNTER — Other Ambulatory Visit: Payer: Self-pay | Admitting: Internal Medicine

## 2014-09-06 NOTE — Telephone Encounter (Signed)
Pt is requesting a refill of Ibuprofen 800 mg. The rx has been dc. Is it ok to refill? Please advise

## 2014-09-08 ENCOUNTER — Ambulatory Visit: Payer: BC Managed Care – PPO | Admitting: Internal Medicine

## 2014-09-08 ENCOUNTER — Ambulatory Visit (INDEPENDENT_AMBULATORY_CARE_PROVIDER_SITE_OTHER): Payer: BC Managed Care – PPO | Admitting: Internal Medicine

## 2014-09-08 ENCOUNTER — Encounter: Payer: Self-pay | Admitting: Internal Medicine

## 2014-09-08 VITALS — BP 114/80 | HR 64 | Temp 98.4°F | Wt 215.0 lb

## 2014-09-08 DIAGNOSIS — K5909 Other constipation: Secondary | ICD-10-CM

## 2014-09-08 DIAGNOSIS — N528 Other male erectile dysfunction: Secondary | ICD-10-CM

## 2014-09-08 DIAGNOSIS — R634 Abnormal weight loss: Secondary | ICD-10-CM

## 2014-09-08 DIAGNOSIS — K5904 Chronic idiopathic constipation: Secondary | ICD-10-CM

## 2014-09-08 DIAGNOSIS — F450 Somatization disorder: Secondary | ICD-10-CM

## 2014-09-08 MED ORDER — OMEPRAZOLE 40 MG PO CPDR: 40.0000 mg | DELAYED_RELEASE_CAPSULE | Freq: Every day | ORAL | Status: DC

## 2014-09-08 MED ORDER — TADALAFIL 20 MG PO TABS: 20.0000 mg | ORAL_TABLET | Freq: Every day | ORAL | Status: DC | PRN

## 2014-09-08 MED ORDER — IBUPROFEN 800 MG PO TABS: ORAL_TABLET | ORAL | Status: DC

## 2014-09-08 NOTE — Progress Notes (Signed)
Pre visit review using our clinic review tool, if applicable. No additional management support is needed unless otherwise documented below in the visit note. 

## 2014-09-08 NOTE — Assessment & Plan Note (Signed)
D/c Viagra Start Cialis prn

## 2014-09-08 NOTE — Assessment & Plan Note (Signed)
Discussed.

## 2014-09-08 NOTE — Assessment & Plan Note (Signed)
Wt Readings from Last 3 Encounters:  09/08/14 215 lb (97.523 kg)  07/27/14 220 lb (99.791 kg)  06/02/14 213 lb (96.616 kg)

## 2014-09-08 NOTE — Assessment & Plan Note (Signed)
Better  

## 2014-09-11 ENCOUNTER — Encounter: Payer: Self-pay | Admitting: Internal Medicine

## 2014-09-11 NOTE — Progress Notes (Signed)
   Subjective:    HPI  F/u arthralgias, LBP, ED  No abd or chest pain; he is tired. Occ HAs. He is attending [place] now He is a poor historian... F/u LBP - it sounds like he had a fall at work. He was seen by Ortho and had an x-ray and an LS spine MRI. Pain is better a little...  Review of Systems  Constitutional: Positive for fatigue. Negative for diaphoresis, appetite change and unexpected weight change.  HENT: Negative for congestion, nosebleeds, sneezing and sore throat.   Eyes: Negative for itching and visual disturbance.  Respiratory: Negative for cough.   Cardiovascular: Negative for palpitations and leg swelling.  Gastrointestinal: Negative for diarrhea, blood in stool and abdominal distention.  Musculoskeletal: Negative for joint swelling and gait problem.  Skin: Negative for pallor and rash.  Neurological: Negative for dizziness, tremors and speech difficulty.  Psychiatric/Behavioral: Negative for suicidal ideas, behavioral problems, sleep disturbance, self-injury, dysphoric mood and agitation. The patient is nervous/anxious.        Objective:   Physical Exam  Lab Results  Component Value Date   WBC 8.1 06/22/2013   HGB 14.7 06/22/2013   HCT 45.0 06/22/2013   PLT 218.0 12/31/2011   GLUCOSE 88 11/20/2012   CHOL 199 03/05/2010   TRIG 68.0 03/05/2010   HDL 39.50 03/05/2010   LDLCALC 146* 03/05/2010   ALT 29 11/20/2012   AST 25 11/20/2012   NA 142 11/20/2012   K 3.8 11/20/2012   CL 109 11/20/2012   CREATININE 0.9 11/20/2012   BUN 15 11/20/2012   CO2 25 11/20/2012   TSH 1.26 11/20/2012   INR 1.08 08/23/2010         Assessment & Plan:

## 2014-10-13 ENCOUNTER — Ambulatory Visit: Payer: BC Managed Care – PPO | Admitting: Internal Medicine

## 2014-10-24 ENCOUNTER — Telehealth: Payer: Self-pay | Admitting: Internal Medicine

## 2014-10-24 NOTE — Telephone Encounter (Signed)
Pt called stated he is trying to pick up tramadol but drug store stated that his [employer] is not going to pay for it. They advise pt to call our office to call [employer] [phone] to get it approve. Please call pt if this is ok

## 2014-11-02 NOTE — Telephone Encounter (Signed)
PA is Approved #120/30 days. Left detailed mess informing pt.

## 2014-11-03 ENCOUNTER — Ambulatory Visit: Payer: BC Managed Care – PPO | Admitting: Internal Medicine

## 2014-11-25 ENCOUNTER — Other Ambulatory Visit (INDEPENDENT_AMBULATORY_CARE_PROVIDER_SITE_OTHER): Payer: BLUE CROSS/BLUE SHIELD

## 2014-11-25 ENCOUNTER — Ambulatory Visit (INDEPENDENT_AMBULATORY_CARE_PROVIDER_SITE_OTHER): Payer: BLUE CROSS/BLUE SHIELD | Admitting: Internal Medicine

## 2014-11-25 ENCOUNTER — Encounter: Payer: Self-pay | Admitting: Internal Medicine

## 2014-11-25 VITALS — BP 100/70 | HR 65 | Temp 97.7°F | Wt 224.0 lb

## 2014-11-25 DIAGNOSIS — K2970 Gastritis, unspecified, without bleeding: Secondary | ICD-10-CM

## 2014-11-25 DIAGNOSIS — N528 Other male erectile dysfunction: Secondary | ICD-10-CM

## 2014-11-25 DIAGNOSIS — R112 Nausea with vomiting, unspecified: Secondary | ICD-10-CM

## 2014-11-25 DIAGNOSIS — M545 Low back pain: Secondary | ICD-10-CM

## 2014-11-25 LAB — BASIC METABOLIC PANEL
BUN: 19 mg/dL (ref 6–23)
CHLORIDE: 109 meq/L (ref 96–112)
CO2: 22 meq/L (ref 19–32)
Calcium: 9.4 mg/dL (ref 8.4–10.5)
Creatinine, Ser: 0.69 mg/dL (ref 0.40–1.50)
GFR: 166.86 mL/min (ref >60.00)
GLUCOSE: 104 mg/dL — AB (ref 70–99)
Potassium: 4.3 mEq/L (ref 3.5–5.1)
Sodium: 139 mEq/L (ref 135–145)

## 2014-11-25 LAB — CBC WITH DIFFERENTIAL/PLATELET
Basophils Absolute: 0.1 10*3/uL (ref 0.0–0.1)
Basophils Relative: 0.6 % (ref 0.0–3.0)
Eosinophils Absolute: 0.7 10*3/uL (ref 0.0–0.7)
Eosinophils Relative: 7.9 % — ABNORMAL HIGH (ref 0.0–5.0)
HCT: 45.5 % (ref 39.0–52.0)
Hemoglobin: 15 g/dL (ref 13.0–17.0)
Lymphocytes Relative: 31.3 % (ref 12.0–46.0)
Lymphs Abs: 2.8 10*3/uL (ref 0.7–4.0)
MCHC: 33 g/dL (ref 30.0–36.0)
MCV: 79.1 fl (ref 78.0–100.0)
Monocytes Absolute: 0.7 10*3/uL (ref 0.1–1.0)
Monocytes Relative: 7.7 % (ref 3.0–12.0)
Neutro Abs: 4.7 10*3/uL (ref 1.4–7.7)
Neutrophils Relative %: 52.5 % (ref 43.0–77.0)
Platelets: 235 10*3/uL (ref 150.0–400.0)
RBC: 5.75 Mil/uL (ref 4.22–5.81)
RDW: 14.8 % (ref 11.5–15.5)
WBC: 8.9 10*3/uL (ref 4.0–10.5)

## 2014-11-25 LAB — HEPATIC FUNCTION PANEL
ALBUMIN: 4.2 g/dL (ref 3.5–5.2)
ALK PHOS: 94 U/L (ref 39–117)
ALT: 25 U/L (ref 0–53)
AST: 22 U/L (ref 0–37)
Bilirubin, Direct: 0.1 mg/dL (ref 0.0–0.3)
Total Bilirubin: 0.3 mg/dL (ref 0.2–1.2)
Total Protein: 7.5 g/dL (ref 6.0–8.3)

## 2014-11-25 LAB — TSH: TSH: 3 u[IU]/mL (ref 0.35–4.50)

## 2014-11-25 MED ORDER — OMEPRAZOLE 40 MG PO CPDR: 40.0000 mg | DELAYED_RELEASE_CAPSULE | Freq: Every day | ORAL | Status: DC

## 2014-11-25 NOTE — Assessment & Plan Note (Signed)
Continue with current prescription therapy as reflected on the Med list.  

## 2014-11-25 NOTE — Assessment & Plan Note (Signed)
Doing well overall Prn Tylenol, Ibuprofen

## 2014-11-25 NOTE — Assessment & Plan Note (Signed)
No relapse 

## 2014-11-25 NOTE — Progress Notes (Signed)
Pre visit review using our clinic review tool, if applicable. No additional management support is needed unless otherwise documented below in the visit note. 

## 2015-01-05 ENCOUNTER — Ambulatory Visit: Payer: BC Managed Care – PPO | Admitting: Internal Medicine

## 2015-01-07 ENCOUNTER — Other Ambulatory Visit: Payer: Self-pay | Admitting: Internal Medicine

## 2015-01-17 ENCOUNTER — Other Ambulatory Visit: Payer: Self-pay | Admitting: Internal Medicine

## 2015-01-18 NOTE — Telephone Encounter (Signed)
Med is not on active list. Ok to Rf?

## 2015-03-30 ENCOUNTER — Encounter: Payer: Self-pay | Admitting: Internal Medicine

## 2015-03-30 ENCOUNTER — Ambulatory Visit (INDEPENDENT_AMBULATORY_CARE_PROVIDER_SITE_OTHER): Payer: BLUE CROSS/BLUE SHIELD | Admitting: Internal Medicine

## 2015-03-30 VITALS — BP 108/70 | HR 60 | Temp 98.1°F | Ht 72.0 in | Wt 220.0 lb

## 2015-03-30 DIAGNOSIS — F411 Generalized anxiety disorder: Secondary | ICD-10-CM | POA: Diagnosis not present

## 2015-03-30 DIAGNOSIS — K5909 Other constipation: Secondary | ICD-10-CM

## 2015-03-30 DIAGNOSIS — M545 Low back pain, unspecified: Secondary | ICD-10-CM

## 2015-03-30 DIAGNOSIS — K5904 Chronic idiopathic constipation: Secondary | ICD-10-CM

## 2015-03-30 NOTE — Progress Notes (Signed)
   Subjective:    HPI  F/u arthralgias, LBP, ED  No abd or chest pain; he is tired. Occ HAs. He is attending [employer] He is a poor historian... F/u LBP - it sounds like he had a fall at work. He was seen by Ortho and had an x-ray and an LS spine MRI. Pain is better a little...  Review of Systems  Constitutional: Positive for fatigue. Negative for diaphoresis, appetite change and unexpected weight change.  HENT: Negative for congestion, nosebleeds, sneezing and sore throat.   Eyes: Negative for itching and visual disturbance.  Respiratory: Negative for cough.   Cardiovascular: Negative for palpitations and leg swelling.  Gastrointestinal: Negative for diarrhea, blood in stool and abdominal distention.  Musculoskeletal: Negative for joint swelling and gait problem.  Skin: Negative for pallor and rash.  Neurological: Negative for dizziness, tremors and speech difficulty.  Psychiatric/Behavioral: Negative for suicidal ideas, behavioral problems, sleep disturbance, self-injury, dysphoric mood and agitation. The patient is nervous/anxious.        Objective:   Physical Exam  Constitutional: He is oriented to person, place, and time. He appears well-developed. No distress.  NAD  HENT:  Mouth/Throat: Oropharynx is clear and moist.  Eyes: Conjunctivae are normal. Pupils are equal, round, and reactive to light.  Neck: Normal range of motion. No JVD present. No thyromegaly present.  Cardiovascular: Normal rate, regular rhythm, normal heart sounds and intact distal pulses.  Exam reveals no gallop and no friction rub.   No murmur heard. Pulmonary/Chest: Effort normal and breath sounds normal. No respiratory distress. He has no wheezes. He has no rales. He exhibits no tenderness.  Abdominal: Soft. Bowel sounds are normal. He exhibits no distension and no mass. There is no tenderness. There is no rebound and no guarding.  Musculoskeletal: Normal range of motion. He exhibits no edema or tenderness.   Lymphadenopathy:    He has no cervical adenopathy.  Neurological: He is alert and oriented to person, place, and time. He has normal reflexes. No cranial nerve deficit. He exhibits normal muscle tone. He displays a negative Romberg sign. Coordination and gait normal.  Skin: Skin is warm and dry. No rash noted.  Psychiatric: He has a normal mood and affect. His behavior is normal. Judgment and thought content normal.    Lab Results  Component Value Date   WBC 8.9 11/25/2014   HGB 15.0 11/25/2014   HCT 45.5 11/25/2014   PLT 235.0 11/25/2014   GLUCOSE 104* 11/25/2014   CHOL 199 03/05/2010   TRIG 68.0 03/05/2010   HDL 39.50 03/05/2010   LDLCALC 146* 03/05/2010   ALT 25 11/25/2014   AST 22 11/25/2014   NA 139 11/25/2014   K 4.3 11/25/2014   CL 109 11/25/2014   CREATININE 0.69 11/25/2014   BUN 19 11/25/2014   CO2 22 11/25/2014   TSH 3.00 11/25/2014   INR 1.08 08/23/2010         Assessment & Plan:

## 2015-03-30 NOTE — Assessment & Plan Note (Signed)
Doing well 

## 2015-03-30 NOTE — Progress Notes (Signed)
Pre visit review using our clinic review tool, if applicable. No additional management support is needed unless otherwise documented below in the visit note. 

## 2015-03-30 NOTE — Assessment & Plan Note (Signed)
Ibuprofen

## 2015-04-03 ENCOUNTER — Other Ambulatory Visit: Payer: Self-pay | Admitting: Internal Medicine

## 2015-04-18 ENCOUNTER — Encounter: Payer: Self-pay | Admitting: Internal Medicine

## 2015-07-13 ENCOUNTER — Ambulatory Visit (INDEPENDENT_AMBULATORY_CARE_PROVIDER_SITE_OTHER): Payer: BLUE CROSS/BLUE SHIELD

## 2015-07-13 ENCOUNTER — Ambulatory Visit (INDEPENDENT_AMBULATORY_CARE_PROVIDER_SITE_OTHER): Payer: BLUE CROSS/BLUE SHIELD | Admitting: Family Medicine

## 2015-07-13 VITALS — BP 114/82 | HR 62 | Temp 98.1°F | Resp 18 | Ht 72.25 in | Wt 213.4 lb

## 2015-07-13 DIAGNOSIS — R1084 Generalized abdominal pain: Secondary | ICD-10-CM

## 2015-07-13 DIAGNOSIS — K5900 Constipation, unspecified: Secondary | ICD-10-CM

## 2015-07-13 LAB — COMPREHENSIVE METABOLIC PANEL
ALBUMIN: 4.5 g/dL (ref 3.6–5.1)
ALT: 27 U/L (ref 9–46)
AST: 22 U/L (ref 10–40)
Alkaline Phosphatase: 99 U/L (ref 40–115)
BILIRUBIN TOTAL: 0.6 mg/dL (ref 0.2–1.2)
BUN: 12 mg/dL (ref 7–25)
CO2: 25 mmol/L (ref 20–31)
CREATININE: 0.88 mg/dL (ref 0.60–1.35)
Calcium: 9.8 mg/dL (ref 8.6–10.3)
Chloride: 106 mmol/L (ref 98–110)
Glucose, Bld: 93 mg/dL (ref 65–99)
Potassium: 4.3 mmol/L (ref 3.5–5.3)
SODIUM: 139 mmol/L (ref 135–146)
TOTAL PROTEIN: 7.3 g/dL (ref 6.1–8.1)

## 2015-07-13 LAB — POCT CBC
GRANULOCYTE PERCENT: 51.7 % (ref 37–80)
HEMATOCRIT: 48.9 % (ref 43.5–53.7)
Hemoglobin: 16.2 g/dL (ref 14.1–18.1)
Lymph, poc: 3 (ref 0.6–3.4)
MCH, POC: 25.7 pg — AB (ref 27–31.2)
MCHC: 33.1 g/dL (ref 31.8–35.4)
MCV: 77.6 fL — AB (ref 80–97)
MID (cbc): 0.5 (ref 0–0.9)
MPV: 9.4 fL (ref 0–99.8)
POC GRANULOCYTE: 3.7 (ref 2–6.9)
POC LYMPH %: 42 % (ref 10–50)
POC MID %: 6.3 % (ref 0–12)
Platelet Count, POC: 235 10*3/uL (ref 142–424)
RBC: 6.31 M/uL — AB (ref 4.69–6.13)
RDW, POC: 14.3 %
WBC: 7.2 10*3/uL (ref 4.6–10.2)

## 2015-07-13 LAB — TSH: TSH: 1.568 u[IU]/mL (ref 0.350–4.500)

## 2015-07-13 LAB — LIPASE: LIPASE: 16 U/L (ref 7–60)

## 2015-07-13 NOTE — Progress Notes (Addendum)
Abdominal pain and constipation Subjective:  Patient ID: Chad Spears, male    DOB: [birth year]  Age: 34 y.o. MRN: [id number]  Patient is here with a history of having 2 weeks of abdominal pain. He's tried MiraLAX, some of the laxity of, prune juice, etc. without relief. He has had no nausea or vomiting. He has generalized abdominal pain and bloating. He has not had any major weight loss or weight gain. He does get some regular exercise. He works at [employer] and has not missed any work   Objective:   Overweight says [place] male in no major distress. His abdomen is been hurting but nonspecifically. Normal bowel sounds. A little bit distended generally. Mildly firm generally. He is tender down the right side of the abdomen.  Spot on x-ray from previous chest film 2 years ago noted. A three-month follow-up was recommended and apparently never done.  Assessment & Plan:   Assessment:  Abdominal pain Constipation Abnormal chest x-ray 2 years ago   Plan:  Labs, x-ray  Results for orders placed or performed in visit on 07/13/15  POCT CBC  Result Value Ref Range   WBC 7.2 4.6 - 10.2 K/uL   Lymph, poc 3.0 0.6 - 3.4   POC LYMPH PERCENT 42.0 10 - 50 %L   MID (cbc) 0.5 0 - 0.9   POC MID % 6.3 0 - 12 %M   POC Granulocyte 3.7 2 - 6.9   Granulocyte percent 51.7 37 - 80 %G   RBC 6.31 (A) 4.69 - 6.13 M/uL   Hemoglobin 16.2 14.1 - 18.1 g/dL   HCT, POC 48.9 43.5 - 53.7 %   MCV 77.6 (A) 80 - 97 fL   MCH, POC 25.7 (A) 27 - 31.2 pg   MCHC 33.1 31.8 - 35.4 g/dL   RDW, POC 14.3 %   Platelet Count, POC 235 142 - 424 K/uL   MPV 9.4 0 - 99.8 fL     Patient Instructions  Drink lots of water and juice  Purchased a bottle of magnesium citrate laxity of a drink the whole bottle. This usually will make your bowels loose. If it does not, you can repeat and take a second bottle the next day. If your bowel still do not move please let me know  Also you can continue taking the MiraLAX one dose  daily  If necessary in the future when your bowels do not move with the MiraLAX you can take 2-4 Dulcolax tablets.  I will let you know the results of your other labs in a few days  If you develop bad abdominal pain or fevers or passing any blood please return or go to the emergency room   [facility] reading (PRIMARY) by  Dr. Linna Darner Stool and gas in colon, otherwise normal. .    Shonette Rhames, MD 07/13/2015

## 2015-07-13 NOTE — Patient Instructions (Signed)
Drink lots of water and juice  Purchased a bottle of magnesium citrate laxity of a drink the whole bottle. This usually will make your bowels loose. If it does not, you can repeat and take a second bottle the next day. If your bowel still do not move please let me know  Also you can continue taking the MiraLAX one dose daily  If necessary in the future when your bowels do not move with the MiraLAX you can take 2-4 Dulcolax tablets.  I will let you know the results of your other labs in a few days  If you develop bad abdominal pain or fevers or passing any blood please return or go to the emergency room

## 2015-07-14 ENCOUNTER — Encounter: Payer: Self-pay | Admitting: Family Medicine

## 2015-07-18 ENCOUNTER — Telehealth: Payer: Self-pay

## 2015-07-18 NOTE — Telephone Encounter (Signed)
Pt called requesting lab results. Informed pt of lab results.

## 2015-08-10 ENCOUNTER — Ambulatory Visit (INDEPENDENT_AMBULATORY_CARE_PROVIDER_SITE_OTHER): Payer: BLUE CROSS/BLUE SHIELD | Admitting: Internal Medicine

## 2015-08-10 ENCOUNTER — Encounter: Payer: Self-pay | Admitting: Internal Medicine

## 2015-08-10 VITALS — BP 110/70 | HR 61 | Wt 215.0 lb

## 2015-08-10 DIAGNOSIS — K5909 Other constipation: Secondary | ICD-10-CM | POA: Diagnosis not present

## 2015-08-10 DIAGNOSIS — K5904 Chronic idiopathic constipation: Secondary | ICD-10-CM

## 2015-08-10 DIAGNOSIS — K589 Irritable bowel syndrome without diarrhea: Secondary | ICD-10-CM | POA: Diagnosis not present

## 2015-08-10 DIAGNOSIS — K219 Gastro-esophageal reflux disease without esophagitis: Secondary | ICD-10-CM

## 2015-08-10 DIAGNOSIS — Z23 Encounter for immunization: Secondary | ICD-10-CM

## 2015-08-10 MED ORDER — VITAMIN D3 50 MCG (2000 UT) PO CAPS: 2000.0000 [IU] | ORAL_CAPSULE | Freq: Every day | ORAL | Status: DC

## 2015-08-10 MED ORDER — LUBIPROSTONE 24 MCG PO CAPS: 24.0000 ug | ORAL_CAPSULE | Freq: Two times a day (BID) | ORAL | Status: DC

## 2015-08-10 MED ORDER — RANITIDINE HCL 150 MG PO TABS: 150.0000 mg | ORAL_TABLET | Freq: Two times a day (BID) | ORAL | Status: DC

## 2015-08-10 NOTE — Assessment & Plan Note (Signed)
Start Amitiza 24 mcg qd-bid

## 2015-08-10 NOTE — Assessment & Plan Note (Signed)
Zantac 150 mg bid  Trat IBS

## 2015-08-10 NOTE — Progress Notes (Signed)
Subjective:  Patient ID: Chad Spears, male    DOB: [birth year]  Age: 34 y.o. MRN: [id number]  CC: No chief complaint on file.   HPI Chad Spears presents for leg cramps, constipation, abd discomfort. No n/v. No wt loss  Outpatient Prescriptions Prior to Visit  Medication Sig Dispense Refill  . ibuprofen (ADVIL,MOTRIN) 800 MG tablet TAKE ONE TABLET BY MOUTH TWICE DAILY WITH FOOD 60 tablet 5   No facility-administered medications prior to visit.    ROS Review of Systems  Constitutional: Negative for appetite change, fatigue and unexpected weight change.  HENT: Negative for congestion, nosebleeds, sneezing, sore throat and trouble swallowing.   Eyes: Negative for itching and visual disturbance.  Respiratory: Negative for cough.   Cardiovascular: Negative for chest pain, palpitations and leg swelling.  Gastrointestinal: Positive for abdominal pain, constipation and abdominal distention. Negative for nausea, diarrhea and blood in stool.  Genitourinary: Negative for frequency and hematuria.  Musculoskeletal: Negative for back pain, joint swelling, gait problem and neck pain.  Skin: Negative for rash.  Neurological: Negative for dizziness, tremors, speech difficulty and weakness.  Psychiatric/Behavioral: Negative for suicidal ideas, sleep disturbance, dysphoric mood and agitation. The patient is not nervous/anxious.     Objective:  BP 110/70 mmHg  Pulse 61  Wt 215 lb (97.523 kg)  SpO2 97%  BP Readings from Last 3 Encounters:  08/10/15 110/70  07/13/15 114/82  03/30/15 108/70    Wt Readings from Last 3 Encounters:  08/10/15 215 lb (97.523 kg)  07/13/15 213 lb 6.4 oz (96.798 kg)  03/30/15 220 lb (99.791 kg)    Physical Exam  Constitutional: He is oriented to person, place, and time. He appears well-developed. No distress.  NAD  HENT:  Mouth/Throat: Oropharynx is clear and moist.  Eyes: Conjunctivae are normal. Pupils are equal, round, and reactive to light.  Neck: Normal  range of motion. No JVD present. No thyromegaly present.  Cardiovascular: Normal rate, regular rhythm, normal heart sounds and intact distal pulses.  Exam reveals no gallop and no friction rub.   No murmur heard. Pulmonary/Chest: Effort normal and breath sounds normal. No respiratory distress. He has no wheezes. He has no rales. He exhibits no tenderness.  Abdominal: Soft. Bowel sounds are normal. He exhibits no distension and no mass. There is no tenderness. There is no rebound and no guarding.  Musculoskeletal: Normal range of motion. He exhibits no edema or tenderness.  Lymphadenopathy:    He has no cervical adenopathy.  Neurological: He is alert and oriented to person, place, and time. He has normal reflexes. No cranial nerve deficit. He exhibits normal muscle tone. He displays a negative Romberg sign. Coordination and gait normal.  Skin: Skin is warm and dry. No rash noted.  Psychiatric: He has a normal mood and affect. His behavior is normal. Judgment and thought content normal.    Lab Results  Component Value Date   WBC 7.2 07/13/2015   HGB 16.2 07/13/2015   HCT 48.9 07/13/2015   PLT 235.0 11/25/2014   GLUCOSE 93 07/13/2015   CHOL 199 03/05/2010   TRIG 68.0 03/05/2010   HDL 39.50 03/05/2010   LDLCALC 146* 03/05/2010   ALT 27 07/13/2015   AST 22 07/13/2015   NA 139 07/13/2015   K 4.3 07/13/2015   CL 106 07/13/2015   CREATININE 0.88 07/13/2015   BUN 12 07/13/2015   CO2 25 07/13/2015   TSH 1.568 07/13/2015   INR 1.08 08/23/2010    No results found.  Assessment & Plan:   Diagnoses and all orders for this visit:  Need for influenza vaccination -     Flu Vaccine QUAD 36+ mos IM  Constipation - functional  IBS (irritable bowel syndrome)  Gastroesophageal reflux disease without esophagitis  Other orders -     ranitidine (ZANTAC) 150 MG tablet; Take 1 tablet (150 mg total) by mouth 2 (two) times daily. -     lubiprostone (AMITIZA) 24 MCG capsule; Take 1 capsule (24  mcg total) by mouth 2 (two) times daily with a meal. Can take once a day as well -     Cholecalciferol (VITAMIN D3) 2000 UNITS capsule; Take 1 capsule (2,000 Units total) by mouth daily.   I am having Mr. Freer start on ranitidine, lubiprostone, and Vitamin D3. I am also having him maintain his ibuprofen.  Meds ordered this encounter  Medications  . ranitidine (ZANTAC) 150 MG tablet    Sig: Take 1 tablet (150 mg total) by mouth 2 (two) times daily.    Dispense:  180 tablet    Refill:  3  . lubiprostone (AMITIZA) 24 MCG capsule    Sig: Take 1 capsule (24 mcg total) by mouth 2 (two) times daily with a meal. Can take once a day as well    Dispense:  60 capsule    Refill:  11  . Cholecalciferol (VITAMIN D3) 2000 UNITS capsule    Sig: Take 1 capsule (2,000 Units total) by mouth daily.    Dispense:  100 capsule    Refill:  3     Follow-up: Return in about 3 months (around 11/09/2015) for a follow-up visit.  Walker Kehr, MD

## 2015-08-10 NOTE — Progress Notes (Signed)
Pre visit review using our clinic review tool, if applicable. No additional management support is needed unless otherwise documented below in the visit note. 

## 2015-09-28 ENCOUNTER — Ambulatory Visit (INDEPENDENT_AMBULATORY_CARE_PROVIDER_SITE_OTHER): Payer: BLUE CROSS/BLUE SHIELD | Admitting: Internal Medicine

## 2015-09-28 ENCOUNTER — Encounter: Payer: Self-pay | Admitting: Internal Medicine

## 2015-09-28 VITALS — BP 102/70 | HR 59 | Ht 72.0 in | Wt 214.0 lb

## 2015-09-28 DIAGNOSIS — K5904 Chronic idiopathic constipation: Secondary | ICD-10-CM

## 2015-09-28 DIAGNOSIS — Z0000 Encounter for general adult medical examination without abnormal findings: Secondary | ICD-10-CM

## 2015-09-28 MED ORDER — LINACLOTIDE 290 MCG PO CAPS: 290.0000 ug | ORAL_CAPSULE | Freq: Every day | ORAL | Status: DC | PRN

## 2015-09-28 NOTE — Assessment & Plan Note (Signed)
We discussed age appropriate health related issues, including available/recomended screening tests and vaccinations. We discussed a need for adhering to healthy diet and exercise. Labs/EKG were reviewed/ordered. All questions were answered.   

## 2015-09-28 NOTE — Patient Instructions (Signed)
Make a rice bag 

## 2015-09-28 NOTE — Assessment & Plan Note (Signed)
IBS-C  9/16 Start Linzess; stop Amitiza 24 mcg

## 2015-09-28 NOTE — Progress Notes (Signed)
Subjective:  Patient ID: Chad Spears, male    DOB: [birth year]  Age: 34 y.o. MRN: [id number]  CC: Annual Exam   HPI Chad Spears presents for a well exam. C/o constipation; pt was nauseated  Outpatient Prescriptions Prior to Visit  Medication Sig Dispense Refill  . Cholecalciferol (VITAMIN D3) 2000 UNITS capsule Take 1 capsule (2,000 Units total) by mouth daily. 100 capsule 3  . ibuprofen (ADVIL,MOTRIN) 800 MG tablet TAKE ONE TABLET BY MOUTH TWICE DAILY WITH FOOD 60 tablet 5  . ranitidine (ZANTAC) 150 MG tablet Take 1 tablet (150 mg total) by mouth 2 (two) times daily. 180 tablet 3  . lubiprostone (AMITIZA) 24 MCG capsule Take 1 capsule (24 mcg total) by mouth 2 (two) times daily with a meal. Can take once a day as well (Patient not taking: Reported on 09/28/2015) 60 capsule 11   No facility-administered medications prior to visit.    ROS Review of Systems  Constitutional: Negative for appetite change, fatigue and unexpected weight change.  HENT: Negative for congestion, nosebleeds, sneezing, sore throat and trouble swallowing.   Eyes: Negative for itching and visual disturbance.  Respiratory: Negative for cough.   Cardiovascular: Negative for chest pain, palpitations and leg swelling.  Gastrointestinal: Positive for constipation. Negative for nausea, diarrhea, blood in stool and abdominal distention.  Genitourinary: Negative for frequency and hematuria.  Musculoskeletal: Negative for back pain, joint swelling, gait problem and neck pain.  Skin: Negative for rash.  Neurological: Negative for dizziness, tremors, speech difficulty and weakness.  Psychiatric/Behavioral: Negative for suicidal ideas, sleep disturbance, dysphoric mood and agitation. The patient is not nervous/anxious.     Objective:  BP 102/70 mmHg  Pulse 59  Ht 6' (1.829 m)  Wt 214 lb (97.07 kg)  BMI 29.02 kg/m2  SpO2 98%  BP Readings from Last 3 Encounters:  09/28/15 102/70  08/10/15 110/70  07/13/15 114/82      Wt Readings from Last 3 Encounters:  09/28/15 214 lb (97.07 kg)  08/10/15 215 lb (97.523 kg)  07/13/15 213 lb 6.4 oz (96.798 kg)    Physical Exam  Constitutional: He is oriented to person, place, and time. He appears well-developed. No distress.  NAD  HENT:  Mouth/Throat: Oropharynx is clear and moist.  Eyes: Conjunctivae are normal. Pupils are equal, round, and reactive to light.  Neck: Normal range of motion. No JVD present. No thyromegaly present.  Cardiovascular: Normal rate, regular rhythm, normal heart sounds and intact distal pulses.  Exam reveals no gallop and no friction rub.   No murmur heard. Pulmonary/Chest: Effort normal and breath sounds normal. No respiratory distress. He has no wheezes. He has no rales. He exhibits no tenderness.  Abdominal: Soft. Bowel sounds are normal. He exhibits no distension and no mass. There is no tenderness. There is no rebound and no guarding.  Musculoskeletal: Normal range of motion. He exhibits no edema or tenderness.  Lymphadenopathy:    He has no cervical adenopathy.  Neurological: He is alert and oriented to person, place, and time. He has normal reflexes. No cranial nerve deficit. He exhibits normal muscle tone. He displays a negative Romberg sign. Coordination and gait normal.  Skin: Skin is warm and dry. No rash noted.  Psychiatric: He has a normal mood and affect. His behavior is normal. Judgment and thought content normal.    Lab Results  Component Value Date   WBC 7.2 07/13/2015   HGB 16.2 07/13/2015   HCT 48.9 07/13/2015   PLT 235.0  11/25/2014   GLUCOSE 93 07/13/2015   CHOL 199 03/05/2010   TRIG 68.0 03/05/2010   HDL 39.50 03/05/2010   LDLCALC 146* 03/05/2010   ALT 27 07/13/2015   AST 22 07/13/2015   NA 139 07/13/2015   K 4.3 07/13/2015   CL 106 07/13/2015   CREATININE 0.88 07/13/2015   BUN 12 07/13/2015   CO2 25 07/13/2015   TSH 1.568 07/13/2015   INR 1.08 08/23/2010    No results found.  Assessment &  Plan:   Chad Spears was seen today for annual exam.  Diagnoses and all orders for this visit:  Functional constipation  Well adult exam  Other orders -     Linaclotide (LINZESS) 290 MCG CAPS capsule; Take 1 capsule (290 mcg total) by mouth daily as needed.   I have discontinued Mr. Glad lubiprostone. I am also having him start on Linaclotide. Additionally, I am having him maintain his ibuprofen, ranitidine, and Vitamin D3.  Meds ordered this encounter  Medications  . Linaclotide (LINZESS) 290 MCG CAPS capsule    Sig: Take 1 capsule (290 mcg total) by mouth daily as needed.    Dispense:  30 capsule    Refill:  11     Follow-up: Return in about 3 months (around 12/29/2015) for a follow-up visit.  Walker Kehr, MD

## 2015-09-28 NOTE — Progress Notes (Signed)
Pre visit review using our clinic review tool, if applicable. No additional management support is needed unless otherwise documented below in the visit note. 

## 2016-01-04 ENCOUNTER — Ambulatory Visit (INDEPENDENT_AMBULATORY_CARE_PROVIDER_SITE_OTHER): Payer: BLUE CROSS/BLUE SHIELD | Admitting: Internal Medicine

## 2016-01-04 ENCOUNTER — Encounter: Payer: Self-pay | Admitting: Internal Medicine

## 2016-01-04 VITALS — BP 130/84 | HR 65 | Wt 217.0 lb

## 2016-01-04 DIAGNOSIS — F450 Somatization disorder: Secondary | ICD-10-CM | POA: Diagnosis not present

## 2016-01-04 DIAGNOSIS — K589 Irritable bowel syndrome without diarrhea: Secondary | ICD-10-CM | POA: Diagnosis not present

## 2016-01-04 DIAGNOSIS — N528 Other male erectile dysfunction: Secondary | ICD-10-CM

## 2016-01-04 MED ORDER — LUBIPROSTONE 8 MCG PO CAPS: ORAL_CAPSULE | ORAL | Status: DC

## 2016-01-04 MED ORDER — VARDENAFIL HCL 20 MG PO TABS: 20.0000 mg | ORAL_TABLET | Freq: Every day | ORAL | Status: DC | PRN

## 2016-01-04 NOTE — Progress Notes (Signed)
Subjective:  Patient ID: Chad Spears, male    DOB: Jul 03, 1981  Age: 35 y.o. MRN: [id number]  CC: No chief complaint on file.   HPI Chad Spears presents for constipation, LBP, GERD f/u. Pt wants to d/c Linzess and try Amitiza again. F/u ED  Outpatient Prescriptions Prior to Visit  Medication Sig Dispense Refill  . Cholecalciferol (VITAMIN D3) 2000 UNITS capsule Take 1 capsule (2,000 Units total) by mouth daily. 100 capsule 3  . ibuprofen (ADVIL,MOTRIN) 800 MG tablet TAKE ONE TABLET BY MOUTH TWICE DAILY WITH FOOD 60 tablet 5  . Linaclotide (LINZESS) 290 MCG CAPS capsule Take 1 capsule (290 mcg total) by mouth daily as needed. 30 capsule 11  . ranitidine (ZANTAC) 150 MG tablet Take 1 tablet (150 mg total) by mouth 2 (two) times daily. 180 tablet 3   No facility-administered medications prior to visit.    ROS Review of Systems  Constitutional: Negative for appetite change, fatigue and unexpected weight change.  HENT: Negative for congestion, nosebleeds, sneezing, sore throat and trouble swallowing.   Eyes: Negative for itching and visual disturbance.  Respiratory: Negative for cough.   Cardiovascular: Negative for chest pain, palpitations and leg swelling.  Gastrointestinal: Positive for abdominal pain, constipation and abdominal distention. Negative for nausea, diarrhea and blood in stool.  Genitourinary: Negative for frequency and hematuria.  Musculoskeletal: Negative for back pain, joint swelling, gait problem and neck pain.  Skin: Negative for rash.  Neurological: Negative for dizziness, tremors, speech difficulty and weakness.  Psychiatric/Behavioral: Negative for suicidal ideas, sleep disturbance, dysphoric mood and agitation. The patient is not nervous/anxious.     Objective:  BP 130/84 mmHg  Pulse 65  Wt 217 lb (98.431 kg)  SpO2 98%  BP Readings from Last 3 Encounters:  01/04/16 130/84  09/28/15 102/70  08/10/15 110/70    Wt Readings from Last 3 Encounters:    01/04/16 217 lb (98.431 kg)  09/28/15 214 lb (97.07 kg)  08/10/15 215 lb (97.523 kg)    Physical Exam  Constitutional: He is oriented to person, place, and time. He appears well-developed. No distress.  NAD  HENT:  Mouth/Throat: Oropharynx is clear and moist.  Eyes: Conjunctivae are normal. Pupils are equal, round, and reactive to light.  Neck: Normal range of motion. No JVD present. No thyromegaly present.  Cardiovascular: Normal rate, regular rhythm, normal heart sounds and intact distal pulses.  Exam reveals no gallop and no friction rub.   No murmur heard. Pulmonary/Chest: Effort normal and breath sounds normal. No respiratory distress. He has no wheezes. He has no rales. He exhibits no tenderness.  Abdominal: Soft. Bowel sounds are normal. He exhibits no distension and no mass. There is no tenderness. There is no rebound and no guarding.  Musculoskeletal: Normal range of motion. He exhibits no edema or tenderness.  Lymphadenopathy:    He has no cervical adenopathy.  Neurological: He is alert and oriented to person, place, and time. He has normal reflexes. No cranial nerve deficit. He exhibits normal muscle tone. He displays a negative Romberg sign. Coordination and gait normal.  Skin: Skin is warm and dry. No rash noted.  Psychiatric: He has a normal mood and affect. His behavior is normal. Judgment and thought content normal.    Lab Results  Component Value Date   WBC 7.2 07/13/2015   HGB 16.2 07/13/2015   HCT 48.9 07/13/2015   PLT 235.0 11/25/2014   GLUCOSE 93 07/13/2015   CHOL 199 03/05/2010   TRIG  68.0 03/05/2010   HDL 39.50 03/05/2010   LDLCALC 146* 03/05/2010   ALT 27 07/13/2015   AST 22 07/13/2015   NA 139 07/13/2015   K 4.3 07/13/2015   CL 106 07/13/2015   CREATININE 0.88 07/13/2015   BUN 12 07/13/2015   CO2 25 07/13/2015   TSH 1.568 07/13/2015   INR 1.08 08/23/2010    No results found.  Assessment & Plan:   There are no diagnoses linked to this  encounter. I am having Chad Spears maintain his ibuprofen, ranitidine, Vitamin D3, and Linaclotide.  No orders of the defined types were placed in this encounter.     Follow-up: No Follow-up on file.  Walker Kehr, MD

## 2016-01-04 NOTE — Progress Notes (Signed)
Pre visit review using our clinic review tool, if applicable. No additional management support is needed unless otherwise documented below in the visit note. 

## 2016-01-04 NOTE — Assessment & Plan Note (Signed)
d/c Linzess and try Amitiza again

## 2016-01-04 NOTE — Assessment & Plan Note (Signed)
Better  

## 2016-01-05 ENCOUNTER — Ambulatory Visit: Payer: BLUE CROSS/BLUE SHIELD | Admitting: Internal Medicine

## 2016-01-07 NOTE — Assessment & Plan Note (Signed)
levitra prn

## 2016-02-08 ENCOUNTER — Other Ambulatory Visit: Payer: Self-pay | Admitting: Internal Medicine

## 2016-02-08 MED ORDER — VARDENAFIL HCL 20 MG PO TABS: 20.0000 mg | ORAL_TABLET | Freq: Every day | ORAL | Status: DC | PRN

## 2016-02-08 NOTE — Telephone Encounter (Signed)
Refill sent to [facility]!

## 2016-02-08 NOTE — Telephone Encounter (Signed)
Patient called in to request a refill of viagra

## 2016-02-09 MED ORDER — SILDENAFIL CITRATE 100 MG PO TABS: 100.0000 mg | ORAL_TABLET | ORAL | Status: DC | PRN

## 2016-02-09 NOTE — Telephone Encounter (Signed)
Done. See meds. Pt informed  

## 2016-02-09 NOTE — Addendum Note (Signed)
Addended by: Cresenciano Lick on: 02/09/2016 02:48 PM   Modules accepted: Orders

## 2016-02-09 NOTE — Telephone Encounter (Signed)
Patient states pharmacy has not received script for viagra.  Patient uses [facility] on [address].

## 2016-02-09 NOTE — Addendum Note (Signed)
Addended by: Cassandria Anger on: 02/09/2016 01:15 PM   Modules accepted: Orders, Medications

## 2016-02-09 NOTE — Telephone Encounter (Signed)
Please check, pt want to change to Viagra, Levitra is too expensive. Please help

## 2016-02-09 NOTE — Telephone Encounter (Signed)
Ok Thx 

## 2016-04-04 ENCOUNTER — Ambulatory Visit (INDEPENDENT_AMBULATORY_CARE_PROVIDER_SITE_OTHER): Payer: BLUE CROSS/BLUE SHIELD | Admitting: Internal Medicine

## 2016-04-04 ENCOUNTER — Encounter: Payer: Self-pay | Admitting: Internal Medicine

## 2016-04-04 VITALS — BP 110/70 | HR 63 | Temp 98.3°F | Ht 72.0 in | Wt 221.0 lb

## 2016-04-04 DIAGNOSIS — M545 Low back pain, unspecified: Secondary | ICD-10-CM

## 2016-04-04 DIAGNOSIS — N528 Other male erectile dysfunction: Secondary | ICD-10-CM | POA: Diagnosis not present

## 2016-04-04 DIAGNOSIS — K219 Gastro-esophageal reflux disease without esophagitis: Secondary | ICD-10-CM

## 2016-04-04 MED ORDER — TADALAFIL 20 MG PO TABS: 20.0000 mg | ORAL_TABLET | Freq: Every day | ORAL | Status: DC | PRN

## 2016-04-04 MED ORDER — SILDENAFIL CITRATE 100 MG PO TABS: 100.0000 mg | ORAL_TABLET | ORAL | Status: DC | PRN

## 2016-04-04 NOTE — Assessment & Plan Note (Signed)
Ibuprofen prn. Better.

## 2016-04-04 NOTE — Progress Notes (Signed)
Pre visit review using our clinic review tool, if applicable. No additional management support is needed unless otherwise documented below in the visit note. 

## 2016-04-04 NOTE — Assessment & Plan Note (Signed)
Zantac 150 mg bid

## 2016-04-04 NOTE — Progress Notes (Signed)
Subjective:  Patient ID: Chad Spears, male    DOB: 1980/12/26  Age: 35 y.o. MRN: [id number]  CC: No chief complaint on file.   HPI Chad Spears presents for GERD, ED, IBS f/u  Outpatient Prescriptions Prior to Visit  Medication Sig Dispense Refill  . ranitidine (ZANTAC) 150 MG tablet Take 1 tablet (150 mg total) by mouth 2 (two) times daily. 180 tablet 3  . sildenafil (VIAGRA) 100 MG tablet Take 1 tablet (100 mg total) by mouth as needed for erectile dysfunction. 12 tablet 11  . Cholecalciferol (VITAMIN D3) 2000 UNITS capsule Take 1 capsule (2,000 Units total) by mouth daily. (Patient not taking: Reported on 04/04/2016) 100 capsule 3  . ibuprofen (ADVIL,MOTRIN) 800 MG tablet TAKE ONE TABLET BY MOUTH TWICE DAILY WITH FOOD (Patient not taking: Reported on 04/04/2016) 60 tablet 5  . lubiprostone (AMITIZA) 8 MCG capsule 1 po qd or bid (Patient not taking: Reported on 04/04/2016) 60 capsule 5   No facility-administered medications prior to visit.    ROS Review of Systems  Constitutional: Negative for appetite change, fatigue and unexpected weight change.  HENT: Negative for congestion, nosebleeds, sneezing, sore throat and trouble swallowing.   Eyes: Negative for itching and visual disturbance.  Respiratory: Negative for cough.   Cardiovascular: Negative for chest pain, palpitations and leg swelling.  Gastrointestinal: Positive for abdominal pain. Negative for nausea, diarrhea, blood in stool and abdominal distention.  Genitourinary: Negative for frequency and hematuria.  Musculoskeletal: Negative for back pain, joint swelling, gait problem and neck pain.  Skin: Negative for rash.  Neurological: Negative for dizziness, tremors, speech difficulty and weakness.  Psychiatric/Behavioral: Negative for sleep disturbance, dysphoric mood and agitation. The patient is not nervous/anxious.   ED  Objective:  BP 110/70 mmHg  Pulse 63  Temp(Src) 98.3 F (36.8 C) (Oral)  Ht 6' (1.829 m)  Wt 221  lb (100.245 kg)  BMI 29.97 kg/m2  SpO2 98%  BP Readings from Last 3 Encounters:  04/04/16 110/70  01/04/16 130/84  09/28/15 102/70    Wt Readings from Last 3 Encounters:  04/04/16 221 lb (100.245 kg)  01/04/16 217 lb (98.431 kg)  09/28/15 214 lb (97.07 kg)    Physical Exam  Constitutional: He is oriented to person, place, and time. He appears well-developed. No distress.  NAD  HENT:  Mouth/Throat: Oropharynx is clear and moist.  Eyes: Conjunctivae are normal. Pupils are equal, round, and reactive to light.  Neck: Normal range of motion. No JVD present. No thyromegaly present.  Cardiovascular: Normal rate, regular rhythm, normal heart sounds and intact distal pulses.  Exam reveals no gallop and no friction rub.   No murmur heard. Pulmonary/Chest: Effort normal and breath sounds normal. No respiratory distress. He has no wheezes. He has no rales. He exhibits no tenderness.  Abdominal: Soft. Bowel sounds are normal. He exhibits no distension and no mass. There is no tenderness. There is no rebound and no guarding.  Musculoskeletal: Normal range of motion. He exhibits no edema or tenderness.  Lymphadenopathy:    He has no cervical adenopathy.  Neurological: He is alert and oriented to person, place, and time. He has normal reflexes. No cranial nerve deficit. He exhibits normal muscle tone. He displays a negative Romberg sign. Coordination and gait normal.  Skin: Skin is warm and dry. No rash noted.  Psychiatric: He has a normal mood and affect. His behavior is normal. Judgment and thought content normal.    Lab Results  Component Value  Date   WBC 7.2 07/13/2015   HGB 16.2 07/13/2015   HCT 48.9 07/13/2015   PLT 235.0 11/25/2014   GLUCOSE 93 07/13/2015   CHOL 199 03/05/2010   TRIG 68.0 03/05/2010   HDL 39.50 03/05/2010   LDLCALC 146* 03/05/2010   ALT 27 07/13/2015   AST 22 07/13/2015   NA 139 07/13/2015   K 4.3 07/13/2015   CL 106 07/13/2015   CREATININE 0.88 07/13/2015     BUN 12 07/13/2015   CO2 25 07/13/2015   TSH 1.568 07/13/2015   INR 1.08 08/23/2010    No results found.  Assessment & Plan:   There are no diagnoses linked to this encounter. I am having Mr. Romo maintain his ibuprofen, ranitidine, Vitamin D3, lubiprostone, and sildenafil.  Meds ordered this encounter  Medications  . sildenafil (VIAGRA) 100 MG tablet    Sig: Take 1 tablet (100 mg total) by mouth as needed for erectile dysfunction.    Dispense:  30 tablet    Refill:  11     Follow-up: No Follow-up on file.  Walker Kehr, MD

## 2016-04-04 NOTE — Assessment & Plan Note (Signed)
Cialis prn 

## 2016-07-01 ENCOUNTER — Other Ambulatory Visit: Payer: Self-pay | Admitting: *Deleted

## 2016-07-01 NOTE — Telephone Encounter (Signed)
Ok thx.

## 2016-07-01 NOTE — Telephone Encounter (Signed)
Rec'd fax stating pt insurance plan doesn't cover the cialis. Wanting to see if MD could change to Sildenafil 20 mg instead...Chad Spears

## 2016-07-02 MED ORDER — SILDENAFIL CITRATE 20 MG PO TABS: ORAL_TABLET | ORAL | 3 refills | Status: DC

## 2016-07-02 NOTE — Telephone Encounter (Signed)
Refill sent to pharmacy on the sildenafil...Johny Chess

## 2016-07-04 ENCOUNTER — Encounter: Payer: Self-pay | Admitting: Internal Medicine

## 2016-07-04 ENCOUNTER — Ambulatory Visit (INDEPENDENT_AMBULATORY_CARE_PROVIDER_SITE_OTHER): Payer: BLUE CROSS/BLUE SHIELD | Admitting: Internal Medicine

## 2016-07-04 VITALS — BP 110/60 | HR 69 | Temp 98.5°F | Ht 72.0 in | Wt 213.0 lb

## 2016-07-04 DIAGNOSIS — K2970 Gastritis, unspecified, without bleeding: Secondary | ICD-10-CM

## 2016-07-04 DIAGNOSIS — K5904 Chronic idiopathic constipation: Secondary | ICD-10-CM | POA: Diagnosis not present

## 2016-07-04 DIAGNOSIS — Z23 Encounter for immunization: Secondary | ICD-10-CM

## 2016-07-04 MED ORDER — OMEPRAZOLE MAGNESIUM 20 MG PO TBEC: 40.0000 mg | DELAYED_RELEASE_TABLET | Freq: Two times a day (BID) | ORAL | 1 refills | Status: DC

## 2016-07-04 MED ORDER — AMOXICILLIN 500 MG PO CAPS: 1000.0000 mg | ORAL_CAPSULE | Freq: Two times a day (BID) | ORAL | 0 refills | Status: DC

## 2016-07-04 MED ORDER — CLARITHROMYCIN 500 MG PO TABS: 500.0000 mg | ORAL_TABLET | Freq: Two times a day (BID) | ORAL | 0 refills | Status: AC

## 2016-07-04 NOTE — Progress Notes (Signed)
Pre visit review using our clinic review tool, if applicable. No additional management support is needed unless otherwise documented below in the visit note. 

## 2016-07-04 NOTE — Progress Notes (Signed)
Subjective:  Patient ID: Chad Spears, male    DOB: Jun 03, 1981  Age: 35 y.o. MRN: [id number]  CC: Follow-up (3 mos on stomach issues-pt states doing better)   HPI DAMORIAN ROZZI presents for constipation and abd pain, gas . He took a Nurse, adult before that took care of all his GI issues for years... He would like to use it again  Outpatient Medications Prior to Visit  Medication Sig Dispense Refill  . Cholecalciferol (VITAMIN D3) 2000 UNITS capsule Take 1 capsule (2,000 Units total) by mouth daily. (Patient not taking: Reported on 07/04/2016) 100 capsule 3  . ibuprofen (ADVIL,MOTRIN) 800 MG tablet TAKE ONE TABLET BY MOUTH TWICE DAILY WITH FOOD (Patient not taking: Reported on 07/04/2016) 60 tablet 5  . lubiprostone (AMITIZA) 8 MCG capsule 1 po qd or bid (Patient not taking: Reported on 04/04/2016) 60 capsule 5  . ranitidine (ZANTAC) 150 MG tablet Take 1 tablet (150 mg total) by mouth 2 (two) times daily. (Patient not taking: Reported on 07/04/2016) 180 tablet 3  . sildenafil (REVATIO) 20 MG tablet 1-5 tabs po prn (Patient not taking: Reported on 07/04/2016) 50 tablet 3   No facility-administered medications prior to visit.     ROS Review of Systems  Constitutional: Negative for appetite change, fatigue and unexpected weight change.  HENT: Negative for congestion, nosebleeds, sneezing, sore throat and trouble swallowing.   Eyes: Negative for itching and visual disturbance.  Respiratory: Negative for cough.   Cardiovascular: Negative for chest pain, palpitations and leg swelling.  Gastrointestinal: Positive for abdominal distention, abdominal pain, constipation and nausea. Negative for blood in stool and diarrhea.  Genitourinary: Negative for frequency and hematuria.  Musculoskeletal: Negative for back pain, gait problem, joint swelling and neck pain.  Skin: Negative for rash.  Neurological: Negative for dizziness, tremors, speech difficulty and weakness.  Psychiatric/Behavioral: Negative for  agitation, dysphoric mood and sleep disturbance. The patient is not nervous/anxious.     Objective:  BP 110/60 (BP Location: Left Arm, Patient Position: Sitting, Cuff Size: Large)   Pulse 69   Temp 98.5 F (36.9 C) (Oral)   Ht 6' (1.829 m)   Wt 213 lb (96.6 kg)   SpO2 99%   BMI 28.89 kg/m   BP Readings from Last 3 Encounters:  07/04/16 110/60  04/04/16 110/70  01/04/16 130/84    Wt Readings from Last 3 Encounters:  07/04/16 213 lb (96.6 kg)  04/04/16 221 lb (100.2 kg)  01/04/16 217 lb (98.4 kg)    Physical Exam  Constitutional: He is oriented to person, place, and time. He appears well-developed. No distress.  NAD  HENT:  Mouth/Throat: Oropharynx is clear and moist.  Eyes: Conjunctivae are normal. Pupils are equal, round, and reactive to light.  Neck: Normal range of motion. No JVD present. No thyromegaly present.  Cardiovascular: Normal rate, regular rhythm, normal heart sounds and intact distal pulses.  Exam reveals no gallop and no friction rub.   No murmur heard. Pulmonary/Chest: Effort normal and breath sounds normal. No respiratory distress. He has no wheezes. He has no rales. He exhibits no tenderness.  Abdominal: Soft. Bowel sounds are normal. He exhibits no distension and no mass. There is no tenderness. There is no rebound and no guarding.  Musculoskeletal: Normal range of motion. He exhibits no edema or tenderness.  Lymphadenopathy:    He has no cervical adenopathy.  Neurological: He is alert and oriented to person, place, and time. He has normal reflexes. No cranial nerve deficit. He  exhibits normal muscle tone. He displays a negative Romberg sign. Coordination and gait normal.  Skin: Skin is warm and dry. No rash noted.  Psychiatric: He has a normal mood and affect. His behavior is normal. Judgment and thought content normal.    Lab Results  Component Value Date   WBC 7.2 07/13/2015   HGB 16.2 07/13/2015   HCT 48.9 07/13/2015   PLT 235.0 11/25/2014    GLUCOSE 93 07/13/2015   CHOL 199 03/05/2010   TRIG 68.0 03/05/2010   HDL 39.50 03/05/2010   LDLCALC 146 (H) 03/05/2010   ALT 27 07/13/2015   AST 22 07/13/2015   NA 139 07/13/2015   K 4.3 07/13/2015   CL 106 07/13/2015   CREATININE 0.88 07/13/2015   BUN 12 07/13/2015   CO2 25 07/13/2015   TSH 1.568 07/13/2015   INR 1.08 08/23/2010    No results found.  Assessment & Plan:   There are no diagnoses linked to this encounter. I am having Mr. Hartsell maintain his ibuprofen, ranitidine, Vitamin D3, lubiprostone, and sildenafil.  No orders of the defined types were placed in this encounter.    Follow-up: No Follow-up on file.  Walker Kehr, MD

## 2016-07-06 ENCOUNTER — Encounter: Payer: Self-pay | Admitting: Internal Medicine

## 2016-07-06 NOTE — Assessment & Plan Note (Signed)
Linzess prn 

## 2016-09-26 ENCOUNTER — Ambulatory Visit: Payer: BLUE CROSS/BLUE SHIELD | Admitting: Internal Medicine

## 2016-12-30 ENCOUNTER — Ambulatory Visit: Payer: BLUE CROSS/BLUE SHIELD | Admitting: Internal Medicine

## 2017-01-01 ENCOUNTER — Ambulatory Visit: Payer: BLUE CROSS/BLUE SHIELD | Admitting: Internal Medicine

## 2017-01-27 ENCOUNTER — Encounter: Payer: Self-pay | Admitting: Internal Medicine

## 2017-01-27 ENCOUNTER — Ambulatory Visit (INDEPENDENT_AMBULATORY_CARE_PROVIDER_SITE_OTHER)
Admission: RE | Admit: 2017-01-27 | Discharge: 2017-01-27 | Disposition: A | Discharge status: Home / Self Care | Payer: BLUE CROSS/BLUE SHIELD | Source: Ambulatory Visit | Attending: Internal Medicine | Admitting: Internal Medicine

## 2017-01-27 ENCOUNTER — Ambulatory Visit (INDEPENDENT_AMBULATORY_CARE_PROVIDER_SITE_OTHER): Payer: BLUE CROSS/BLUE SHIELD | Admitting: Internal Medicine

## 2017-01-27 DIAGNOSIS — J209 Acute bronchitis, unspecified: Secondary | ICD-10-CM

## 2017-01-27 MED ORDER — AZITHROMYCIN 250 MG PO TABS: ORAL_TABLET | ORAL | 0 refills | Status: DC

## 2017-01-27 NOTE — Progress Notes (Signed)
Pre-visit discussion using our clinic review tool. No additional management support is needed unless otherwise documented below in the visit note.  

## 2017-01-27 NOTE — Progress Notes (Signed)
Subjective:  Patient ID: Chad Spears, male    DOB: 20-Jul-1981  Age: 36 y.o. MRN: [id number]  CC: Cough (fatigue, headache, 1 month )   HPI Chad Spears presents for cough x 1 mo - dark mucus  Outpatient Medications Prior to Visit  Medication Sig Dispense Refill  . omeprazole (PRILOSEC OTC) 20 MG tablet Take 2 tablets (40 mg total) by mouth 2 (two) times daily. 120 tablet 1  . sildenafil (REVATIO) 20 MG tablet 1-5 tabs po prn 50 tablet 3  . amoxicillin (AMOXIL) 500 MG capsule Take 2 capsules (1,000 mg total) by mouth 2 (two) times daily. 60 capsule 0  . Cholecalciferol (VITAMIN D3) 2000 UNITS capsule Take 1 capsule (2,000 Units total) by mouth daily. (Patient not taking: Reported on 07/04/2016) 100 capsule 3  . ibuprofen (ADVIL,MOTRIN) 800 MG tablet TAKE ONE TABLET BY MOUTH TWICE DAILY WITH FOOD (Patient not taking: Reported on 07/04/2016) 60 tablet 5  . lubiprostone (AMITIZA) 8 MCG capsule 1 po qd or bid (Patient not taking: Reported on 04/04/2016) 60 capsule 5  . ranitidine (ZANTAC) 150 MG tablet Take 1 tablet (150 mg total) by mouth 2 (two) times daily. (Patient not taking: Reported on 07/04/2016) 180 tablet 3   No facility-administered medications prior to visit.     ROS Review of Systems  Constitutional: Positive for unexpected weight change. Negative for appetite change and fatigue.  HENT: Negative for congestion, nosebleeds, sneezing, sore throat and trouble swallowing.   Eyes: Negative for itching and visual disturbance.  Respiratory: Positive for cough.   Cardiovascular: Negative for chest pain, palpitations and leg swelling.  Gastrointestinal: Negative for abdominal distention, blood in stool, diarrhea and nausea.  Genitourinary: Negative for frequency and hematuria.  Musculoskeletal: Negative for back pain, gait problem, joint swelling and neck pain.  Skin: Negative for rash.  Neurological: Negative for dizziness, tremors, speech difficulty and weakness.    Psychiatric/Behavioral: Negative for agitation, dysphoric mood and sleep disturbance. The patient is not nervous/anxious.     Objective:  BP 110/66   Pulse 65   Temp 98.7 F (37.1 C) (Oral)   Resp 16   Ht 6' (1.829 m)   Wt 231 lb 12 oz (105.1 kg)   SpO2 94%   BMI 31.43 kg/m   BP Readings from Last 3 Encounters:  01/27/17 110/66  07/04/16 110/60  04/04/16 110/70    Wt Readings from Last 3 Encounters:  01/27/17 231 lb 12 oz (105.1 kg)  07/04/16 213 lb (96.6 kg)  04/04/16 221 lb (100.2 kg)    Physical Exam  Constitutional: He is oriented to person, place, and time. He appears well-developed. No distress.  NAD  HENT:  Mouth/Throat: Oropharynx is clear and moist.  Eyes: Conjunctivae are normal. Pupils are equal, round, and reactive to light.  Neck: Normal range of motion. No JVD present. No thyromegaly present.  Cardiovascular: Normal rate, regular rhythm, normal heart sounds and intact distal pulses.  Exam reveals no gallop and no friction rub.   No murmur heard. Pulmonary/Chest: Effort normal and breath sounds normal. No respiratory distress. He has no wheezes. He has no rales. He exhibits no tenderness.  Abdominal: Soft. Bowel sounds are normal. He exhibits no distension and no mass. There is no tenderness. There is no rebound and no guarding.  Musculoskeletal: Normal range of motion. He exhibits no edema or tenderness.  Lymphadenopathy:    He has no cervical adenopathy.  Neurological: He is alert and oriented to person, place, and  time. He has normal reflexes. No cranial nerve deficit. He exhibits normal muscle tone. He displays a negative Romberg sign. Coordination and gait normal.  Skin: Skin is warm and dry. No rash noted.  Psychiatric: He has a normal mood and affect. His behavior is normal. Judgment and thought content normal.  Obese  Lab Results  Component Value Date   WBC 7.2 07/13/2015   HGB 16.2 07/13/2015   HCT 48.9 07/13/2015   PLT 235.0 11/25/2014    GLUCOSE 93 07/13/2015   CHOL 199 03/05/2010   TRIG 68.0 03/05/2010   HDL 39.50 03/05/2010   LDLCALC 146 (H) 03/05/2010   ALT 27 07/13/2015   AST 22 07/13/2015   NA 139 07/13/2015   K 4.3 07/13/2015   CL 106 07/13/2015   CREATININE 0.88 07/13/2015   BUN 12 07/13/2015   CO2 25 07/13/2015   TSH 1.568 07/13/2015   INR 1.08 08/23/2010    No results found.  Assessment & Plan:   There are no diagnoses linked to this encounter. I have discontinued Mr. Elsen ibuprofen, ranitidine, Vitamin D3, lubiprostone, and amoxicillin. I am also having him maintain his sildenafil and omeprazole.  No orders of the defined types were placed in this encounter.    Follow-up: No Follow-up on file.  Walker Kehr, MD

## 2017-01-27 NOTE — Assessment & Plan Note (Signed)
CXR Zpac 

## 2017-04-03 ENCOUNTER — Telehealth: Payer: Self-pay

## 2017-04-03 NOTE — Telephone Encounter (Signed)
Pt would like Rx for Viagra but for generic. [facility] on [address].

## 2017-04-03 NOTE — Telephone Encounter (Signed)
I see the patient is currently on Sildenafil. Patient wants Viagra, please advise.

## 2017-04-03 NOTE — Telephone Encounter (Signed)
LMTCB, received fax from [facility] for Viarga, but patient is currently on Sildenafil.

## 2017-04-04 MED ORDER — SILDENAFIL CITRATE 100 MG PO TABS: 100.0000 mg | ORAL_TABLET | ORAL | 11 refills | Status: DC | PRN

## 2017-04-04 NOTE — Telephone Encounter (Signed)
[place] Viagra - see Rx emailed Thx

## 2017-04-10 ENCOUNTER — Telehealth: Payer: Self-pay | Admitting: *Deleted

## 2017-04-10 NOTE — Telephone Encounter (Signed)
Rec'd call pt states he still have bronchitis sxs ongoing cough. He states he saw MD month or so ago and was given Zpac. Pt is wanting to get refill. Is this ok...Chad Spears

## 2017-04-10 NOTE — Telephone Encounter (Signed)
You can use over-the-counter  "cold" medicines  such as "Tylenol cold" , "Advil cold",  "Mucinex" or" Mucinex D"  for cough and congestion.   You can use plain "Tylenol" or "Advil" for fever, chills and achyness. Use Halls or Ricola cough drops.   "Common cold" symptoms are usually triggered by a virus.  The antibiotics are usually not necessary. On average, a" viral cold" illness would take 4-7 days to resolve.   Please, make an appointment if you are not better or if you're worse.

## 2017-04-11 NOTE — Telephone Encounter (Signed)
Advised patient of dr plotnikovs note and instructions, he will try that for 4-7 days, if no better, will call back to make office visit

## 2017-05-06 ENCOUNTER — Ambulatory Visit: Payer: BLUE CROSS/BLUE SHIELD | Admitting: Internal Medicine

## 2017-05-29 ENCOUNTER — Ambulatory Visit: Payer: BLUE CROSS/BLUE SHIELD | Admitting: Internal Medicine

## 2017-06-26 ENCOUNTER — Ambulatory Visit (INDEPENDENT_AMBULATORY_CARE_PROVIDER_SITE_OTHER): Payer: BLUE CROSS/BLUE SHIELD | Admitting: Internal Medicine

## 2017-06-26 ENCOUNTER — Other Ambulatory Visit (INDEPENDENT_AMBULATORY_CARE_PROVIDER_SITE_OTHER): Payer: BLUE CROSS/BLUE SHIELD

## 2017-06-26 ENCOUNTER — Encounter: Payer: Self-pay | Admitting: Internal Medicine

## 2017-06-26 DIAGNOSIS — Z0000 Encounter for general adult medical examination without abnormal findings: Secondary | ICD-10-CM

## 2017-06-26 LAB — CBC WITH DIFFERENTIAL/PLATELET
Basophils Absolute: 0.1 10*3/uL (ref 0.0–0.1)
Basophils Relative: 1.4 % (ref 0.0–3.0)
EOS PCT: 6.8 % — AB (ref 0.0–5.0)
Eosinophils Absolute: 0.5 10*3/uL (ref 0.0–0.7)
HCT: 46.4 % (ref 39.0–52.0)
Hemoglobin: 15.2 g/dL (ref 13.0–17.0)
LYMPHS ABS: 2.8 10*3/uL (ref 0.7–4.0)
Lymphocytes Relative: 37.4 % (ref 12.0–46.0)
MCHC: 32.8 g/dL (ref 30.0–36.0)
MCV: 80.2 fl (ref 78.0–100.0)
MONOS PCT: 9.8 % (ref 3.0–12.0)
Monocytes Absolute: 0.7 10*3/uL (ref 0.1–1.0)
NEUTROS ABS: 3.4 10*3/uL (ref 1.4–7.7)
NEUTROS PCT: 44.6 % (ref 43.0–77.0)
PLATELETS: 263 10*3/uL (ref 150.0–400.0)
RBC: 5.79 Mil/uL (ref 4.22–5.81)
RDW: 14.9 % (ref 11.5–15.5)
WBC: 7.5 10*3/uL (ref 4.0–10.5)

## 2017-06-26 LAB — URINALYSIS
Hgb urine dipstick: NEGATIVE
Ketones, ur: NEGATIVE
Leukocytes, UA: NEGATIVE
Nitrite: NEGATIVE
PH: 6 (ref 5.0–8.0)
SPECIFIC GRAVITY, URINE: 1.025 (ref 1.000–1.030)
Total Protein, Urine: NEGATIVE
URINE GLUCOSE: NEGATIVE
Urobilinogen, UA: 1 (ref 0.0–1.0)

## 2017-06-26 LAB — LIPID PANEL
CHOL/HDL RATIO: 4
CHOLESTEROL: 186 mg/dL (ref 0–200)
HDL: 51.4 mg/dL (ref >39.00)
LDL CALC: 124 mg/dL — AB (ref 0–99)
NonHDL: 134.95
Triglycerides: 56 mg/dL (ref 0.0–149.0)
VLDL: 11.2 mg/dL (ref 0.0–40.0)

## 2017-06-26 LAB — HEPATIC FUNCTION PANEL
ALT: 27 U/L (ref 0–53)
AST: 24 U/L (ref 0–37)
Albumin: 4 g/dL (ref 3.5–5.2)
Alkaline Phosphatase: 82 U/L (ref 39–117)
BILIRUBIN TOTAL: 0.5 mg/dL (ref 0.2–1.2)
Bilirubin, Direct: 0.2 mg/dL (ref 0.0–0.3)
TOTAL PROTEIN: 6.7 g/dL (ref 6.0–8.3)

## 2017-06-26 LAB — TSH: TSH: 1.52 u[IU]/mL (ref 0.35–4.50)

## 2017-06-26 LAB — BASIC METABOLIC PANEL
BUN: 11 mg/dL (ref 6–23)
CHLORIDE: 109 meq/L (ref 96–112)
CO2: 26 meq/L (ref 19–32)
Calcium: 9.3 mg/dL (ref 8.4–10.5)
Creatinine, Ser: 0.78 mg/dL (ref 0.40–1.50)
GFR: 142.83 mL/min (ref >60.00)
GLUCOSE: 88 mg/dL (ref 70–99)
POTASSIUM: 4 meq/L (ref 3.5–5.1)
SODIUM: 140 meq/L (ref 135–145)

## 2017-06-26 NOTE — Assessment & Plan Note (Signed)
We discussed age appropriate health related issues, including available/recomended screening tests and vaccinations. We discussed a need for adhering to healthy diet and exercise. Labs were ordered to be later reviewed . All questions were answered.   

## 2017-06-26 NOTE — Progress Notes (Signed)
Subjective:  Patient ID: Chad Spears, male    DOB: [birth year]  Age: 36 y.o. MRN: [id number]  CC: No chief complaint on file.   HPI Chad Spears presents for well exam and for fatigue, GERD and ED f/u  Outpatient Medications Prior to Visit  Medication Sig Dispense Refill  . azithromycin (ZITHROMAX) 250 MG tablet As directed 6 tablet 0  . omeprazole (PRILOSEC OTC) 20 MG tablet Take 2 tablets (40 mg total) by mouth 2 (two) times daily. 120 tablet 1  . sildenafil (REVATIO) 20 MG tablet 1-5 tabs po prn 50 tablet 3  . sildenafil (VIAGRA) 100 MG tablet Take 1 tablet (100 mg total) by mouth as needed for erectile dysfunction. 12 tablet 11   No facility-administered medications prior to visit.     ROS Review of Systems  Constitutional: Positive for fatigue. Negative for appetite change and unexpected weight change.  HENT: Negative for congestion, nosebleeds, sneezing, sore throat and trouble swallowing.   Eyes: Negative for itching and visual disturbance.  Respiratory: Negative for cough.   Cardiovascular: Negative for chest pain, palpitations and leg swelling.  Gastrointestinal: Negative for abdominal distention, blood in stool, diarrhea and nausea.  Genitourinary: Negative for frequency and hematuria.  Musculoskeletal: Negative for back pain, gait problem, joint swelling and neck pain.  Skin: Negative for rash.  Neurological: Negative for dizziness, tremors, speech difficulty and weakness.  Psychiatric/Behavioral: Negative for agitation, dysphoric mood, sleep disturbance and suicidal ideas. The patient is not nervous/anxious.     Objective:  BP 108/80 (BP Location: Left Arm, Patient Position: Sitting, Cuff Size: Normal)   Pulse 64   Temp 98.3 F (36.8 C) (Oral)   Ht 6' (1.829 m)   Wt 229 lb (103.9 kg)   SpO2 99%   BMI 31.06 kg/m   BP Readings from Last 3 Encounters:  06/26/17 108/80  01/27/17 110/66  07/04/16 110/60    Wt Readings from Last 3 Encounters:  06/26/17 229 lb  (103.9 kg)  01/27/17 231 lb 12 oz (105.1 kg)  07/04/16 213 lb (96.6 kg)    Physical Exam  Constitutional: He is oriented to person, place, and time. He appears well-developed. No distress.  NAD  HENT:  Mouth/Throat: Oropharynx is clear and moist.  Eyes: Pupils are equal, round, and reactive to light. Conjunctivae are normal.  Neck: Normal range of motion. No JVD present. No thyromegaly present.  Cardiovascular: Normal rate, regular rhythm, normal heart sounds and intact distal pulses.  Exam reveals no gallop and no friction rub.   No murmur heard. Pulmonary/Chest: Effort normal and breath sounds normal. No respiratory distress. He has no wheezes. He has no rales. He exhibits no tenderness.  Abdominal: Soft. Bowel sounds are normal. He exhibits no distension and no mass. There is no tenderness. There is no rebound and no guarding.  Musculoskeletal: Normal range of motion. He exhibits no edema or tenderness.  Lymphadenopathy:    He has no cervical adenopathy.  Neurological: He is alert and oriented to person, place, and time. He has normal reflexes. No cranial nerve deficit. He exhibits normal muscle tone. He displays a negative Romberg sign. Coordination and gait normal.  Skin: Skin is warm and dry. No rash noted.  Psychiatric: He has a normal mood and affect. His behavior is normal. Judgment and thought content normal.    Lab Results  Component Value Date   WBC 7.2 07/13/2015   HGB 16.2 07/13/2015   HCT 48.9 07/13/2015   PLT 235.0 11/25/2014  GLUCOSE 93 07/13/2015   CHOL 199 03/05/2010   TRIG 68.0 03/05/2010   HDL 39.50 03/05/2010   LDLCALC 146 (H) 03/05/2010   ALT 27 07/13/2015   AST 22 07/13/2015   NA 139 07/13/2015   K 4.3 07/13/2015   CL 106 07/13/2015   CREATININE 0.88 07/13/2015   BUN 12 07/13/2015   CO2 25 07/13/2015   TSH 1.568 07/13/2015   INR 1.08 08/23/2010    Dg Chest 2 View  Result Date: 01/27/2017 CLINICAL DATA:  Cough. EXAM: CHEST  2 VIEW COMPARISON:   07/13/2015. FINDINGS: Mediastinum and hilar structures are normal. Mild peribronchial cuffing noted. Mild bronchitis cannot be excluded . Mild right base subsegmental atelectasis. No pleural effusion or pneumothorax. IMPRESSION: Mild peribronchial cuffing noted. Mild bronchitis cannot be excluded. Mild right base subsegmental atelectasis . Electronically Signed   By: Marcello Moores  Register   On: 01/27/2017 11:05    Assessment & Plan:   There are no diagnoses linked to this encounter. I am having Mr. Strohmeier maintain his sildenafil, omeprazole, azithromycin, and sildenafil.  No orders of the defined types were placed in this encounter.    Follow-up: No Follow-up on file.  Walker Kehr, MD

## 2017-07-07 ENCOUNTER — Other Ambulatory Visit: Payer: Self-pay | Admitting: Internal Medicine

## 2017-07-08 NOTE — Telephone Encounter (Signed)
Pt called regarding this refill Last seen 06/26/2017 Please advise

## 2017-07-08 NOTE — Telephone Encounter (Signed)
Reviewed chart pt is up-to-date sent refills to [facility]  

## 2017-07-16 ENCOUNTER — Other Ambulatory Visit: Payer: Self-pay

## 2017-07-16 MED ORDER — SILDENAFIL CITRATE 20 MG PO TABS: ORAL_TABLET | ORAL | 1 refills | Status: DC

## 2017-09-05 ENCOUNTER — Ambulatory Visit: Payer: BLUE CROSS/BLUE SHIELD | Admitting: Internal Medicine

## 2017-09-18 ENCOUNTER — Ambulatory Visit: Payer: BLUE CROSS/BLUE SHIELD | Admitting: Internal Medicine

## 2017-09-19 ENCOUNTER — Encounter (HOSPITAL_COMMUNITY): Payer: Self-pay | Admitting: Emergency Medicine

## 2017-09-19 ENCOUNTER — Emergency Department (HOSPITAL_COMMUNITY)
Admission: EM | Admit: 2017-09-19 | Discharge: 2017-09-19 | Disposition: A | Discharge status: Home / Self Care | Payer: BLUE CROSS/BLUE SHIELD | Attending: Emergency Medicine | Admitting: Emergency Medicine

## 2017-09-19 ENCOUNTER — Other Ambulatory Visit: Payer: Self-pay

## 2017-09-19 DIAGNOSIS — Z79899 Other long term (current) drug therapy: Secondary | ICD-10-CM | POA: Insufficient documentation

## 2017-09-19 DIAGNOSIS — J029 Acute pharyngitis, unspecified: Secondary | ICD-10-CM | POA: Insufficient documentation

## 2017-09-19 DIAGNOSIS — R509 Fever, unspecified: Secondary | ICD-10-CM | POA: Diagnosis not present

## 2017-09-19 DIAGNOSIS — J069 Acute upper respiratory infection, unspecified: Secondary | ICD-10-CM | POA: Diagnosis not present

## 2017-09-19 DIAGNOSIS — R05 Cough: Secondary | ICD-10-CM | POA: Diagnosis present

## 2017-09-19 MED ORDER — BENZONATATE 100 MG PO CAPS: 100.0000 mg | ORAL_CAPSULE | Freq: Three times a day (TID) | ORAL | 0 refills | Status: DC

## 2017-09-19 MED ORDER — IBUPROFEN 600 MG PO TABS: 600.0000 mg | ORAL_TABLET | Freq: Four times a day (QID) | ORAL | 0 refills | Status: DC | PRN

## 2017-09-19 NOTE — ED Triage Notes (Signed)
Pt complaint of cough, nasal congestion, sore throat, and ear pain.

## 2017-09-19 NOTE — ED Notes (Addendum)
Pt alert and oriented x 4 and verbally responsive, presents with family with similar complaints of sore throat and reports other family members being sick.  No redness or swelling is noted.
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
## 2017-09-19 NOTE — Discharge Instructions (Signed)
Take Ibuprofen three times daily for pain Take Tessalon (cough medicine) three times daily Follow up with your doctor

## 2017-09-21 ENCOUNTER — Emergency Department (HOSPITAL_COMMUNITY)
Admission: EM | Admit: 2017-09-21 | Discharge: 2017-09-21 | Disposition: A | Discharge status: Home / Self Care | Payer: BLUE CROSS/BLUE SHIELD | Attending: Emergency Medicine | Admitting: Emergency Medicine

## 2017-09-21 ENCOUNTER — Encounter (HOSPITAL_COMMUNITY): Payer: Self-pay | Admitting: Emergency Medicine

## 2017-09-21 ENCOUNTER — Emergency Department (HOSPITAL_COMMUNITY): Payer: BLUE CROSS/BLUE SHIELD

## 2017-09-21 DIAGNOSIS — Z79899 Other long term (current) drug therapy: Secondary | ICD-10-CM | POA: Insufficient documentation

## 2017-09-21 DIAGNOSIS — J069 Acute upper respiratory infection, unspecified: Secondary | ICD-10-CM

## 2017-09-21 DIAGNOSIS — H9201 Otalgia, right ear: Secondary | ICD-10-CM | POA: Insufficient documentation

## 2017-09-21 DIAGNOSIS — R05 Cough: Secondary | ICD-10-CM | POA: Diagnosis present

## 2017-09-21 MED ORDER — AZITHROMYCIN 250 MG PO TABS: 250.0000 mg | ORAL_TABLET | Freq: Every day | ORAL | 0 refills | Status: DC

## 2017-09-21 MED ORDER — GUAIFENESIN-CODEINE 100-10 MG/5ML PO SOLN: 5.0000 mL | Freq: Three times a day (TID) | ORAL | 0 refills | Status: DC | PRN
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
## 2017-09-21 NOTE — Discharge Instructions (Signed)
Please read attached information. If you experience any new or worsening signs or symptoms please return to the emergency room for evaluation. Please follow-up with your primary care provider or specialist as discussed.  Please hold antibiotics, if you continue to have worsening symptoms over the next several days initiate antibiotics.  If symptoms become severe or persistent please return or follow-up with your primary care provider for reevaluation.  Please do not drink, drive, or operate heavy machinery while using medications.

## 2017-09-21 NOTE — ED Triage Notes (Signed)
Patient c/o productive cough, congestion, right ear pain and sore throat x6 days. Speaking in full sentences without difficulty in NAD.

## 2017-09-21 NOTE — ED Notes (Signed)
Pt is c/o of sore throat x 1  Week. Pt was seen her earlier this week for similar complaint. Pt has been around his children in who have been sick.

## 2017-11-06 ENCOUNTER — Encounter: Payer: Self-pay | Admitting: Internal Medicine

## 2017-11-06 ENCOUNTER — Ambulatory Visit (INDEPENDENT_AMBULATORY_CARE_PROVIDER_SITE_OTHER): Payer: BLUE CROSS/BLUE SHIELD | Admitting: Internal Medicine

## 2017-11-06 VITALS — BP 110/72 | HR 78 | Temp 98.4°F | Ht 72.0 in | Wt 240.0 lb

## 2017-11-06 DIAGNOSIS — M545 Low back pain, unspecified: Secondary | ICD-10-CM

## 2017-11-06 DIAGNOSIS — Z23 Encounter for immunization: Secondary | ICD-10-CM

## 2017-11-06 DIAGNOSIS — R12 Heartburn: Secondary | ICD-10-CM

## 2017-11-06 DIAGNOSIS — G8929 Other chronic pain: Secondary | ICD-10-CM

## 2017-11-06 MED ORDER — NAPROXEN 500 MG PO TABS: 500.0000 mg | ORAL_TABLET | Freq: Two times a day (BID) | ORAL | 1 refills | Status: DC | PRN

## 2017-11-06 MED ORDER — VITAMIN D3 50 MCG (2000 UT) PO CAPS: 2000.0000 [IU] | ORAL_CAPSULE | Freq: Every day | ORAL | 3 refills | Status: DC

## 2017-11-06 NOTE — Patient Instructions (Signed)
Stretch  

## 2017-11-06 NOTE — Assessment & Plan Note (Signed)
Naproxen prn, Vit D

## 2017-11-06 NOTE — Assessment & Plan Note (Signed)
Omeprazole prn.

## 2017-11-06 NOTE — Addendum Note (Signed)
Addended by: Karren Cobble on: 11/06/2017 10:55 AM   Modules accepted: Orders

## 2017-11-06 NOTE — Progress Notes (Signed)
Subjective:  Patient ID: Chad Spears, male    DOB: June 04, 1981  Age: 36 y.o. MRN: [id number]  CC: No chief complaint on file.   HPI Chad Spears presents for fatigue, GERD, ED f/u. C/o arthralgias  Outpatient Medications Prior to Visit  Medication Sig Dispense Refill  . ibuprofen (ADVIL,MOTRIN) 600 MG tablet Take 1 tablet (600 mg total) every 6 (six) hours as needed by mouth. 30 tablet 0  . omeprazole (PRILOSEC OTC) 20 MG tablet Take 2 tablets (40 mg total) by mouth 2 (two) times daily. 120 tablet 1  . sildenafil (REVATIO) 20 MG tablet TAKE ONE TO FIVE TABLETS BY MOUTH AS NEEDED 50 tablet 1  . sildenafil (VIAGRA) 100 MG tablet Take 1 tablet (100 mg total) by mouth as needed for erectile dysfunction. 12 tablet 11  . azithromycin (ZITHROMAX) 250 MG tablet Take 1 tablet (250 mg total) daily by mouth. Take first 2 tablets together, then 1 every day until finished. 6 tablet 0  . benzonatate (TESSALON) 100 MG capsule Take 1 capsule (100 mg total) every 8 (eight) hours by mouth. 21 capsule 0  . guaiFENesin-codeine 100-10 MG/5ML syrup Take 5 mLs 3 (three) times daily as needed by mouth for cough. 120 mL 0   No facility-administered medications prior to visit.     ROS Review of Systems  Constitutional: Negative for appetite change, fatigue and unexpected weight change.  HENT: Negative for congestion, nosebleeds, sneezing, sore throat and trouble swallowing.   Eyes: Negative for itching and visual disturbance.  Respiratory: Negative for cough.   Cardiovascular: Negative for chest pain, palpitations and leg swelling.  Gastrointestinal: Negative for abdominal distention, blood in stool, diarrhea and nausea.  Genitourinary: Negative for frequency and hematuria.  Musculoskeletal: Positive for arthralgias and back pain. Negative for gait problem, joint swelling and neck pain.  Skin: Negative for rash.  Neurological: Negative for dizziness, tremors, speech difficulty and weakness.    Psychiatric/Behavioral: Negative for agitation, dysphoric mood and sleep disturbance. The patient is not nervous/anxious.     Objective:  BP 110/72 (BP Location: Left Arm, Patient Position: Sitting, Cuff Size: Large)   Pulse 78   Temp 98.4 F (36.9 C) (Oral)   Ht 6' (1.829 m)   Wt 240 lb (108.9 kg)   SpO2 99%   BMI 32.55 kg/m   BP Readings from Last 3 Encounters:  11/06/17 110/72  09/21/17 130/78  09/19/17 140/80    Wt Readings from Last 3 Encounters:  11/06/17 240 lb (108.9 kg)  06/26/17 229 lb (103.9 kg)  01/27/17 231 lb 12 oz (105.1 kg)    Physical Exam  Constitutional: He is oriented to person, place, and time. He appears well-developed. No distress.  NAD  HENT:  Mouth/Throat: Oropharynx is clear and moist.  Eyes: Conjunctivae are normal. Pupils are equal, round, and reactive to light.  Neck: Normal range of motion. No JVD present. No thyromegaly present.  Cardiovascular: Normal rate, regular rhythm, normal heart sounds and intact distal pulses. Exam reveals no gallop and no friction rub.  No murmur heard. Pulmonary/Chest: Effort normal and breath sounds normal. No respiratory distress. He has no wheezes. He has no rales. He exhibits no tenderness.  Abdominal: Soft. Bowel sounds are normal. He exhibits no distension and no mass. There is no tenderness. There is no rebound and no guarding.  Musculoskeletal: Normal range of motion. He exhibits tenderness. He exhibits no edema.  Lymphadenopathy:    He has no cervical adenopathy.  Neurological: He is  alert and oriented to person, place, and time. He has normal reflexes. No cranial nerve deficit. He exhibits normal muscle tone. He displays a negative Romberg sign. Coordination and gait normal.  Skin: Skin is warm and dry. No rash noted.  Psychiatric: He has a normal mood and affect. His behavior is normal. Judgment and thought content normal.  LS is a little tender w/ROM obese  Lab Results  Component Value Date   WBC  7.5 06/26/2017   HGB 15.2 06/26/2017   HCT 46.4 06/26/2017   PLT 263.0 06/26/2017   GLUCOSE 88 06/26/2017   CHOL 186 06/26/2017   TRIG 56.0 06/26/2017   HDL 51.40 06/26/2017   LDLCALC 124 (H) 06/26/2017   ALT 27 06/26/2017   AST 24 06/26/2017   NA 140 06/26/2017   K 4.0 06/26/2017   CL 109 06/26/2017   CREATININE 0.78 06/26/2017   BUN 11 06/26/2017   CO2 26 06/26/2017   TSH 1.52 06/26/2017   INR 1.08 08/23/2010    Dg Chest 2 View  Result Date: 09/21/2017 CLINICAL DATA:  Cough and congestion.  Fever EXAM: CHEST  2 VIEW COMPARISON:  None. FINDINGS: Normal mediastinum and cardiac silhouette. Normal pulmonary vasculature. No evidence of effusion, infiltrate, or pneumothorax. No acute bony abnormality. IMPRESSION: No acute cardiopulmonary process. Electronically Signed   By: Suzy Bouchard M.D.   On: 09/21/2017 13:51    Assessment & Plan:   There are no diagnoses linked to this encounter. I have discontinued Nelda Severe. Bicknell's benzonatate, azithromycin, and guaiFENesin-codeine. I am also having him maintain his omeprazole, sildenafil, sildenafil, and ibuprofen.  No orders of the defined types were placed in this encounter.    Follow-up: No Follow-up on file.  Walker Kehr, MD

## 2018-02-05 ENCOUNTER — Ambulatory Visit: Payer: BLUE CROSS/BLUE SHIELD | Admitting: Internal Medicine

## 2018-03-06 ENCOUNTER — Ambulatory Visit: Payer: BLUE CROSS/BLUE SHIELD | Admitting: Internal Medicine

## 2018-03-06 DIAGNOSIS — Z0289 Encounter for other administrative examinations: Secondary | ICD-10-CM

## 2018-03-12 ENCOUNTER — Encounter: Payer: Self-pay | Admitting: Internal Medicine

## 2018-03-12 ENCOUNTER — Ambulatory Visit (INDEPENDENT_AMBULATORY_CARE_PROVIDER_SITE_OTHER): Payer: BLUE CROSS/BLUE SHIELD | Admitting: Internal Medicine

## 2018-03-12 DIAGNOSIS — E669 Obesity, unspecified: Secondary | ICD-10-CM | POA: Insufficient documentation

## 2018-03-12 DIAGNOSIS — E66811 Obesity, class 1: Secondary | ICD-10-CM | POA: Insufficient documentation

## 2018-03-12 DIAGNOSIS — M542 Cervicalgia: Secondary | ICD-10-CM

## 2018-03-12 MED ORDER — VITAMIN D3 50 MCG (2000 UT) PO CAPS: 2000.0000 [IU] | ORAL_CAPSULE | Freq: Every day | ORAL | 3 refills | Status: DC

## 2018-03-12 NOTE — Assessment & Plan Note (Signed)
contour pillow

## 2018-03-12 NOTE — Assessment & Plan Note (Signed)
Low carb diet 

## 2018-03-12 NOTE — Progress Notes (Signed)
Subjective:  Patient ID: Chad Spears, male    DOB: 1981-02-07  Age: 37 y.o. MRN: [id number]  CC: No chief complaint on file.   HPI RICKARDO BRINEGAR presents for arthralgia, ED, wt gain f/u  Outpatient Medications Prior to Visit  Medication Sig Dispense Refill  . sildenafil (REVATIO) 20 MG tablet TAKE ONE TO FIVE TABLETS BY MOUTH AS NEEDED 50 tablet 1  . Cholecalciferol (VITAMIN D3) 2000 units capsule Take 1 capsule (2,000 Units total) by mouth daily. (Patient not taking: Reported on 03/12/2018) 100 capsule 3  . naproxen (NAPROSYN) 500 MG tablet Take 1 tablet (500 mg total) by mouth 2 (two) times daily as needed for moderate pain. (Patient not taking: Reported on 03/12/2018) 60 tablet 1   No facility-administered medications prior to visit.     ROS Review of Systems  Constitutional: Negative for appetite change, fatigue and unexpected weight change.  HENT: Negative for congestion, nosebleeds, sneezing, sore throat and trouble swallowing.   Eyes: Negative for itching and visual disturbance.  Respiratory: Negative for cough.   Cardiovascular: Negative for chest pain, palpitations and leg swelling.  Gastrointestinal: Positive for abdominal pain. Negative for abdominal distention, blood in stool, diarrhea and nausea.  Genitourinary: Negative for frequency and hematuria.  Musculoskeletal: Positive for arthralgias and neck pain. Negative for back pain, gait problem and joint swelling.  Skin: Negative for rash.  Neurological: Negative for dizziness, tremors, speech difficulty and weakness.  Psychiatric/Behavioral: Negative for agitation, dysphoric mood and sleep disturbance. The patient is not nervous/anxious.     Objective:  BP 112/76 (BP Location: Left Arm, Patient Position: Sitting, Cuff Size: Large)   Pulse (!) 57   Temp 98.1 F (36.7 C) (Oral)   Ht 6' (1.829 m)   Wt 243 lb (110.2 kg)   SpO2 99%   BMI 32.96 kg/m   BP Readings from Last 3 Encounters:  03/12/18 112/76  11/06/17  110/72  09/21/17 130/78    Wt Readings from Last 3 Encounters:  03/12/18 243 lb (110.2 kg)  11/06/17 240 lb (108.9 kg)  06/26/17 229 lb (103.9 kg)    Physical Exam  Constitutional: He is oriented to person, place, and time. He appears well-developed. No distress.  NAD  HENT:  Mouth/Throat: Oropharynx is clear and moist.  Eyes: Pupils are equal, round, and reactive to light. Conjunctivae are normal.  Neck: Normal range of motion. No JVD present. No thyromegaly present.  Cardiovascular: Normal rate, regular rhythm, normal heart sounds and intact distal pulses. Exam reveals no gallop and no friction rub.  No murmur heard. Pulmonary/Chest: Effort normal and breath sounds normal. No respiratory distress. He has no wheezes. He has no rales. He exhibits no tenderness.  Abdominal: Soft. Bowel sounds are normal. He exhibits no distension and no mass. There is no tenderness. There is no rebound and no guarding.  Musculoskeletal: Normal range of motion. He exhibits tenderness. He exhibits no edema.  Lymphadenopathy:    He has no cervical adenopathy.  Neurological: He is alert and oriented to person, place, and time. He has normal reflexes. No cranial nerve deficit. He exhibits normal muscle tone. He displays a negative Romberg sign. Coordination and gait normal.  Skin: Skin is warm and dry. No rash noted.  Psychiatric: He has a normal mood and affect. His behavior is normal. Judgment and thought content normal.   Neck - sensitive w/ROM Obese  Lab Results  Component Value Date   WBC 7.5 06/26/2017   HGB 15.2 06/26/2017  HCT 46.4 06/26/2017   PLT 263.0 06/26/2017   GLUCOSE 88 06/26/2017   CHOL 186 06/26/2017   TRIG 56.0 06/26/2017   HDL 51.40 06/26/2017   LDLCALC 124 (H) 06/26/2017   ALT 27 06/26/2017   AST 24 06/26/2017   NA 140 06/26/2017   K 4.0 06/26/2017   CL 109 06/26/2017   CREATININE 0.78 06/26/2017   BUN 11 06/26/2017   CO2 26 06/26/2017   TSH 1.52 06/26/2017   INR  1.08 08/23/2010    Dg Chest 2 View  Result Date: 09/21/2017 CLINICAL DATA:  Cough and congestion.  Fever EXAM: CHEST  2 VIEW COMPARISON:  None. FINDINGS: Normal mediastinum and cardiac silhouette. Normal pulmonary vasculature. No evidence of effusion, infiltrate, or pneumothorax. No acute bony abnormality. IMPRESSION: No acute cardiopulmonary process. Electronically Signed   By: Suzy Bouchard M.D.   On: 09/21/2017 13:51    Assessment & Plan:   There are no diagnoses linked to this encounter. I have discontinued Nelda Severe. Showman's naproxen and Vitamin D3. I am also having him maintain his sildenafil.  No orders of the defined types were placed in this encounter.    Follow-up: No follow-ups on file.  Walker Kehr, MD

## 2018-03-12 NOTE — Patient Instructions (Signed)
Memory foam contour pillow

## 2018-07-16 ENCOUNTER — Ambulatory Visit: Payer: BLUE CROSS/BLUE SHIELD | Admitting: Internal Medicine

## 2018-07-23 ENCOUNTER — Ambulatory Visit: Payer: BLUE CROSS/BLUE SHIELD | Admitting: Internal Medicine

## 2018-08-19 ENCOUNTER — Other Ambulatory Visit: Payer: Self-pay | Admitting: Internal Medicine

## 2018-11-05 ENCOUNTER — Ambulatory Visit: Payer: BLUE CROSS/BLUE SHIELD | Admitting: Internal Medicine

## 2018-12-17 ENCOUNTER — Other Ambulatory Visit (INDEPENDENT_AMBULATORY_CARE_PROVIDER_SITE_OTHER): Payer: BLUE CROSS/BLUE SHIELD

## 2018-12-17 ENCOUNTER — Ambulatory Visit (INDEPENDENT_AMBULATORY_CARE_PROVIDER_SITE_OTHER): Payer: BLUE CROSS/BLUE SHIELD | Admitting: Internal Medicine

## 2018-12-17 ENCOUNTER — Encounter: Payer: Self-pay | Admitting: Internal Medicine

## 2018-12-17 VITALS — BP 114/72 | HR 65 | Temp 98.2°F | Ht 72.0 in | Wt 220.0 lb

## 2018-12-17 DIAGNOSIS — Z23 Encounter for immunization: Secondary | ICD-10-CM

## 2018-12-17 DIAGNOSIS — Z0000 Encounter for general adult medical examination without abnormal findings: Secondary | ICD-10-CM

## 2018-12-17 DIAGNOSIS — M545 Low back pain, unspecified: Secondary | ICD-10-CM

## 2018-12-17 DIAGNOSIS — K589 Irritable bowel syndrome without diarrhea: Secondary | ICD-10-CM

## 2018-12-17 DIAGNOSIS — D170 Benign lipomatous neoplasm of skin and subcutaneous tissue of head, face and neck: Secondary | ICD-10-CM

## 2018-12-17 DIAGNOSIS — G8929 Other chronic pain: Secondary | ICD-10-CM

## 2018-12-17 LAB — CBC WITH DIFFERENTIAL/PLATELET
BASOS PCT: 0.8 % (ref 0.0–3.0)
Basophils Absolute: 0.1 10*3/uL (ref 0.0–0.1)
EOS ABS: 0.3 10*3/uL (ref 0.0–0.7)
Eosinophils Relative: 4.1 % (ref 0.0–5.0)
HEMATOCRIT: 45.3 % (ref 39.0–52.0)
Hemoglobin: 15 g/dL (ref 13.0–17.0)
LYMPHS ABS: 3.4 10*3/uL (ref 0.7–4.0)
LYMPHS PCT: 46 % (ref 12.0–46.0)
MCHC: 33.1 g/dL (ref 30.0–36.0)
MCV: 80.1 fl (ref 78.0–100.0)
MONO ABS: 0.8 10*3/uL (ref 0.1–1.0)
Monocytes Relative: 11 % (ref 3.0–12.0)
Neutro Abs: 2.8 10*3/uL (ref 1.4–7.7)
Neutrophils Relative %: 38.1 % — ABNORMAL LOW (ref 43.0–77.0)
PLATELETS: 292 10*3/uL (ref 150.0–400.0)
RBC: 5.65 Mil/uL (ref 4.22–5.81)
RDW: 14.4 % (ref 11.5–15.5)
WBC: 7.3 10*3/uL (ref 4.0–10.5)

## 2018-12-17 LAB — HEPATIC FUNCTION PANEL
ALBUMIN: 4.4 g/dL (ref 3.5–5.2)
ALK PHOS: 81 U/L (ref 39–117)
ALT: 22 U/L (ref 0–53)
AST: 24 U/L (ref 0–37)
Bilirubin, Direct: 0.1 mg/dL (ref 0.0–0.3)
TOTAL PROTEIN: 7.1 g/dL (ref 6.0–8.3)
Total Bilirubin: 0.6 mg/dL (ref 0.2–1.2)

## 2018-12-17 LAB — VITAMIN D 25 HYDROXY (VIT D DEFICIENCY, FRACTURES): VITD: 15.57 ng/mL — ABNORMAL LOW (ref 30.00–100.00)

## 2018-12-17 LAB — URINALYSIS
Bilirubin Urine: NEGATIVE
Hgb urine dipstick: NEGATIVE
KETONES UR: NEGATIVE
Leukocytes, UA: NEGATIVE
Nitrite: NEGATIVE
SPECIFIC GRAVITY, URINE: 1.02 (ref 1.000–1.030)
Total Protein, Urine: NEGATIVE
UROBILINOGEN UA: 1 (ref 0.0–1.0)
Urine Glucose: NEGATIVE
pH: 7 (ref 5.0–8.0)

## 2018-12-17 LAB — BASIC METABOLIC PANEL
BUN: 12 mg/dL (ref 6–23)
CHLORIDE: 108 meq/L (ref 96–112)
CO2: 28 mEq/L (ref 19–32)
CREATININE: 0.86 mg/dL (ref 0.40–1.50)
Calcium: 9.5 mg/dL (ref 8.4–10.5)
GFR: 119.15 mL/min (ref >60.00)
Glucose, Bld: 81 mg/dL (ref 70–99)
Potassium: 4.4 mEq/L (ref 3.5–5.1)
Sodium: 143 mEq/L (ref 135–145)

## 2018-12-17 LAB — LIPID PANEL
CHOLESTEROL: 170 mg/dL (ref 0–200)
HDL: 47.5 mg/dL (ref >39.00)
LDL CALC: 110 mg/dL — AB (ref 0–99)
NonHDL: 122.33
TRIGLYCERIDES: 62 mg/dL (ref 0.0–149.0)
Total CHOL/HDL Ratio: 4
VLDL: 12.4 mg/dL (ref 0.0–40.0)

## 2018-12-17 LAB — TSH: TSH: 1.47 u[IU]/mL (ref 0.35–4.50)

## 2018-12-17 MED ORDER — ALIGN 4 MG PO CAPS: 1.0000 | ORAL_CAPSULE | Freq: Every day | ORAL | 11 refills | Status: DC

## 2018-12-17 NOTE — Progress Notes (Signed)
Subjective:  Patient ID: Chad Spears, male    DOB: [birth year]  Age: 38 y.o. MRN: [id number]  CC: No chief complaint on file.   HPI Chad Spears presents for a well exam  Vit D def,IBS, ED f/u  Outpatient Medications Prior to Visit  Medication Sig Dispense Refill  . Cholecalciferol (VITAMIN D3) 2000 units capsule Take 1 capsule (2,000 Units total) by mouth daily. 100 capsule 3  . sildenafil (REVATIO) 20 MG tablet TAKE 1 TO 5 TABLETS BY MOUTH AS NEEDED 4 tablet 24   No facility-administered medications prior to visit.     ROS: Review of Systems  Constitutional: Negative for appetite change, fatigue and unexpected weight change.  HENT: Negative for congestion, nosebleeds, sneezing, sore throat and trouble swallowing.   Eyes: Negative for itching and visual disturbance.  Respiratory: Negative for cough.   Cardiovascular: Negative for chest pain, palpitations and leg swelling.  Gastrointestinal: Negative for abdominal distention, blood in stool, diarrhea and nausea.  Genitourinary: Negative for frequency and hematuria.  Musculoskeletal: Negative for back pain, gait problem, joint swelling and neck pain.  Skin: Negative for rash.  Neurological: Negative for dizziness, tremors, speech difficulty and weakness.  Psychiatric/Behavioral: Negative for agitation, dysphoric mood, sleep disturbance and suicidal ideas. The patient is not nervous/anxious.     Objective:  BP 114/72 (BP Location: Left Arm, Patient Position: Sitting, Cuff Size: Large)   Pulse 65   Temp 98.2 F (36.8 C) (Oral)   Ht 6' (1.829 m)   Wt 220 lb (99.8 kg)   SpO2 98%   BMI 29.84 kg/m   BP Readings from Last 3 Encounters:  12/17/18 114/72  03/12/18 112/76  11/06/17 110/72    Wt Readings from Last 3 Encounters:  12/17/18 220 lb (99.8 kg)  03/12/18 243 lb (110.2 kg)  11/06/17 240 lb (108.9 kg)    Physical Exam Constitutional:      General: He is not in acute distress.    Appearance: He is well-developed.      Comments: NAD  Eyes:     Conjunctiva/sclera: Conjunctivae normal.     Pupils: Pupils are equal, round, and reactive to light.  Neck:     Musculoskeletal: Normal range of motion.     Thyroid: No thyromegaly.     Vascular: No JVD.  Cardiovascular:     Rate and Rhythm: Normal rate and regular rhythm.     Heart sounds: Normal heart sounds. No murmur. No friction rub. No gallop.   Pulmonary:     Effort: Pulmonary effort is normal. No respiratory distress.     Breath sounds: Normal breath sounds. No wheezing or rales.  Chest:     Chest wall: No tenderness.  Abdominal:     General: Bowel sounds are normal. There is no distension.     Palpations: Abdomen is soft. There is no mass.     Tenderness: There is no abdominal tenderness. There is no guarding or rebound.  Musculoskeletal: Normal range of motion.        General: No tenderness.  Lymphadenopathy:     Cervical: No cervical adenopathy.  Skin:    General: Skin is warm and dry.     Findings: No rash.  Neurological:     Mental Status: He is alert and oriented to person, place, and time.     Cranial Nerves: No cranial nerve deficit.     Motor: No abnormal muscle tone.     Coordination: Coordination normal.  Gait: Gait normal.     Deep Tendon Reflexes: Reflexes are normal and symmetric.  Psychiatric:        Behavior: Behavior normal.        Thought Content: Thought content normal.        Judgment: Judgment normal.   large post neck lipoma  Lab Results  Component Value Date   WBC 7.5 06/26/2017   HGB 15.2 06/26/2017   HCT 46.4 06/26/2017   PLT 263.0 06/26/2017   GLUCOSE 88 06/26/2017   CHOL 186 06/26/2017   TRIG 56.0 06/26/2017   HDL 51.40 06/26/2017   LDLCALC 124 (H) 06/26/2017   ALT 27 06/26/2017   AST 24 06/26/2017   NA 140 06/26/2017   K 4.0 06/26/2017   CL 109 06/26/2017   CREATININE 0.78 06/26/2017   BUN 11 06/26/2017   CO2 26 06/26/2017   TSH 1.52 06/26/2017   INR 1.08 08/23/2010    Dg Chest 2  View  Result Date: 09/21/2017 CLINICAL DATA:  Cough and congestion.  Fever EXAM: CHEST  2 VIEW COMPARISON:  None. FINDINGS: Normal mediastinum and cardiac silhouette. Normal pulmonary vasculature. No evidence of effusion, infiltrate, or pneumothorax. No acute bony abnormality. IMPRESSION: No acute cardiopulmonary process. Electronically Signed   By: Suzy Bouchard M.D.   On: 09/21/2017 13:51    Assessment & Plan:   There are no diagnoses linked to this encounter.   No orders of the defined types were placed in this encounter.    Follow-up: No follow-ups on file.  Chad Spears

## 2018-12-17 NOTE — Assessment & Plan Note (Signed)
We discussed age appropriate health related issues, including available/recomended screening tests and vaccinations. We discussed a need for adhering to healthy diet and exercise. Labs were ordered to be later reviewed . All questions were answered.   

## 2018-12-17 NOTE — Assessment & Plan Note (Signed)
Gen surgery ref

## 2018-12-17 NOTE — Assessment & Plan Note (Signed)
Naproxen

## 2018-12-17 NOTE — Assessment & Plan Note (Signed)
Doing better.   

## 2018-12-18 ENCOUNTER — Other Ambulatory Visit: Payer: Self-pay | Admitting: Internal Medicine

## 2018-12-18 MED ORDER — VITAMIN D3 1.25 MG (50000 UT) PO CAPS: 1.0000 | ORAL_CAPSULE | ORAL | 0 refills | Status: DC

## 2018-12-18 MED ORDER — VITAMIN D3 50 MCG (2000 UT) PO CAPS: 2000.0000 [IU] | ORAL_CAPSULE | Freq: Every day | ORAL | 3 refills | Status: DC

## 2019-04-28 ENCOUNTER — Other Ambulatory Visit: Payer: Self-pay | Admitting: Internal Medicine

## 2019-06-17 ENCOUNTER — Ambulatory Visit: Payer: BLUE CROSS/BLUE SHIELD | Admitting: Internal Medicine

## 2019-07-22 ENCOUNTER — Ambulatory Visit: Payer: BLUE CROSS/BLUE SHIELD | Admitting: Internal Medicine

## 2019-08-04 ENCOUNTER — Other Ambulatory Visit: Payer: Self-pay | Admitting: Internal Medicine

## 2019-08-11 ENCOUNTER — Other Ambulatory Visit: Payer: Self-pay

## 2019-08-11 ENCOUNTER — Encounter: Payer: Self-pay | Admitting: Internal Medicine

## 2019-08-11 ENCOUNTER — Ambulatory Visit (INDEPENDENT_AMBULATORY_CARE_PROVIDER_SITE_OTHER): Payer: BC Managed Care – PPO | Admitting: Internal Medicine

## 2019-08-11 VITALS — BP 118/78 | HR 79 | Temp 98.4°F | Ht 72.0 in | Wt 229.0 lb

## 2019-08-11 DIAGNOSIS — Z6830 Body mass index (BMI) 30.0-30.9, adult: Secondary | ICD-10-CM

## 2019-08-11 DIAGNOSIS — K219 Gastro-esophageal reflux disease without esophagitis: Secondary | ICD-10-CM

## 2019-08-11 DIAGNOSIS — R12 Heartburn: Secondary | ICD-10-CM | POA: Diagnosis not present

## 2019-08-11 DIAGNOSIS — E669 Obesity, unspecified: Secondary | ICD-10-CM

## 2019-08-11 DIAGNOSIS — Z23 Encounter for immunization: Secondary | ICD-10-CM

## 2019-08-11 DIAGNOSIS — E6609 Other obesity due to excess calories: Secondary | ICD-10-CM

## 2019-08-11 MED ORDER — SILDENAFIL CITRATE 20 MG PO TABS: ORAL_TABLET | ORAL | 3 refills | Status: DC

## 2019-08-11 MED ORDER — VITAMIN D3 50 MCG (2000 UT) PO CAPS: 2000.0000 [IU] | ORAL_CAPSULE | Freq: Every day | ORAL | 3 refills | Status: DC

## 2019-08-11 MED ORDER — FAMOTIDINE 40 MG PO TABS: 40.0000 mg | ORAL_TABLET | Freq: Every day | ORAL | 3 refills | Status: DC

## 2019-08-11 NOTE — Assessment & Plan Note (Signed)
Omeprazole

## 2019-08-11 NOTE — Progress Notes (Signed)
Subjective:  Patient ID: Chad Spears, male    DOB: 1981-01-27  Age: 38 y.o. MRN: [id number]  CC: No chief complaint on file.   HPI Chad Spears presents for ED, GERD f/u C/o wt gain Needs Vit D  Outpatient Medications Prior to Visit  Medication Sig Dispense Refill  . Cholecalciferol (VITAMIN D3) 1.25 MG (50000 UT) CAPS Take 1 capsule by mouth once a week. 8 capsule 0  . Cholecalciferol (VITAMIN D3) 50 MCG (2000 UT) capsule Take 1 capsule (2,000 Units total) by mouth daily. 100 capsule 3  . Probiotic Product (ALIGN) 4 MG CAPS Take 1 capsule (4 mg total) by mouth daily. 30 capsule 11  . sildenafil (REVATIO) 20 MG tablet TAKE 1 TO 5 TABLETS BY MOUTH AS NEEDED 30 tablet 0   No facility-administered medications prior to visit.     ROS: Review of Systems  Constitutional: Positive for unexpected weight change. Negative for appetite change and fatigue.  HENT: Negative for congestion, nosebleeds, sneezing, sore throat and trouble swallowing.   Eyes: Negative for itching and visual disturbance.  Respiratory: Negative for cough.   Cardiovascular: Negative for chest pain, palpitations and leg swelling.  Gastrointestinal: Negative for abdominal distention, blood in stool, diarrhea and nausea.  Genitourinary: Negative for frequency and hematuria.  Musculoskeletal: Negative for back pain, gait problem, joint swelling and neck pain.  Skin: Negative for rash.  Neurological: Negative for dizziness, tremors, speech difficulty and weakness.  Psychiatric/Behavioral: Negative for agitation, dysphoric mood, sleep disturbance and suicidal ideas. The patient is not nervous/anxious.     Objective:  BP 118/78 (BP Location: Left Arm, Patient Position: Sitting, Cuff Size: Large)   Pulse 79   Temp 98.4 F (36.9 C) (Oral)   Ht 6' (1.829 m)   Wt 229 lb (103.9 kg)   SpO2 99%   BMI 31.06 kg/m   BP Readings from Last 3 Encounters:  08/11/19 118/78  12/17/18 114/72  03/12/18 112/76    Wt Readings  from Last 3 Encounters:  08/11/19 229 lb (103.9 kg)  12/17/18 220 lb (99.8 kg)  03/12/18 243 lb (110.2 kg)    Physical Exam Constitutional:      General: He is not in acute distress.    Appearance: He is well-developed.     Comments: NAD  Eyes:     Conjunctiva/sclera: Conjunctivae normal.     Pupils: Pupils are equal, round, and reactive to light.  Neck:     Musculoskeletal: Normal range of motion.     Thyroid: No thyromegaly.     Vascular: No JVD.  Cardiovascular:     Rate and Rhythm: Normal rate and regular rhythm.     Heart sounds: Normal heart sounds. No murmur. No friction rub. No gallop.   Pulmonary:     Effort: Pulmonary effort is normal. No respiratory distress.     Breath sounds: Normal breath sounds. No wheezing or rales.  Chest:     Chest wall: No tenderness.  Abdominal:     General: Bowel sounds are normal. There is no distension.     Palpations: Abdomen is soft. There is no mass.     Tenderness: There is no abdominal tenderness. There is no guarding or rebound.  Musculoskeletal: Normal range of motion.        General: No tenderness.  Lymphadenopathy:     Cervical: No cervical adenopathy.  Skin:    General: Skin is warm and dry.     Findings: No rash.  Neurological:  Mental Status: He is alert and oriented to person, place, and time.     Cranial Nerves: No cranial nerve deficit.     Motor: No abnormal muscle tone.     Coordination: Coordination normal.     Gait: Gait normal.     Deep Tendon Reflexes: Reflexes are normal and symmetric.  Psychiatric:        Behavior: Behavior normal.        Thought Content: Thought content normal.        Judgment: Judgment normal.     Lab Results  Component Value Date   WBC 7.3 12/17/2018   HGB 15.0 12/17/2018   HCT 45.3 12/17/2018   PLT 292.0 12/17/2018   GLUCOSE 81 12/17/2018   CHOL 170 12/17/2018   TRIG 62.0 12/17/2018   HDL 47.50 12/17/2018   LDLCALC 110 (H) 12/17/2018   ALT 22 12/17/2018   AST 24  12/17/2018   NA 143 12/17/2018   K 4.4 12/17/2018   CL 108 12/17/2018   CREATININE 0.86 12/17/2018   BUN 12 12/17/2018   CO2 28 12/17/2018   TSH 1.47 12/17/2018   INR 1.08 08/23/2010    Dg Chest 2 View  Result Date: 09/21/2017 CLINICAL DATA:  Cough and congestion.  Fever EXAM: CHEST  2 VIEW COMPARISON:  None. FINDINGS: Normal mediastinum and cardiac silhouette. Normal pulmonary vasculature. No evidence of effusion, infiltrate, or pneumothorax. No acute bony abnormality. IMPRESSION: No acute cardiopulmonary process. Electronically Signed   By: Suzy Bouchard M.D.   On: 09/21/2017 13:51    Assessment & Plan:   There are no diagnoses linked to this encounter.   No orders of the defined types were placed in this encounter.    Follow-up: No follow-ups on file.  Walker Kehr, MD

## 2019-08-11 NOTE — Assessment & Plan Note (Signed)
On diet Running, gym

## 2019-08-11 NOTE — Assessment & Plan Note (Signed)
BMI 29.8 Diet

## 2019-08-11 NOTE — Assessment & Plan Note (Signed)
Pepcid po 

## 2019-08-12 NOTE — Addendum Note (Signed)
Addended by: Karren Cobble on: 08/12/2019 10:53 AM   Modules accepted: Orders

## 2019-09-20 ENCOUNTER — Other Ambulatory Visit: Payer: Self-pay | Admitting: Internal Medicine

## 2019-09-21 ENCOUNTER — Telehealth: Payer: Self-pay | Admitting: Internal Medicine

## 2019-09-21 NOTE — Telephone Encounter (Signed)
Copied from [place] [id number]. Topic: General - Other >> Sep 21, 2019 11:26 AM Keene Breath wrote: Reason for CRM: Patient called to ask if his pending script could be changed to another pharmacy.  Please call to discuss at [phone]

## 2019-09-23 MED ORDER — SILDENAFIL CITRATE 20 MG PO TABS: ORAL_TABLET | ORAL | 1 refills | Status: DC

## 2019-09-23 NOTE — Addendum Note (Signed)
Addended by: Karren Cobble on: 09/23/2019 11:36 AM   Modules accepted: Orders

## 2019-09-23 NOTE — Telephone Encounter (Signed)
RX resent to new pharmacy

## 2019-11-09 ENCOUNTER — Ambulatory Visit: Payer: BC Managed Care – PPO | Admitting: Internal Medicine

## 2019-11-15 ENCOUNTER — Encounter: Payer: Self-pay | Admitting: Internal Medicine

## 2019-11-15 ENCOUNTER — Other Ambulatory Visit: Payer: Self-pay

## 2019-11-15 ENCOUNTER — Ambulatory Visit (INDEPENDENT_AMBULATORY_CARE_PROVIDER_SITE_OTHER): Payer: 59 | Admitting: Internal Medicine

## 2019-11-15 DIAGNOSIS — G8929 Other chronic pain: Secondary | ICD-10-CM | POA: Diagnosis not present

## 2019-11-15 DIAGNOSIS — M5416 Radiculopathy, lumbar region: Secondary | ICD-10-CM | POA: Diagnosis not present

## 2019-11-15 DIAGNOSIS — M545 Low back pain, unspecified: Secondary | ICD-10-CM

## 2019-11-15 MED ORDER — CYCLOBENZAPRINE HCL 5 MG PO TABS: 5.0000 mg | ORAL_TABLET | Freq: Three times a day (TID) | ORAL | 1 refills | Status: DC | PRN

## 2019-11-15 MED ORDER — METHYLPREDNISOLONE 4 MG PO TBPK: ORAL_TABLET | ORAL | 0 refills | Status: DC
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
## 2019-11-15 NOTE — Patient Instructions (Signed)

## 2019-11-29 ENCOUNTER — Other Ambulatory Visit: Payer: Self-pay

## 2019-11-29 ENCOUNTER — Ambulatory Visit (INDEPENDENT_AMBULATORY_CARE_PROVIDER_SITE_OTHER)
Admission: RE | Admit: 2019-11-29 | Discharge: 2019-11-29 | Disposition: A | Discharge status: Home / Self Care | Payer: 59 | Source: Ambulatory Visit | Attending: Internal Medicine | Admitting: Internal Medicine

## 2019-11-29 ENCOUNTER — Ambulatory Visit (INDEPENDENT_AMBULATORY_CARE_PROVIDER_SITE_OTHER): Payer: 59 | Admitting: Internal Medicine

## 2019-11-29 ENCOUNTER — Encounter: Payer: Self-pay | Admitting: Internal Medicine

## 2019-11-29 VITALS — BP 126/74 | HR 82 | Temp 98.4°F | Ht 72.0 in | Wt 234.0 lb

## 2019-11-29 DIAGNOSIS — M545 Low back pain, unspecified: Secondary | ICD-10-CM

## 2019-11-29 DIAGNOSIS — G8929 Other chronic pain: Secondary | ICD-10-CM

## 2019-11-29 DIAGNOSIS — M5416 Radiculopathy, lumbar region: Secondary | ICD-10-CM

## 2019-11-29 NOTE — Assessment & Plan Note (Signed)
Probable LS X ray Start PT

## 2019-11-29 NOTE — Assessment & Plan Note (Signed)
MSK strain pain - not better... LS x ray today Start PT RTC 6 weeks
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
## 2020-01-07 ENCOUNTER — Other Ambulatory Visit: Payer: Self-pay | Admitting: Internal Medicine

## 2020-01-10 ENCOUNTER — Other Ambulatory Visit: Payer: Self-pay

## 2020-01-10 ENCOUNTER — Ambulatory Visit (INDEPENDENT_AMBULATORY_CARE_PROVIDER_SITE_OTHER): Payer: BC Managed Care – PPO | Admitting: Internal Medicine

## 2020-01-10 ENCOUNTER — Encounter: Payer: Self-pay | Admitting: Internal Medicine

## 2020-01-10 DIAGNOSIS — M545 Low back pain, unspecified: Secondary | ICD-10-CM

## 2020-01-10 DIAGNOSIS — M542 Cervicalgia: Secondary | ICD-10-CM | POA: Diagnosis not present

## 2020-01-10 DIAGNOSIS — G8929 Other chronic pain: Secondary | ICD-10-CM | POA: Diagnosis not present

## 2020-01-10 MED ORDER — MELOXICAM 15 MG PO TABS: 15.0000 mg | ORAL_TABLET | Freq: Every day | ORAL | 1 refills | Status: DC

## 2020-01-10 MED ORDER — CYCLOBENZAPRINE HCL 5 MG PO TABS: 5.0000 mg | ORAL_TABLET | Freq: Three times a day (TID) | ORAL | 1 refills | Status: DC | PRN
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
## 2020-01-10 NOTE — Assessment & Plan Note (Signed)
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

## 2020-01-10 NOTE — Assessment & Plan Note (Signed)
  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

## 2020-02-09 ENCOUNTER — Encounter: Payer: BC Managed Care – PPO | Admitting: Internal Medicine

## 2020-03-15 ENCOUNTER — Other Ambulatory Visit: Payer: Self-pay | Admitting: Internal Medicine

## 2020-03-21 ENCOUNTER — Other Ambulatory Visit: Payer: Self-pay

## 2020-03-21 ENCOUNTER — Ambulatory Visit (INDEPENDENT_AMBULATORY_CARE_PROVIDER_SITE_OTHER): Payer: BC Managed Care – PPO | Admitting: Internal Medicine

## 2020-03-21 ENCOUNTER — Encounter: Payer: Self-pay | Admitting: Internal Medicine

## 2020-03-21 DIAGNOSIS — Z0000 Encounter for general adult medical examination without abnormal findings: Secondary | ICD-10-CM | POA: Diagnosis not present

## 2020-03-21 LAB — CBC WITH DIFFERENTIAL/PLATELET
Basophils Absolute: 0.1 10*3/uL (ref 0.0–0.1)
Basophils Relative: 1.4 % (ref 0.0–3.0)
Eosinophils Absolute: 0.6 10*3/uL (ref 0.0–0.7)
Eosinophils Relative: 5.8 % — ABNORMAL HIGH (ref 0.0–5.0)
HCT: 44.5 % (ref 39.0–52.0)
Hemoglobin: 14.8 g/dL (ref 13.0–17.0)
Lymphocytes Relative: 34.8 % (ref 12.0–46.0)
Lymphs Abs: 3.3 10*3/uL (ref 0.7–4.0)
MCHC: 33.3 g/dL (ref 30.0–36.0)
MCV: 81 fl (ref 78.0–100.0)
Monocytes Absolute: 0.9 10*3/uL (ref 0.1–1.0)
Monocytes Relative: 8.9 % (ref 3.0–12.0)
Neutro Abs: 4.7 10*3/uL (ref 1.4–7.7)
Neutrophils Relative %: 49.1 % (ref 43.0–77.0)
Platelets: 303 10*3/uL (ref 150.0–400.0)
RBC: 5.49 Mil/uL (ref 4.22–5.81)
RDW: 14.6 % (ref 11.5–15.5)
WBC: 9.6 10*3/uL (ref 4.0–10.5)

## 2020-03-21 LAB — HEPATIC FUNCTION PANEL
ALT: 32 U/L (ref 0–53)
AST: 29 U/L (ref 0–37)
Albumin: 4.5 g/dL (ref 3.5–5.2)
Alkaline Phosphatase: 95 U/L (ref 39–117)
Bilirubin, Direct: 0.1 mg/dL (ref 0.0–0.3)
Total Bilirubin: 0.4 mg/dL (ref 0.2–1.2)
Total Protein: 7.4 g/dL (ref 6.0–8.3)

## 2020-03-21 LAB — BASIC METABOLIC PANEL
BUN: 13 mg/dL (ref 6–23)
CO2: 28 mEq/L (ref 19–32)
Calcium: 9.8 mg/dL (ref 8.4–10.5)
Chloride: 104 mEq/L (ref 96–112)
Creatinine, Ser: 0.73 mg/dL (ref 0.40–1.50)
GFR: 143.05 mL/min (ref >60.00)
Glucose, Bld: 92 mg/dL (ref 70–99)
Potassium: 4 mEq/L (ref 3.5–5.1)
Sodium: 139 mEq/L (ref 135–145)

## 2020-03-21 LAB — LIPID PANEL
Cholesterol: 197 mg/dL (ref 0–200)
HDL: 47.8 mg/dL (ref >39.00)
LDL Cholesterol: 125 mg/dL — ABNORMAL HIGH (ref 0–99)
NonHDL: 149.32
Total CHOL/HDL Ratio: 4
Triglycerides: 122 mg/dL (ref 0.0–149.0)
VLDL: 24.4 mg/dL (ref 0.0–40.0)

## 2020-03-21 LAB — TSH: TSH: 2.2 u[IU]/mL (ref 0.35–4.50)

## 2020-03-21 LAB — PSA: PSA: 0.55 ng/mL (ref 0.10–4.00)

## 2020-03-21 LAB — VITAMIN D 25 HYDROXY (VIT D DEFICIENCY, FRACTURES): VITD: 20.43 ng/mL — ABNORMAL LOW (ref 30.00–100.00)

## 2020-03-21 MED ORDER — SILDENAFIL CITRATE 20 MG PO TABS: ORAL_TABLET | ORAL | 5 refills | Status: DC

## 2020-03-21 NOTE — Addendum Note (Signed)
Addended by: Cresenciano Lick on: 03/21/2020 02:32 PM   Modules accepted: Orders
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
## 2020-03-21 NOTE — Assessment & Plan Note (Signed)

## 2020-03-22 LAB — URINALYSIS
Bilirubin Urine: NEGATIVE
Hgb urine dipstick: NEGATIVE
Ketones, ur: NEGATIVE
Leukocytes,Ua: NEGATIVE
Nitrite: NEGATIVE
Specific Gravity, Urine: 1.025 (ref 1.000–1.030)
Total Protein, Urine: NEGATIVE
Urine Glucose: NEGATIVE
Urobilinogen, UA: 0.2 (ref 0.0–1.0)
pH: 6.5 (ref 5.0–8.0)

## 2020-03-27 ENCOUNTER — Other Ambulatory Visit: Payer: Self-pay | Admitting: Internal Medicine

## 2020-03-27 MED ORDER — VITAMIN D3 1.25 MG (50000 UT) PO CAPS: 1.0000 | ORAL_CAPSULE | ORAL | 0 refills | Status: DC

## 2020-03-28 ENCOUNTER — Telehealth: Payer: Self-pay

## 2020-03-28 NOTE — Telephone Encounter (Signed)
See lab result

## 2020-03-28 NOTE — Telephone Encounter (Signed)
New message    Calling for test results  

## 2020-03-29 ENCOUNTER — Ambulatory Visit: Payer: BC Managed Care – PPO | Admitting: Internal Medicine

## 2020-04-24 ENCOUNTER — Encounter (HOSPITAL_COMMUNITY): Payer: Self-pay

## 2020-04-24 ENCOUNTER — Emergency Department (HOSPITAL_COMMUNITY): Payer: BC Managed Care – PPO

## 2020-04-24 ENCOUNTER — Other Ambulatory Visit: Payer: Self-pay

## 2020-04-24 ENCOUNTER — Emergency Department (HOSPITAL_COMMUNITY)
Admission: EM | Admit: 2020-04-24 | Discharge: 2020-04-24 | Disposition: A | Discharge status: Home / Self Care | Payer: BC Managed Care – PPO | Attending: Emergency Medicine | Admitting: Emergency Medicine

## 2020-04-24 DIAGNOSIS — R079 Chest pain, unspecified: Secondary | ICD-10-CM | POA: Diagnosis not present

## 2020-04-24 DIAGNOSIS — R5383 Other fatigue: Secondary | ICD-10-CM | POA: Insufficient documentation

## 2020-04-24 DIAGNOSIS — Z888 Allergy status to other drugs, medicaments and biological substances status: Secondary | ICD-10-CM | POA: Insufficient documentation

## 2020-04-24 DIAGNOSIS — M791 Myalgia, unspecified site: Secondary | ICD-10-CM | POA: Diagnosis present

## 2020-04-24 LAB — CBC WITH DIFFERENTIAL/PLATELET
Abs Immature Granulocytes: 0.01 10*3/uL (ref 0.00–0.07)
Basophils Absolute: 0.1 10*3/uL (ref 0.0–0.1)
Basophils Relative: 2 %
Eosinophils Absolute: 0.6 10*3/uL — ABNORMAL HIGH (ref 0.0–0.5)
Eosinophils Relative: 8 %
HCT: 45.1 % (ref 39.0–52.0)
Hemoglobin: 14.7 g/dL (ref 13.0–17.0)
Immature Granulocytes: 0 %
Lymphocytes Relative: 40 %
Lymphs Abs: 3 10*3/uL (ref 0.7–4.0)
MCH: 26.3 pg (ref 26.0–34.0)
MCHC: 32.6 g/dL (ref 30.0–36.0)
MCV: 80.8 fL (ref 80.0–100.0)
Monocytes Absolute: 0.6 10*3/uL (ref 0.1–1.0)
Monocytes Relative: 8 %
Neutro Abs: 3.2 10*3/uL (ref 1.7–7.7)
Neutrophils Relative %: 42 %
Platelets: 280 10*3/uL (ref 150–400)
RBC: 5.58 MIL/uL (ref 4.22–5.81)
RDW: 14.2 % (ref 11.5–15.5)
WBC: 7.4 10*3/uL (ref 4.0–10.5)
nRBC: 0 % (ref 0.0–0.2)

## 2020-04-24 LAB — BASIC METABOLIC PANEL
Anion gap: 9 (ref 5–15)
BUN: 14 mg/dL (ref 6–20)
CO2: 25 mmol/L (ref 22–32)
Calcium: 9.2 mg/dL (ref 8.9–10.3)
Chloride: 108 mmol/L (ref 98–111)
Creatinine, Ser: 0.63 mg/dL (ref 0.61–1.24)
GFR calc Af Amer: >60 mL/min (ref >60)
GFR calc non Af Amer: >60 mL/min (ref >60)
Glucose, Bld: 96 mg/dL (ref 70–99)
Potassium: 4.2 mmol/L (ref 3.5–5.1)
Sodium: 142 mmol/L (ref 135–145)

## 2020-04-24 LAB — TROPONIN I (HIGH SENSITIVITY): Troponin I (High Sensitivity): 2 ng/L (ref <18)
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
## 2020-04-24 NOTE — ED Triage Notes (Signed)
Patient c/o generalized body aches and fatigue since taking the Moderna vaccine on May 29.

## 2020-04-24 NOTE — Discharge Instructions (Addendum)
You can take Tylenol or Motrin as needed to help with your pain. Follow-up with your primary care provider. Return to the ER for worsening chest pain, shortness of breath, leg swelling, uncontrollable vomiting.

## 2020-04-25 ENCOUNTER — Emergency Department (HOSPITAL_COMMUNITY)
Admission: EM | Admit: 2020-04-25 | Discharge: 2020-04-25 | Disposition: A | Discharge status: Home / Self Care | Payer: BC Managed Care – PPO | Attending: Emergency Medicine | Admitting: Emergency Medicine

## 2020-04-25 ENCOUNTER — Other Ambulatory Visit: Payer: Self-pay

## 2020-04-25 ENCOUNTER — Emergency Department (HOSPITAL_COMMUNITY): Payer: BC Managed Care – PPO

## 2020-04-25 ENCOUNTER — Encounter (HOSPITAL_COMMUNITY): Payer: Self-pay | Admitting: Emergency Medicine

## 2020-04-25 DIAGNOSIS — R1013 Epigastric pain: Secondary | ICD-10-CM | POA: Diagnosis not present

## 2020-04-25 DIAGNOSIS — R112 Nausea with vomiting, unspecified: Secondary | ICD-10-CM

## 2020-04-25 LAB — COMPREHENSIVE METABOLIC PANEL
ALT: 36 U/L (ref 0–44)
AST: 32 U/L (ref 15–41)
Albumin: 3.8 g/dL (ref 3.5–5.0)
Alkaline Phosphatase: 77 U/L (ref 38–126)
Anion gap: 10 (ref 5–15)
BUN: 13 mg/dL (ref 6–20)
CO2: 22 mmol/L (ref 22–32)
Calcium: 9.1 mg/dL (ref 8.9–10.3)
Chloride: 109 mmol/L (ref 98–111)
Creatinine, Ser: 0.75 mg/dL (ref 0.61–1.24)
GFR calc Af Amer: >60 mL/min (ref >60)
GFR calc non Af Amer: >60 mL/min (ref >60)
Glucose, Bld: 106 mg/dL — ABNORMAL HIGH (ref 70–99)
Potassium: 3.8 mmol/L (ref 3.5–5.1)
Sodium: 141 mmol/L (ref 135–145)
Total Bilirubin: 0.8 mg/dL (ref 0.3–1.2)
Total Protein: 6.9 g/dL (ref 6.5–8.1)

## 2020-04-25 LAB — URINALYSIS, ROUTINE W REFLEX MICROSCOPIC
Bilirubin Urine: NEGATIVE
Glucose, UA: NEGATIVE mg/dL
Hgb urine dipstick: NEGATIVE
Ketones, ur: NEGATIVE mg/dL
Leukocytes,Ua: NEGATIVE
Nitrite: NEGATIVE
Protein, ur: NEGATIVE mg/dL
Specific Gravity, Urine: 1.03 (ref 1.005–1.030)
pH: 5 (ref 5.0–8.0)

## 2020-04-25 LAB — CBC WITH DIFFERENTIAL/PLATELET
Abs Immature Granulocytes: 0.02 10*3/uL (ref 0.00–0.07)
Basophils Absolute: 0.1 10*3/uL (ref 0.0–0.1)
Basophils Relative: 2 %
Eosinophils Absolute: 0.8 10*3/uL — ABNORMAL HIGH (ref 0.0–0.5)
Eosinophils Relative: 9 %
HCT: 44.3 % (ref 39.0–52.0)
Hemoglobin: 14.5 g/dL (ref 13.0–17.0)
Immature Granulocytes: 0 %
Lymphocytes Relative: 40 %
Lymphs Abs: 3.5 10*3/uL (ref 0.7–4.0)
MCH: 26.3 pg (ref 26.0–34.0)
MCHC: 32.7 g/dL (ref 30.0–36.0)
MCV: 80.3 fL (ref 80.0–100.0)
Monocytes Absolute: 0.7 10*3/uL (ref 0.1–1.0)
Monocytes Relative: 8 %
Neutro Abs: 3.6 10*3/uL (ref 1.7–7.7)
Neutrophils Relative %: 41 %
Platelets: 279 10*3/uL (ref 150–400)
RBC: 5.52 MIL/uL (ref 4.22–5.81)
RDW: 13.8 % (ref 11.5–15.5)
WBC: 8.7 10*3/uL (ref 4.0–10.5)
nRBC: 0 % (ref 0.0–0.2)

## 2020-04-25 LAB — LIPASE, BLOOD: Lipase: 28 U/L (ref 11–51)

## 2020-04-25 MED ORDER — SODIUM CHLORIDE 0.9 % IV BOLUS
1000.0000 mL | Freq: Once | INTRAVENOUS | Status: AC
  Administered 2020-04-25: 1000 mL via INTRAVENOUS

## 2020-04-25 MED ORDER — PROMETHAZINE HCL 25 MG PO TABS
25.0000 mg | ORAL_TABLET | Freq: Four times a day (QID) | ORAL | 0 refills | Status: DC | PRN
Start: 2020-04-25 — End: 2020-09-21

## 2020-04-25 MED ORDER — PROMETHAZINE HCL 25 MG/ML IJ SOLN
12.5000 mg | Freq: Once | INTRAMUSCULAR | Status: AC
  Administered 2020-04-25: 12.5 mg via INTRAVENOUS
  Filled 2020-04-25: qty 1
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
## 2020-04-25 NOTE — Discharge Instructions (Signed)
Take phenergan for nausea   Stay hydrated   See your doctor   Return to ER if you have worse nausea, vomiting, abdominal pain.

## 2020-04-25 NOTE — ED Triage Notes (Signed)
Pt. Stated, Im having N/V started 2 weeks ago. Went to [facility] yesterday and gave me medicine and no better.

## 2020-05-01 ENCOUNTER — Ambulatory Visit: Payer: BC Managed Care – PPO | Admitting: Internal Medicine

## 2020-05-01 ENCOUNTER — Other Ambulatory Visit: Payer: Self-pay

## 2020-05-18 ENCOUNTER — Ambulatory Visit: Payer: BC Managed Care – PPO | Admitting: Internal Medicine

## 2020-06-15 ENCOUNTER — Ambulatory Visit: Payer: BC Managed Care – PPO | Admitting: Internal Medicine

## 2020-09-21 ENCOUNTER — Other Ambulatory Visit: Payer: Self-pay

## 2020-09-21 ENCOUNTER — Encounter: Payer: Self-pay | Admitting: Internal Medicine

## 2020-09-21 ENCOUNTER — Ambulatory Visit: Payer: BC Managed Care – PPO | Admitting: Internal Medicine

## 2020-09-21 DIAGNOSIS — M542 Cervicalgia: Secondary | ICD-10-CM | POA: Diagnosis not present

## 2020-09-21 DIAGNOSIS — Z23 Encounter for immunization: Secondary | ICD-10-CM | POA: Diagnosis not present

## 2020-09-21 DIAGNOSIS — M545 Low back pain, unspecified: Secondary | ICD-10-CM | POA: Diagnosis not present

## 2020-09-21 DIAGNOSIS — R5382 Chronic fatigue, unspecified: Secondary | ICD-10-CM

## 2020-09-21 DIAGNOSIS — R5383 Other fatigue: Secondary | ICD-10-CM | POA: Insufficient documentation

## 2020-09-21 DIAGNOSIS — G8929 Other chronic pain: Secondary | ICD-10-CM

## 2020-09-21 NOTE — Patient Instructions (Addendum)
Ginseng and Vitamin B complex for energy

## 2020-09-21 NOTE — Assessment & Plan Note (Signed)
Worse Take time off work [employer]

## 2020-09-21 NOTE — Assessment & Plan Note (Signed)
Worse [employer]

## 2020-09-21 NOTE — Assessment & Plan Note (Addendum)
working 2 jobs 7 d/wk  Ginseng and Vitamin B complex for energy

## 2020-09-21 NOTE — Progress Notes (Signed)
Subjective:  Patient ID: Chad Spears, male    DOB: 05/21/1981  Age: 39 y.o. MRN: [id number]  CC: Diabetes (6 MONTH F/U)   HPI Chad Spears presents for GERD, neck pain -- severe, worse, ED C/o fatigue COVID19 vaccinated C/o fatigue - working 2 jobs 7 d/wk    Outpatient Medications Prior to Visit  Medication Sig Dispense Refill   Cholecalciferol (VITAMIN D3) 50 MCG (2000 UT) capsule Take 1 capsule (2,000 Units total) by mouth daily. 100 capsule 3   sildenafil (REVATIO) 20 MG tablet TAKE 1 TO 5 TABLETS BY MOUTH AS NEEDED 60 tablet 5   Cholecalciferol (VITAMIN D3) 1.25 MG (50000 UT) CAPS Take 1 capsule by mouth once a week. (Patient not taking: Reported on 09/21/2020) 8 capsule 0   famotidine (PEPCID) 40 MG tablet Take 1 tablet (40 mg total) by mouth daily. (Patient not taking: Reported on 03/21/2020) 90 tablet 3   meloxicam (MOBIC) 15 MG tablet Take 1 tablet (15 mg total) by mouth daily. (Patient not taking: Reported on 03/21/2020) 30 tablet 1   Probiotic Product (ALIGN) 4 MG CAPS Take 1 capsule (4 mg total) by mouth daily. (Patient not taking: Reported on 03/21/2020) 30 capsule 11   promethazine (PHENERGAN) 25 MG tablet Take 1 tablet (25 mg total) by mouth every 6 (six) hours as needed for nausea or vomiting. 10 tablet 0   No facility-administered medications prior to visit.    ROS: Review of Systems  Constitutional: Positive for fatigue. Negative for appetite change and unexpected weight change.  HENT: Negative for congestion, nosebleeds, sneezing, sore throat and trouble swallowing.   Eyes: Negative for itching and visual disturbance.  Respiratory: Negative for cough.   Cardiovascular: Negative for chest pain, palpitations and leg swelling.  Gastrointestinal: Negative for abdominal distention, blood in stool, diarrhea and nausea.  Genitourinary: Negative for frequency and hematuria.  Musculoskeletal: Positive for neck pain and neck stiffness. Negative for back pain, gait  problem and joint swelling.  Skin: Negative for rash.  Neurological: Negative for dizziness, tremors, speech difficulty and weakness.  Psychiatric/Behavioral: Negative for agitation, dysphoric mood and sleep disturbance. The patient is not nervous/anxious.     Objective:  BP 122/72 (BP Location: Left Arm)    Pulse 74    Temp 98.5 F (36.9 C) (Oral)    Wt 230 lb 12.8 oz (104.7 kg)    SpO2 98%    BMI 31.30 kg/m   BP Readings from Last 3 Encounters:  09/21/20 122/72  04/25/20 (!) 110/59  04/24/20 119/81    Wt Readings from Last 3 Encounters:  09/21/20 230 lb 12.8 oz (104.7 kg)  04/24/20 205 lb (93 kg)  03/21/20 240 lb (108.9 kg)    Physical Exam Constitutional:      General: He is not in acute distress.    Appearance: He is well-developed. He is obese.     Comments: NAD  Eyes:     Conjunctiva/sclera: Conjunctivae normal.     Pupils: Pupils are equal, round, and reactive to light.  Neck:     Thyroid: No thyromegaly.     Vascular: No JVD.  Cardiovascular:     Rate and Rhythm: Normal rate and regular rhythm.     Heart sounds: Normal heart sounds. No murmur heard.  No friction rub. No gallop.   Pulmonary:     Effort: Pulmonary effort is normal. No respiratory distress.     Breath sounds: Normal breath sounds. No wheezing or rales.  Chest:  Chest wall: No tenderness.  Abdominal:     General: Bowel sounds are normal. There is no distension.     Palpations: Abdomen is soft. There is no mass.     Tenderness: There is no abdominal tenderness. There is no guarding or rebound.  Musculoskeletal:        General: Tenderness present. Normal range of motion.     Cervical back: Normal range of motion.  Lymphadenopathy:     Cervical: No cervical adenopathy.  Skin:    General: Skin is warm and dry.     Findings: No rash.  Neurological:     Mental Status: He is alert and oriented to person, place, and time.     Cranial Nerves: No cranial nerve deficit.     Motor: No abnormal  muscle tone.     Coordination: Coordination normal.     Gait: Gait normal.     Deep Tendon Reflexes: Reflexes are normal and symmetric.  Psychiatric:        Behavior: Behavior normal.        Thought Content: Thought content normal.        Judgment: Judgment normal.    Neck - tender w/ROM Lab Results  Component Value Date   WBC 8.7 04/25/2020   HGB 14.5 04/25/2020   HCT 44.3 04/25/2020   PLT 279 04/25/2020   GLUCOSE 106 (H) 04/25/2020   CHOL 197 03/21/2020   TRIG 122.0 03/21/2020   HDL 47.80 03/21/2020   LDLCALC 125 (H) 03/21/2020   ALT 36 04/25/2020   AST 32 04/25/2020   NA 141 04/25/2020   K 3.8 04/25/2020   CL 109 04/25/2020   CREATININE 0.75 04/25/2020   BUN 13 04/25/2020   CO2 22 04/25/2020   TSH 2.20 03/21/2020   PSA 0.55 03/21/2020   INR 1.08 08/23/2010    CT Renal Stone Study  Result Date: 04/25/2020 CLINICAL DATA:  Nausea and vomiting for 2 weeks, flank pain EXAM: CT ABDOMEN AND PELVIS WITHOUT CONTRAST TECHNIQUE: Multidetector CT imaging of the abdomen and pelvis was performed following the standard protocol without IV contrast. COMPARISON:  09/05/2011 FINDINGS: Lower chest: No acute pleural or parenchymal lung disease. Hepatobiliary: No focal liver abnormality is seen. No gallstones, gallbladder wall thickening, or biliary dilatation. Pancreas: Unremarkable. No pancreatic ductal dilatation or surrounding inflammatory changes. Spleen: Normal in size without focal abnormality. Adrenals/Urinary Tract: Adrenal glands are unremarkable. Kidneys are normal, without renal calculi, focal lesion, or hydronephrosis. Bladder is unremarkable. Stomach/Bowel: No bowel obstruction or ileus. Normal appendix right lower quadrant. No bowel wall thickening or inflammatory change. Vascular/Lymphatic: Multiple borderline enlarged lymph nodes are again seen within the pelvis and inguinal regions. Index lymph nodes are as follows: Right external iliac chain, image 85/3, 14 mm short axis.  Previously 9 mm. Right inguinal region, image 100/3, 14 mm.  Previously 12 mm. Left external iliac chain, image 91/3, 15 mm.  Previously 8 mm. No significant atherosclerosis. Reproductive: Prostate is unremarkable. Other: No abdominal wall hernia or abnormality. No abdominopelvic ascites. Musculoskeletal: No acute or destructive bony lesions. Reconstructed images demonstrate no additional findings. IMPRESSION: 1. Nonspecific pelvic lymphadenopathy.  Index lymph nodes as above. 2. Otherwise no acute intra-abdominal or intrapelvic process. 3. No urinary tract calculi or obstructive uropathy. Electronically Signed   By: Randa Ngo M.D.   On: 04/25/2020 19:33    Assessment & Plan:    Follow-up: No follow-ups on file.  Walker Kehr, MD

## 2020-09-29 ENCOUNTER — Telehealth (INDEPENDENT_AMBULATORY_CARE_PROVIDER_SITE_OTHER): Payer: BC Managed Care – PPO | Admitting: Family

## 2020-09-29 DIAGNOSIS — R112 Nausea with vomiting, unspecified: Secondary | ICD-10-CM

## 2020-09-29 DIAGNOSIS — R519 Headache, unspecified: Secondary | ICD-10-CM | POA: Diagnosis not present

## 2020-09-29 DIAGNOSIS — R42 Dizziness and giddiness: Secondary | ICD-10-CM

## 2020-09-29 MED ORDER — MECLIZINE HCL 25 MG PO TABS: 25.0000 mg | ORAL_TABLET | Freq: Three times a day (TID) | ORAL | 0 refills | Status: DC | PRN

## 2020-09-29 MED ORDER — PROMETHAZINE HCL 25 MG PO TABS
25.0000 mg | ORAL_TABLET | Freq: Three times a day (TID) | ORAL | 0 refills | Status: DC | PRN
Start: 2020-09-29 — End: 2021-04-25
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
## 2020-10-02 ENCOUNTER — Telehealth: Payer: Self-pay | Admitting: Internal Medicine

## 2020-10-02 ENCOUNTER — Other Ambulatory Visit: Payer: Self-pay | Admitting: Family

## 2020-10-02 NOTE — Telephone Encounter (Signed)
   Patient reports since last visit 11/19 with Valere Dross, he is not feeling better. Patient requesting work note for 11/19--> 11/23.  Patient states he plans to go to Urgent Care tomorrow if symptoms continue

## 2020-10-02 NOTE — Telephone Encounter (Signed)
Agree that he needs to be seen in person if symptoms persist;

## 2020-10-02 NOTE — Telephone Encounter (Signed)
    My is requesting letter be emailed to him, email on file He is not on MyChart

## 2021-04-12 ENCOUNTER — Other Ambulatory Visit: Payer: Self-pay | Admitting: Internal Medicine

## 2021-04-16 ENCOUNTER — Ambulatory Visit: Payer: BC Managed Care – PPO | Admitting: Internal Medicine

## 2021-04-25 ENCOUNTER — Encounter: Payer: Self-pay | Admitting: Internal Medicine

## 2021-04-25 ENCOUNTER — Other Ambulatory Visit: Payer: Self-pay

## 2021-04-25 ENCOUNTER — Ambulatory Visit (INDEPENDENT_AMBULATORY_CARE_PROVIDER_SITE_OTHER): Payer: 59 | Admitting: Internal Medicine

## 2021-04-25 VITALS — BP 102/72 | HR 43 | Temp 98.6°F | Ht 72.0 in | Wt 231.8 lb

## 2021-04-25 DIAGNOSIS — E559 Vitamin D deficiency, unspecified: Secondary | ICD-10-CM | POA: Diagnosis not present

## 2021-04-25 DIAGNOSIS — R5382 Chronic fatigue, unspecified: Secondary | ICD-10-CM | POA: Diagnosis not present

## 2021-04-25 DIAGNOSIS — Z0000 Encounter for general adult medical examination without abnormal findings: Secondary | ICD-10-CM

## 2021-04-25 DIAGNOSIS — N528 Other male erectile dysfunction: Secondary | ICD-10-CM

## 2021-04-25 LAB — COMPREHENSIVE METABOLIC PANEL
ALT: 36 U/L (ref 0–53)
AST: 32 U/L (ref 0–37)
Albumin: 4.4 g/dL (ref 3.5–5.2)
Alkaline Phosphatase: 92 U/L (ref 39–117)
BUN: 14 mg/dL (ref 6–23)
CO2: 26 mEq/L (ref 19–32)
Calcium: 9.4 mg/dL (ref 8.4–10.5)
Chloride: 103 mEq/L (ref 96–112)
Creatinine, Ser: 0.77 mg/dL (ref 0.40–1.50)
GFR: 110.51 mL/min (ref >60.00)
Glucose, Bld: 89 mg/dL (ref 70–99)
Potassium: 3.9 mEq/L (ref 3.5–5.1)
Sodium: 137 mEq/L (ref 135–145)
Total Bilirubin: 0.6 mg/dL (ref 0.2–1.2)
Total Protein: 7.4 g/dL (ref 6.0–8.3)

## 2021-04-25 LAB — URINALYSIS
Bilirubin Urine: NEGATIVE
Hgb urine dipstick: NEGATIVE
Ketones, ur: NEGATIVE
Leukocytes,Ua: NEGATIVE
Nitrite: NEGATIVE
Specific Gravity, Urine: >=1.03 — AB (ref 1.000–1.030)
Total Protein, Urine: NEGATIVE
Urine Glucose: NEGATIVE
Urobilinogen, UA: 0.2 (ref 0.0–1.0)
pH: 5.5 (ref 5.0–8.0)

## 2021-04-25 LAB — CBC WITH DIFFERENTIAL/PLATELET
Basophils Absolute: 0.1 10*3/uL (ref 0.0–0.1)
Basophils Relative: 1.5 % (ref 0.0–3.0)
Eosinophils Absolute: 0.4 10*3/uL (ref 0.0–0.7)
Eosinophils Relative: 4.6 % (ref 0.0–5.0)
HCT: 43.6 % (ref 39.0–52.0)
Hemoglobin: 14.4 g/dL (ref 13.0–17.0)
Lymphocytes Relative: 43 % (ref 12.0–46.0)
Lymphs Abs: 3.5 10*3/uL (ref 0.7–4.0)
MCHC: 33 g/dL (ref 30.0–36.0)
MCV: 80.3 fl (ref 78.0–100.0)
Monocytes Absolute: 0.6 10*3/uL (ref 0.1–1.0)
Monocytes Relative: 7.2 % (ref 3.0–12.0)
Neutro Abs: 3.6 10*3/uL (ref 1.4–7.7)
Neutrophils Relative %: 43.7 % (ref 43.0–77.0)
Platelets: 267 10*3/uL (ref 150.0–400.0)
RBC: 5.44 Mil/uL (ref 4.22–5.81)
RDW: 15 % (ref 11.5–15.5)
WBC: 8.2 10*3/uL (ref 4.0–10.5)

## 2021-04-25 LAB — LIPID PANEL
Cholesterol: 177 mg/dL (ref 0–200)
HDL: 51.2 mg/dL (ref >39.00)
LDL Cholesterol: 117 mg/dL — ABNORMAL HIGH (ref 0–99)
NonHDL: 125.39
Total CHOL/HDL Ratio: 3
Triglycerides: 41 mg/dL (ref 0.0–149.0)
VLDL: 8.2 mg/dL (ref 0.0–40.0)

## 2021-04-25 LAB — VITAMIN D 25 HYDROXY (VIT D DEFICIENCY, FRACTURES): VITD: 14.08 ng/mL — ABNORMAL LOW (ref 30.00–100.00)

## 2021-04-25 LAB — VITAMIN B12: Vitamin B-12: 427 pg/mL (ref 211–911)

## 2021-04-25 LAB — TSH: TSH: 1.46 u[IU]/mL (ref 0.35–4.50)

## 2021-04-25 MED ORDER — SILDENAFIL CITRATE 100 MG PO TABS: 50.0000 mg | ORAL_TABLET | Freq: Every day | ORAL | 3 refills | Status: DC | PRN

## 2021-04-25 MED ORDER — VITAMIN D3 50 MCG (2000 UT) PO CAPS: 2000.0000 [IU] | ORAL_CAPSULE | Freq: Every day | ORAL | 3 refills | Status: DC

## 2021-04-25 NOTE — Assessment & Plan Note (Addendum)
Re-start Vit D Check Vit D Is low again.  We will need to start a prescription for vitamin D.

## 2021-04-25 NOTE — Assessment & Plan Note (Signed)
Viagra prn 

## 2021-04-25 NOTE — Progress Notes (Signed)
Subjective:  Patient ID: Chad Spears, male    DOB: Jun 30, 1981  Age: 40 y.o. MRN: [id number]  CC: Knee Pain and Neck Pain   HPI  Chad Spears presents for a well exam C/o ED Working 2 jobs 7d/wk Getting a [employer]   Outpatient Medications Prior to Visit  Medication Sig Dispense Refill   sildenafil (REVATIO) 20 MG tablet TAKE 1 TO 5 TABLETS BY MOUTH AS NEEDED 60 tablet 0   Cholecalciferol (VITAMIN D3) 50 MCG (2000 UT) capsule Take 1 capsule (2,000 Units total) by mouth daily. (Patient not taking: Reported on 04/25/2021) 100 capsule 3   meclizine (ANTIVERT) 25 MG tablet Take 1 tablet (25 mg total) by mouth 3 (three) times daily as needed for dizziness. (Patient not taking: Reported on 04/25/2021) 30 tablet 0   promethazine (PHENERGAN) 25 MG tablet Take 1 tablet (25 mg total) by mouth every 8 (eight) hours as needed for nausea or vomiting. (Patient not taking: Reported on 04/25/2021) 20 tablet 0   No facility-administered medications prior to visit.    ROS: Review of Systems  Constitutional:  Positive for fatigue. Negative for appetite change and unexpected weight change.  HENT:  Negative for congestion, nosebleeds, sneezing, sore throat and trouble swallowing.   Eyes:  Negative for itching and visual disturbance.  Respiratory:  Negative for cough.   Cardiovascular:  Negative for chest pain, palpitations and leg swelling.  Gastrointestinal:  Negative for abdominal distention, blood in stool, diarrhea and nausea.  Genitourinary:  Negative for frequency and hematuria.  Musculoskeletal:  Positive for arthralgias. Negative for back pain, gait problem, joint swelling and neck pain.  Skin:  Negative for rash.  Neurological:  Negative for dizziness, tremors, speech difficulty and weakness.  Psychiatric/Behavioral:  Negative for agitation, dysphoric mood and sleep disturbance. The patient is not nervous/anxious.    Objective:  BP 102/72 (BP Location: Left Arm)   Pulse (!) 43   Temp 98.6  F (37 C) (Oral)   Ht 6' (1.829 m)   Wt 231 lb 12.8 oz (105.1 kg)   SpO2 98%   BMI 31.44 kg/m   BP Readings from Last 3 Encounters:  04/25/21 102/72  09/21/20 122/72  04/25/20 (!) 110/59    Wt Readings from Last 3 Encounters:  04/25/21 231 lb 12.8 oz (105.1 kg)  09/21/20 230 lb 12.8 oz (104.7 kg)  04/24/20 205 lb (93 kg)    Physical Exam Constitutional:      General: He is not in acute distress.    Appearance: He is well-developed. He is obese.     Comments: NAD  Eyes:     Conjunctiva/sclera: Conjunctivae normal.     Pupils: Pupils are equal, round, and reactive to light.  Neck:     Thyroid: No thyromegaly.     Vascular: No JVD.  Cardiovascular:     Rate and Rhythm: Normal rate and regular rhythm.     Heart sounds: Normal heart sounds. No murmur heard.   No friction rub. No gallop.  Pulmonary:     Effort: Pulmonary effort is normal. No respiratory distress.     Breath sounds: Normal breath sounds. No wheezing or rales.  Chest:     Chest wall: No tenderness.  Abdominal:     General: Bowel sounds are normal. There is no distension.     Palpations: Abdomen is soft. There is no mass.     Tenderness: There is no abdominal tenderness. There is no guarding or rebound.  Musculoskeletal:  General: No tenderness. Normal range of motion.     Cervical back: Normal range of motion.  Lymphadenopathy:     Cervical: No cervical adenopathy.  Skin:    General: Skin is warm and dry.     Findings: No rash.  Neurological:     Mental Status: He is alert and oriented to person, place, and time.     Cranial Nerves: No cranial nerve deficit.     Motor: No abnormal muscle tone.     Coordination: Coordination normal.     Gait: Gait normal.     Deep Tendon Reflexes: Reflexes are normal and symmetric.  Psychiatric:        Behavior: Behavior normal.        Thought Content: Thought content normal.        Judgment: Judgment normal.    Lab Results  Component Value Date   WBC  8.7 04/25/2020   HGB 14.5 04/25/2020   HCT 44.3 04/25/2020   PLT 279 04/25/2020   GLUCOSE 106 (H) 04/25/2020   CHOL 197 03/21/2020   TRIG 122.0 03/21/2020   HDL 47.80 03/21/2020   LDLCALC 125 (H) 03/21/2020   ALT 36 04/25/2020   AST 32 04/25/2020   NA 141 04/25/2020   K 3.8 04/25/2020   CL 109 04/25/2020   CREATININE 0.75 04/25/2020   BUN 13 04/25/2020   CO2 22 04/25/2020   TSH 2.20 03/21/2020   PSA 0.55 03/21/2020   INR 1.08 08/23/2010    CT Renal Stone Study  Result Date: 04/25/2020 CLINICAL DATA:  Nausea and vomiting for 2 weeks, flank pain EXAM: CT ABDOMEN AND PELVIS WITHOUT CONTRAST TECHNIQUE: Multidetector CT imaging of the abdomen and pelvis was performed following the standard protocol without IV contrast. COMPARISON:  09/05/2011 FINDINGS: Lower chest: No acute pleural or parenchymal lung disease. Hepatobiliary: No focal liver abnormality is seen. No gallstones, gallbladder wall thickening, or biliary dilatation. Pancreas: Unremarkable. No pancreatic ductal dilatation or surrounding inflammatory changes. Spleen: Normal in size without focal abnormality. Adrenals/Urinary Tract: Adrenal glands are unremarkable. Kidneys are normal, without renal calculi, focal lesion, or hydronephrosis. Bladder is unremarkable. Stomach/Bowel: No bowel obstruction or ileus. Normal appendix right lower quadrant. No bowel wall thickening or inflammatory change. Vascular/Lymphatic: Multiple borderline enlarged lymph nodes are again seen within the pelvis and inguinal regions. Index lymph nodes are as follows: Right external iliac chain, image 85/3, 14 mm short axis. Previously 9 mm. Right inguinal region, image 100/3, 14 mm.  Previously 12 mm. Left external iliac chain, image 91/3, 15 mm.  Previously 8 mm. No significant atherosclerosis. Reproductive: Prostate is unremarkable. Other: No abdominal wall hernia or abnormality. No abdominopelvic ascites. Musculoskeletal: No acute or destructive bony lesions.  Reconstructed images demonstrate no additional findings. IMPRESSION: 1. Nonspecific pelvic lymphadenopathy.  Index lymph nodes as above. 2. Otherwise no acute intra-abdominal or intrapelvic process. 3. No urinary tract calculi or obstructive uropathy. Electronically Signed   By: Randa Ngo M.D.   On: 04/25/2020 19:33    Assessment & Plan:     Follow-up: No follow-ups on file.  Walker Kehr, MD

## 2021-04-25 NOTE — Patient Instructions (Signed)
[employer] provides training, skills test + driving

## 2021-04-25 NOTE — Assessment & Plan Note (Addendum)
He has been working 2 jobs 7 d/wk  Getting a [employer] W[employer] - take a week of from 2nd job

## 2021-04-28 MED ORDER — VITAMIN D3 50 MCG (2000 UT) PO CAPS: 2000.0000 [IU] | ORAL_CAPSULE | Freq: Every day | ORAL | 3 refills | Status: DC

## 2021-04-28 MED ORDER — VITAMIN D (ERGOCALCIFEROL) 1.25 MG (50000 UNIT) PO CAPS
50000.0000 [IU] | ORAL_CAPSULE | ORAL | 0 refills | Status: DC
Start: 2021-04-28 — End: 2021-06-07

## 2021-04-28 NOTE — Assessment & Plan Note (Signed)

## 2021-04-30 ENCOUNTER — Telehealth: Payer: Self-pay | Admitting: Internal Medicine

## 2021-04-30 NOTE — Telephone Encounter (Signed)
    Please call patient to discuss lab results 

## 2021-05-01 NOTE — Telephone Encounter (Signed)
Pt has been notified.. see lab report.Marland KitchenJohny Spears

## 2021-05-25 ENCOUNTER — Other Ambulatory Visit: Payer: Self-pay | Admitting: Internal Medicine

## 2021-05-28 ENCOUNTER — Telehealth: Payer: Self-pay | Admitting: *Deleted

## 2021-05-28 NOTE — Telephone Encounter (Signed)
-----   Message from Marguarite Arbour sent at 05/28/2021  4:07 PM EDT ----- Refill request

## 2021-06-01 ENCOUNTER — Other Ambulatory Visit: Payer: Self-pay | Admitting: Internal Medicine

## 2021-06-04 ENCOUNTER — Other Ambulatory Visit: Payer: Self-pay

## 2021-06-04 ENCOUNTER — Telehealth: Payer: Self-pay

## 2021-06-04 MED ORDER — SILDENAFIL CITRATE 100 MG PO TABS: 50.0000 mg | ORAL_TABLET | Freq: Every day | ORAL | 3 refills | Status: DC | PRN

## 2021-06-04 NOTE — Telephone Encounter (Signed)
Requesting a lower dosage to be called in for 20 mg instead of 100 mg .   Please advise

## 2021-06-05 MED ORDER — SILDENAFIL CITRATE 20 MG PO TABS: ORAL_TABLET | ORAL | 3 refills | Status: DC

## 2021-06-05 NOTE — Telephone Encounter (Signed)
Okay.  Sildenafil 20 mg tablets sent.  Thanks

## 2021-06-07 ENCOUNTER — Other Ambulatory Visit: Payer: Self-pay

## 2021-06-07 ENCOUNTER — Encounter: Payer: Self-pay | Admitting: Internal Medicine

## 2021-06-07 ENCOUNTER — Ambulatory Visit (INDEPENDENT_AMBULATORY_CARE_PROVIDER_SITE_OTHER): Payer: Self-pay | Admitting: Internal Medicine

## 2021-06-07 DIAGNOSIS — Z0000 Encounter for general adult medical examination without abnormal findings: Secondary | ICD-10-CM

## 2021-06-07 NOTE — Progress Notes (Signed)
A   Subjective:  Patient ID: Chad Spears, male    DOB: 1981-06-20  Age: 40 y.o. 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 100 capsule 3   sildenafil (REVATIO) 20 MG tablet 20-40 mg po qd prn 40 tablet 3   Cholecalciferol (VITAMIN D3) 50 MCG (2000 UT) capsule Take 1 capsule (2,000 Units total) by mouth daily. 100 capsule 3   Vitamin D, Ergocalciferol, (DRISDOL) 1.25 MG (50000 UNIT) CAPS capsule Take 1 capsule (50,000 Units total) by mouth every 7 (seven) days. (Patient not taking: Reported on 06/07/2021) 8 capsule 0   No facility-administered medications prior to visit.    ROS: Review of Systems  Constitutional:  Positive for unexpected weight change. Negative for appetite change and fatigue.  HENT:  Negative for congestion, nosebleeds, sneezing, sore throat and trouble swallowing.   Eyes:  Negative for itching and visual disturbance.  Respiratory:  Negative for cough.   Cardiovascular:  Negative for chest pain, palpitations and leg swelling.  Gastrointestinal:  Negative for abdominal distention, blood in stool, diarrhea and nausea.  Genitourinary:  Negative for frequency and hematuria.  Musculoskeletal:  Negative for back pain, gait problem, joint swelling and neck pain.  Skin:  Negative for rash.  Neurological:  Negative for dizziness, tremors, speech difficulty and weakness.  Psychiatric/Behavioral:  Negative for agitation, dysphoric mood and sleep disturbance. The patient is not nervous/anxious.    Objective:  BP 110/78 (BP Location: Left Arm)   Pulse 69   Temp 98.2 F (36.8 C) (Oral)   Ht 6' (1.829 m)   Wt 239 lb 3.2 oz (108.5 kg)   SpO2 98%   BMI 32.44 kg/m   BP Readings from Last 3 Encounters:  06/07/21 110/78  04/25/21 102/72  09/21/20 122/72    Wt  Readings from Last 3 Encounters:  06/07/21 239 lb 3.2 oz (108.5 kg)  04/25/21 231 lb 12.8 oz (105.1 kg)  09/21/20 230 lb 12.8 oz (104.7 kg)    Physical Exam Constitutional:      General: He is not in acute distress.    Appearance: He is well-developed. He is obese.     Comments: NAD  HENT:     Head: Normocephalic.     Right Ear: Tympanic membrane, ear canal and external ear normal. There is no impacted cerumen.     Left Ear: Tympanic membrane, ear canal and external ear normal. There is no impacted cerumen.  Eyes:     General: No scleral icterus.       Right eye: No discharge.        Left eye: No discharge.     Conjunctiva/sclera: Conjunctivae normal.     Pupils: Pupils are equal, round, and reactive to light.  Neck:     Thyroid: No thyromegaly.     Vascular: No JVD.  Cardiovascular:     Rate and Rhythm: Normal rate and regular rhythm.     Heart sounds: Normal heart sounds. No murmur heard.   No friction rub. No gallop.  Pulmonary:     Effort: Pulmonary effort is normal. No respiratory distress.     Breath sounds: Normal breath sounds. No wheezing or rales.  Chest:     Chest wall: No tenderness.  Abdominal:     General:  Bowel sounds are normal. There is no distension.     Palpations: Abdomen is soft. There is no mass.     Tenderness: There is no abdominal tenderness. There is no guarding or rebound.  Musculoskeletal:        General: No tenderness. Normal range of motion.     Cervical back: Normal range of motion.  Lymphadenopathy:     Cervical: No cervical adenopathy.  Skin:    General: Skin is warm and dry.     Findings: No rash.  Neurological:     Mental Status: He is alert and oriented to person, place, and time.     Cranial Nerves: No cranial nerve deficit.     Motor: No abnormal muscle tone.     Coordination: Coordination normal.     Gait: Gait normal.     Deep Tendon Reflexes: Reflexes are normal and symmetric.  Psychiatric:        Behavior: Behavior normal.         Thought Content: Thought content normal.        Judgment: Judgment normal.  Normal vision and hearing  Lab Results  Component Value Date   WBC 8.2 04/25/2021   HGB 14.4 04/25/2021   HCT 43.6 04/25/2021   PLT 267.0 04/25/2021   GLUCOSE 89 04/25/2021   CHOL 177 04/25/2021   TRIG 41.0 04/25/2021   HDL 51.20 04/25/2021   LDLCALC 117 (H) 04/25/2021   ALT 36 04/25/2021   AST 32 04/25/2021   NA 137 04/25/2021   K 3.9 04/25/2021   CL 103 04/25/2021   CREATININE 0.77 04/25/2021   BUN 14 04/25/2021   CO2 26 04/25/2021   TSH 1.46 04/25/2021   PSA 0.55 03/21/2020   INR 1.08 08/23/2010    CT Renal Stone Study  Result Date: 04/25/2020 CLINICAL DATA:  Nausea and vomiting for 2 weeks, flank pain EXAM: CT ABDOMEN AND PELVIS WITHOUT CONTRAST TECHNIQUE: Multidetector CT imaging of the abdomen and pelvis was performed following the standard protocol without IV contrast. COMPARISON:  09/05/2011 FINDINGS: Lower chest: No acute pleural or parenchymal lung disease. Hepatobiliary: No focal liver abnormality is seen. No gallstones, gallbladder wall thickening, or biliary dilatation. Pancreas: Unremarkable. No pancreatic ductal dilatation or surrounding inflammatory changes. Spleen: Normal in size without focal abnormality. Adrenals/Urinary Tract: Adrenal glands are unremarkable. Kidneys are normal, without renal calculi, focal lesion, or hydronephrosis. Bladder is unremarkable. Stomach/Bowel: No bowel obstruction or ileus. Normal appendix right lower quadrant. No bowel wall thickening or inflammatory change. Vascular/Lymphatic: Multiple borderline enlarged lymph nodes are again seen within the pelvis and inguinal regions. Index lymph nodes are as follows: Right external iliac chain, image 85/3, 14 mm short axis. Previously 9 mm. Right inguinal region, image 100/3, 14 mm.  Previously 12 mm. Left external iliac chain, image 91/3, 15 mm.  Previously 8 mm. No significant atherosclerosis. Reproductive:  Prostate is unremarkable. Other: No abdominal wall hernia or abnormality. No abdominopelvic ascites. Musculoskeletal: No acute or destructive bony lesions. Reconstructed images demonstrate no additional findings. IMPRESSION: 1. Nonspecific pelvic lymphadenopathy.  Index lymph nodes as above. 2. Otherwise no acute intra-abdominal or intrapelvic process. 3. No urinary tract calculi or obstructive uropathy. Electronically Signed   By: Randa Ngo M.D.   On: 04/25/2020 19:33    Assessment & Plan:   There are no diagnoses linked to this encounter.   No orders of the defined types were placed in this encounter.    Follow-up: No follow-ups on file.  Walker Kehr, MD

## 2021-06-07 NOTE — Assessment & Plan Note (Addendum)
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 We discussed age appropriate health related issues, including available/recomended screening tests and vaccinations. Labs were ordered to be later reviewed . All questions were answered. We discussed one or more of the following - seat belt use, use of sunscreen/sun exposure exercise, safe sex, fall risk reduction, second hand smoke exposure, firearm use and storage, seat belt use, a need for adhering to healthy diet and exercise. Labs were ordered.  All questions were answered. CDM form was downloaded from the Internet, printed out and filled out by myself.  Repeat CDL exam in 2 years.

## 2021-06-10 ENCOUNTER — Encounter: Payer: Self-pay | Admitting: Internal Medicine

## 2021-07-23 ENCOUNTER — Other Ambulatory Visit: Payer: Self-pay | Admitting: Internal Medicine

## 2021-07-25 ENCOUNTER — Other Ambulatory Visit: Payer: Self-pay | Admitting: Internal Medicine

## 2021-08-10 ENCOUNTER — Other Ambulatory Visit: Payer: Self-pay | Admitting: Internal Medicine

## 2021-08-20 ENCOUNTER — Telehealth: Payer: Self-pay | Admitting: Internal Medicine

## 2021-08-20 NOTE — Telephone Encounter (Signed)
Patient is requesting provider submit new rx for Cholecalciferol (VITAMIN D3) 50 MCG (2000 UT) capsule  Advised patient provider denied prev request bc he wants him to take something daily OTC  Patient says she needs this one instead of the OTC  Please follow up

## 2021-08-21 NOTE — Telephone Encounter (Signed)
Pt is wanting rx for the Vitamin D 50,000. Instead of otc...Chad Spears

## 2021-08-22 MED ORDER — VITAMIN D (ERGOCALCIFEROL) 1.25 MG (50000 UNIT) PO CAPS: 50000.0000 [IU] | ORAL_CAPSULE | ORAL | 3 refills | Status: DC

## 2021-08-22 NOTE — Telephone Encounter (Signed)
Notified pt MD sent rx to [facility]../lmb 

## 2021-08-22 NOTE — Telephone Encounter (Signed)
Okay.  Thanks.

## 2021-09-04 ENCOUNTER — Ambulatory Visit (INDEPENDENT_AMBULATORY_CARE_PROVIDER_SITE_OTHER): Payer: BC Managed Care – PPO | Admitting: Internal Medicine

## 2021-09-04 ENCOUNTER — Other Ambulatory Visit: Payer: Self-pay

## 2021-09-04 ENCOUNTER — Encounter: Payer: Self-pay | Admitting: Internal Medicine

## 2021-09-04 VITALS — BP 112/80 | HR 82 | Temp 98.5°F | Ht 72.0 in | Wt 241.4 lb

## 2021-09-04 DIAGNOSIS — Z23 Encounter for immunization: Secondary | ICD-10-CM

## 2021-09-04 DIAGNOSIS — M25569 Pain in unspecified knee: Secondary | ICD-10-CM | POA: Insufficient documentation

## 2021-09-04 DIAGNOSIS — R739 Hyperglycemia, unspecified: Secondary | ICD-10-CM | POA: Diagnosis not present

## 2021-09-04 DIAGNOSIS — R635 Abnormal weight gain: Secondary | ICD-10-CM

## 2021-09-04 DIAGNOSIS — E669 Obesity, unspecified: Secondary | ICD-10-CM | POA: Diagnosis not present

## 2021-09-04 DIAGNOSIS — M25561 Pain in right knee: Secondary | ICD-10-CM

## 2021-09-04 MED ORDER — MELOXICAM 15 MG PO TABS
15.0000 mg | ORAL_TABLET | Freq: Every day | ORAL | 1 refills | Status: DC
Start: 2021-09-04 — End: 2021-11-10

## 2021-09-04 MED ORDER — PHENTERMINE HCL 37.5 MG PO TABS: 37.5000 mg | ORAL_TABLET | Freq: Every day | ORAL | 2 refills | Status: DC

## 2021-09-04 NOTE — Progress Notes (Signed)
Subjective:  Patient ID: Chad Spears, male    DOB: 27-Sep-1981  Age: 40 y.o. MRN: [id number]  CC: Foot Swelling   HPI Chad Spears presents for ED, vit D def. 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 C/o wt gain   Outpatient Medications Prior to Visit  Medication Sig Dispense Refill   sildenafil (REVATIO) 20 MG tablet 20-40 mg po qd prn 40 tablet 3   Vitamin D, Ergocalciferol, (DRISDOL) 1.25 MG (50000 UNIT) CAPS capsule Take 1 capsule (50,000 Units total) by mouth every 30 (thirty) days. 3 capsule 3   No facility-administered medications prior to visit.    ROS: Review of Systems  Constitutional:  Positive for unexpected weight change. Negative for appetite change and fatigue.  HENT:  Negative for congestion, nosebleeds, sneezing, sore throat and trouble swallowing.   Eyes:  Negative for itching and visual disturbance.  Respiratory:  Negative for cough.   Cardiovascular:  Negative for chest pain, palpitations and leg swelling.  Gastrointestinal:  Negative for abdominal distention, blood in stool, diarrhea and nausea.  Genitourinary:  Negative for frequency and hematuria.  Musculoskeletal:  Negative for back pain, gait problem, joint swelling and neck pain.  Skin:  Negative for rash.  Neurological:  Negative for dizziness, tremors, speech difficulty and weakness.  Psychiatric/Behavioral:  Negative for agitation, dysphoric mood, sleep disturbance and suicidal ideas. The patient is not nervous/anxious.    Objective:  BP 112/80 (BP Location: Left Arm)   Pulse 82   Temp 98.5 F (36.9 C) (Oral)   Ht 6' (1.829 m)   Wt 241 lb 6.4 oz (109.5 kg)   SpO2 98%   BMI 32.74 kg/m   BP Readings from Last 3 Encounters:  09/04/21 112/80  06/07/21 110/78  04/25/21 102/72    Wt Readings from Last 3 Encounters:  09/04/21 241 lb 6.4 oz (109.5 kg)  06/07/21 239 lb 3.2 oz (108.5 kg)  04/25/21 231 lb 12.8 oz (105.1 kg)    Physical Exam Constitutional:       General: He is not in acute distress.    Appearance: He is well-developed. He is obese.     Comments: NAD  Eyes:     Conjunctiva/sclera: Conjunctivae normal.     Pupils: Pupils are equal, round, and reactive to light.  Neck:     Thyroid: No thyromegaly.     Vascular: No JVD.  Cardiovascular:     Rate and Rhythm: Normal rate and regular rhythm.     Heart sounds: Normal heart sounds. No murmur heard.   No friction rub. No gallop.  Pulmonary:     Effort: Pulmonary effort is normal. No respiratory distress.     Breath sounds: Normal breath sounds. No wheezing or rales.  Chest:     Chest wall: No tenderness.  Abdominal:     General: Bowel sounds are normal. There is no distension.     Palpations: Abdomen is soft. There is no mass.     Tenderness: There is no abdominal tenderness. There is no guarding or rebound.  Musculoskeletal:        General: No tenderness. Normal range of motion.     Cervical back: Normal range of motion.  Lymphadenopathy:     Cervical: No cervical adenopathy.  Skin:    General: Skin is warm and dry.     Findings: No rash.  Neurological:     Mental Status: He is alert and oriented to person, place, and  time.     Cranial Nerves: No cranial nerve deficit.     Motor: No abnormal muscle tone.     Coordination: Coordination normal.     Gait: Gait normal.     Deep Tendon Reflexes: Reflexes are normal and symmetric.  Psychiatric:        Behavior: Behavior normal.        Thought Content: Thought content normal.        Judgment: Judgment normal.  R knee tibia bursa  Lab Results  Component Value Date   WBC 8.2 04/25/2021   HGB 14.4 04/25/2021   HCT 43.6 04/25/2021   PLT 267.0 04/25/2021   GLUCOSE 89 04/25/2021   CHOL 177 04/25/2021   TRIG 41.0 04/25/2021   HDL 51.20 04/25/2021   LDLCALC 117 (H) 04/25/2021   ALT 36 04/25/2021   AST 32 04/25/2021   NA 137 04/25/2021   K 3.9 04/25/2021   CL 103 04/25/2021   CREATININE 0.77 04/25/2021   BUN 14 04/25/2021    CO2 26 04/25/2021   TSH 1.46 04/25/2021   PSA 0.55 03/21/2020   INR 1.08 08/23/2010    CT Renal Stone Study  Result Date: 04/25/2020 CLINICAL DATA:  Nausea and vomiting for 2 weeks, flank pain EXAM: CT ABDOMEN AND PELVIS WITHOUT CONTRAST TECHNIQUE: Multidetector CT imaging of the abdomen and pelvis was performed following the standard protocol without IV contrast. COMPARISON:  09/05/2011 FINDINGS: Lower chest: No acute pleural or parenchymal lung disease. Hepatobiliary: No focal liver abnormality is seen. No gallstones, gallbladder wall thickening, or biliary dilatation. Pancreas: Unremarkable. No pancreatic ductal dilatation or surrounding inflammatory changes. Spleen: Normal in size without focal abnormality. Adrenals/Urinary Tract: Adrenal glands are unremarkable. Kidneys are normal, without renal calculi, focal lesion, or hydronephrosis. Bladder is unremarkable. Stomach/Bowel: No bowel obstruction or ileus. Normal appendix right lower quadrant. No bowel wall thickening or inflammatory change. Vascular/Lymphatic: Multiple borderline enlarged lymph nodes are again seen within the pelvis and inguinal regions. Index lymph nodes are as follows: Right external iliac chain, image 85/3, 14 mm short axis. Previously 9 mm. Right inguinal region, image 100/3, 14 mm.  Previously 12 mm. Left external iliac chain, image 91/3, 15 mm.  Previously 8 mm. No significant atherosclerosis. Reproductive: Prostate is unremarkable. Other: No abdominal wall hernia or abnormality. No abdominopelvic ascites. Musculoskeletal: No acute or destructive bony lesions. Reconstructed images demonstrate no additional findings. IMPRESSION: 1. Nonspecific pelvic lymphadenopathy.  Index lymph nodes as above. 2. Otherwise no acute intra-abdominal or intrapelvic process. 3. No urinary tract calculi or obstructive uropathy. Electronically Signed   By: Randa Ngo M.D.   On: 04/25/2020 19:33    Assessment & Plan:   Problem List Items  Addressed This Visit     Knee pain    OA - Meloxicam Cut back on treadmill use for now      Obesity (BMI 30.0-34.9)    Worse Start Phentermine  Potential benefits of a long term adipex use as well as potential risks  and complications were explained to the patient and were aknowledged.       Weight gain    Worse Start Phentermine  Potential benefits of a long term adipex use as well as potential risks  and complications were explained to the patient and were aknowledged.         Meds ordered this encounter  Medications   meloxicam (MOBIC) 15 MG tablet    Sig: Take 1 tablet (15 mg total) by mouth daily.  Dispense:  30 tablet    Refill:  1   phentermine (ADIPEX-P) 37.5 MG tablet    Sig: Take 1 tablet (37.5 mg total) by mouth daily before breakfast.    Dispense:  30 tablet    Refill:  2  Follow-up: Return in about 3 months (around 12/05/2021) for a follow-up visit.  Walker Kehr, MD

## 2021-09-04 NOTE — Assessment & Plan Note (Signed)
Worse Start Phentermine  Potential benefits of a long term adipex use as well as potential risks  and complications were explained to the patient and were aknowledged.

## 2021-09-04 NOTE — Assessment & Plan Note (Signed)
OA - Meloxicam Cut back on treadmill use for now

## 2021-09-04 NOTE — Patient Instructions (Signed)
These suggestions will probably help you to improve your metabolism if you are not overweight and to lose weight if you are overweight: 1.  Reduce your consumption of sugars and starches.  Eliminate high fructose corn syrup from your diet.  Reduce your consumption of processed foods.  For desserts try to have seasonal fruits, berries, nuts, cheeses or dark chocolate with more than 70% cacao. 2.  Do not snack 3.  You do not have to eat breakfast.  If you choose to have breakfast - eat plain greek yogurt, eggs, oatmeal (without sugar) - use honey if you need to. 4.  Drink water, freshly brewed unsweetened tea (green, black or herbal) or coffee.  Do not drink sodas including diet sodas , juices, beverages sweetened with artificial sweeteners. 5.  Reduce your consumption of refined grains. 6.  Avoid protein drinks such as Optifast, Slim fast etc. Eat chicken, fish, meat, dairy and beans for your sources of protein. 7.  Natural unprocessed fats like cold pressed virgin olive oil, butter, coconut oil are good for you.  Eat avocados. 8.  Increase your consumption of fiber.  Fruits, berries, vegetables, whole grains, flaxseed, chia seeds, beans, popcorn, nuts, oatmeal are good sources of fiber 9.  Use vinegar in your diet, i.e. apple cider vinegar, red wine or balsamic vinegar 10.  You can try fasting.  For example you can skip breakfast and lunch every other day (24-hour fast) 11.  Stress reduction, good night sleep, relaxation, meditation, yoga and other physical activity is likely to help you to maintain low weight too. 12.  If you drink alcohol, limit your alcohol intake to no more than 2 drinks a day.    Bursitis Bursitis is when the fluid-filled sac (bursa) that covers and protects a joint is swollen (inflamed). Bursitis is most common near joints such as the knees, elbows, hips, and shoulders. It can cause pain and stiffness. Follow these instructions at home: Medicines Take over-the-counter  and prescription medicines only as told by your doctor. If you were prescribed an antibiotic medicine, take it as told by your doctor. Do not stop taking it even if you start to feel better. General instructions  Rest the affected area as told by your doctor. If you can, raise (elevate) the affected area above the level of your heart while you are sitting or lying down. Avoid doing things that make the pain worse. Use a splint, brace, pad, or walking aid as told by your doctor. If directed, put ice on the affected area: If you have a removable splint or brace, take it off as told by your doctor. Put ice in a plastic bag. Place a towel between your skin and the bag, or between the splint or brace and the bag. Leave the ice on for 20 minutes, 2-3 times a day. Keep all follow-up visits as told by your doctor. This is important. Preventing symptoms Do these things to help you not have symptoms again: Wear knee pads if you kneel often. Wear sturdy running or walking shoes that fit you well. Take a lot of breaks during activities that involve doing the same movements again and again. Before you do any activity that takes a lot of effort, get your body ready by stretching. Stay at a healthy weight or lose weight if your doctor says you should. If you need help doing this, ask your doctor. Exercise often. If you start any new physical activity, do it slowly. Contact a doctor if you:  Have a fever. Have chills. Have symptoms that do not get better with treatment or home care. Summary Bursitis is when the fluid-filled sac (bursa) that covers and protects a joint is swollen. Rest the affected area as told by your doctor. Avoid doing things that make the pain worse. Put ice on the affected area as told by your doctor. This information is not intended to replace advice given to you by your health care provider. Make sure you discuss any questions you have with your health care provider. Document  Revised: 05/03/2020 Document Reviewed: 05/03/2020 Elsevier Patient Education  [employer].

## 2021-09-10 ENCOUNTER — Other Ambulatory Visit: Payer: Self-pay | Admitting: Internal Medicine

## 2021-10-23 ENCOUNTER — Telehealth: Payer: Self-pay | Admitting: Internal Medicine

## 2021-10-23 NOTE — Telephone Encounter (Signed)
Patient calling in  Patient says he does not think the phentermine (ADIPEX-P) 37.5 MG tablet medication is working  Wants to know if provider can increase dosage or send in another medication that will work better  [place], [place]  Phone:  [phone] Fax:  [phone]

## 2021-10-24 NOTE — Telephone Encounter (Signed)
Tried calling pt there was no answer & can't leave msg due to mail box being full. Well try later...Chad Spears

## 2021-10-24 NOTE — Telephone Encounter (Signed)
This is the max dose. There is nothing else inexpensive to offer. Focus on low carb diet, fasting as we discussed before. Thx

## 2021-10-25 NOTE — Telephone Encounter (Signed)
Pt return call back gave him MD response.Marland KitchenJohny Spears

## 2021-10-25 NOTE — Telephone Encounter (Signed)
Tried to call pt again still no answer and can't leave msg due to vm being full.Marland Kitchen/LMB

## 2021-11-10 ENCOUNTER — Other Ambulatory Visit: Payer: Self-pay | Admitting: Internal Medicine

## 2021-11-13 ENCOUNTER — Other Ambulatory Visit: Payer: Self-pay | Admitting: Internal Medicine

## 2021-12-03 ENCOUNTER — Other Ambulatory Visit: Payer: Self-pay | Admitting: Internal Medicine

## 2021-12-06 ENCOUNTER — Ambulatory Visit: Payer: BC Managed Care – PPO | Admitting: Internal Medicine

## 2021-12-06 ENCOUNTER — Telehealth: Payer: Self-pay | Admitting: Internal Medicine

## 2021-12-06 NOTE — Telephone Encounter (Signed)
1.Medication Requested: phentermine (ADIPEX-P) 37.5 MG tablet 2. Pharmacy (Name, Street, [place]): [facility], [place] Phone:  [phone]  Fax:  [phone]     3. On Med List: Y  4. Last Visit with PCP:  5. Next visit date with PCP:   Agent: Please be advised that RX refills may take up to 3 business days. We ask that you follow-up with your pharmacy.

## 2021-12-07 NOTE — Telephone Encounter (Signed)
Pt returning call, informed pt of provider's response  *see below*  Pt expressed understanding

## 2021-12-07 NOTE — Telephone Encounter (Signed)
Notified pt w/ MD response../l b 

## 2021-12-07 NOTE — Telephone Encounter (Signed)
Noted../lmb 

## 2021-12-07 NOTE — Telephone Encounter (Signed)
It was done on 12/05/2021.  Thanks

## 2021-12-25 ENCOUNTER — Other Ambulatory Visit: Payer: Self-pay | Admitting: Internal Medicine

## 2022-01-28 ENCOUNTER — Other Ambulatory Visit: Payer: Self-pay | Admitting: Internal Medicine

## 2022-02-11 ENCOUNTER — Telehealth: Payer: Self-pay | Admitting: Internal Medicine

## 2022-02-11 NOTE — Telephone Encounter (Signed)
1.Medication Requested: phentermine (ADIPEX-P) 37.5 MG tablet ? ?2. Pharmacy (Name, Street, [place]): [facility], [place] ? ?3. On Med List: Y ? ?4. Last Visit with PCP: 09-04-2021 ? ?5. Next visit date with PCP: 03-14-2022 ? ?Pt states he has been taking 2 tablets per day ? ?Pt inquiring if a 2 mo supply can be sent it ?

## 2022-02-14 MED ORDER — PHENTERMINE HCL 37.5 MG PO TABS: 37.5000 mg | ORAL_TABLET | Freq: Every day | ORAL | 0 refills | Status: DC

## 2022-02-14 NOTE — Telephone Encounter (Signed)
OK ?Schedule follow-up visit.  Thanks ?

## 2022-02-22 ENCOUNTER — Other Ambulatory Visit: Payer: Self-pay | Admitting: Internal Medicine

## 2022-02-27 ENCOUNTER — Other Ambulatory Visit: Payer: Self-pay | Admitting: Internal Medicine

## 2022-02-27 NOTE — Telephone Encounter (Signed)
Pls advise if ok to renew../lm,b ?

## 2022-03-05 ENCOUNTER — Other Ambulatory Visit: Payer: Self-pay | Admitting: Internal Medicine

## 2022-03-14 ENCOUNTER — Ambulatory Visit: Payer: BC Managed Care – PPO | Admitting: Internal Medicine

## 2022-03-16 ENCOUNTER — Other Ambulatory Visit: Payer: Self-pay | Admitting: Internal Medicine

## 2022-03-19 ENCOUNTER — Other Ambulatory Visit: Payer: Self-pay | Admitting: Internal Medicine

## 2022-03-21 ENCOUNTER — Telehealth: Payer: Self-pay | Admitting: Internal Medicine

## 2022-03-21 NOTE — Telephone Encounter (Signed)
Not not needed 

## 2022-03-29 ENCOUNTER — Encounter: Payer: Self-pay | Admitting: Internal Medicine

## 2022-03-29 ENCOUNTER — Telehealth (INDEPENDENT_AMBULATORY_CARE_PROVIDER_SITE_OTHER): Payer: BC Managed Care – PPO | Admitting: Internal Medicine

## 2022-03-29 DIAGNOSIS — R635 Abnormal weight gain: Secondary | ICD-10-CM

## 2022-03-29 DIAGNOSIS — M545 Low back pain, unspecified: Secondary | ICD-10-CM

## 2022-03-29 DIAGNOSIS — G8929 Other chronic pain: Secondary | ICD-10-CM

## 2022-03-29 DIAGNOSIS — E559 Vitamin D deficiency, unspecified: Secondary | ICD-10-CM | POA: Diagnosis not present

## 2022-03-29 DIAGNOSIS — N528 Other male erectile dysfunction: Secondary | ICD-10-CM

## 2022-03-29 MED ORDER — SILDENAFIL CITRATE 20 MG PO TABS: ORAL_TABLET | ORAL | 5 refills | Status: DC

## 2022-03-29 MED ORDER — VITAMIN D (ERGOCALCIFEROL) 1.25 MG (50000 UNIT) PO CAPS: ORAL_CAPSULE | ORAL | 4 refills | Status: DC

## 2022-03-29 MED ORDER — PHENTERMINE HCL 37.5 MG PO TABS: 37.5000 mg | ORAL_TABLET | Freq: Every day | ORAL | 2 refills | Status: DC

## 2022-03-29 MED ORDER — MELOXICAM 15 MG PO TABS: 15.0000 mg | ORAL_TABLET | Freq: Every day | ORAL | 2 refills | Status: DC

## 2022-03-29 NOTE — Progress Notes (Signed)
Virtual Visit via Video Note  I connected with Chad Spears on 03/29/22 at 11:00 AM EDT by a video enabled telemedicine application and verified that I am speaking with the correct person using two identifiers.   I discussed the limitations of evaluation and management by telemedicine and the availability of in person appointments. The patient expressed understanding and agreed to proceed.  I was located at our [facility] office. The patient was at home. There was no one else present in the visit.  No chief complaint on file.    History of Present Illness:  F/u on obesity, OA, ED, Vit D def Review of Systems  HENT:  Negative for congestion.   Eyes:  Negative for pain.  Cardiovascular:  Negative for palpitations and leg swelling.  Gastrointestinal:  Negative for heartburn.  Musculoskeletal:  Positive for joint pain. Negative for neck pain.  Psychiatric/Behavioral:  Negative for depression. The patient is not nervous/anxious.     Observations/Objective: The patient appears to be in no acute distress, looks well  Assessment and Plan:  Problem List Items Addressed This Visit     BACK PAIN, LUMBAR    Use Meloxicam 15 mg prn pc On Vit D Wt loss       Weight gain    Continue Phentermine  Potential benefits of a long term adipex use as well as potential risks  and complications were explained to the patient and were aknowledged. RTC 3 mo      Erectile dysfunction    Cont on Revatio prn      Vitamin D deficiency    Cont w/Vit D 50000 iu Rx         No orders of the defined types were placed in this encounter.    Follow Up Instructions:    I discussed the assessment and treatment plan with the patient. The patient was provided an opportunity to ask questions and all were answered. The patient agreed with the plan and demonstrated an understanding of the instructions.   The patient was advised to call back or seek an in-person evaluation if the symptoms worsen or  if the condition fails to improve as anticipated.  I provided face-to-face time during this encounter. We were at different locations.   Walker Kehr, MD

## 2022-03-29 NOTE — Assessment & Plan Note (Signed)
Continue Phentermine  Potential benefits of a long term adipex use as well as potential risks  and complications were explained to the patient and were aknowledged. RTC 3 mo

## 2022-03-29 NOTE — Assessment & Plan Note (Signed)
Cont w/Vit D 50000 iu Rx

## 2022-03-29 NOTE — Assessment & Plan Note (Signed)
Cont on Revatio prn

## 2022-03-29 NOTE — Assessment & Plan Note (Signed)
Use Meloxicam 15 mg prn pc On Vit D Wt loss

## 2022-04-09 ENCOUNTER — Ambulatory Visit: Payer: BC Managed Care – PPO | Admitting: Internal Medicine

## 2022-05-09 ENCOUNTER — Ambulatory Visit: Payer: BC Managed Care – PPO | Admitting: Internal Medicine

## 2022-07-06 ENCOUNTER — Other Ambulatory Visit: Payer: Self-pay | Admitting: Internal Medicine

## 2022-07-10 ENCOUNTER — Other Ambulatory Visit: Payer: Self-pay | Admitting: Internal Medicine

## 2022-07-22 ENCOUNTER — Other Ambulatory Visit: Payer: Self-pay | Admitting: Internal Medicine

## 2022-08-06 ENCOUNTER — Other Ambulatory Visit: Payer: Self-pay | Admitting: Internal Medicine

## 2022-09-04 ENCOUNTER — Ambulatory Visit: Payer: BC Managed Care – PPO | Admitting: Internal Medicine

## 2022-09-19 ENCOUNTER — Ambulatory Visit (INDEPENDENT_AMBULATORY_CARE_PROVIDER_SITE_OTHER): Payer: BC Managed Care – PPO | Admitting: Internal Medicine

## 2022-09-19 ENCOUNTER — Encounter: Payer: Self-pay | Admitting: Internal Medicine

## 2022-09-19 VITALS — BP 112/80 | HR 88 | Temp 98.1°F | Ht 72.0 in | Wt 234.6 lb

## 2022-09-19 DIAGNOSIS — Z23 Encounter for immunization: Secondary | ICD-10-CM

## 2022-09-19 DIAGNOSIS — Z0000 Encounter for general adult medical examination without abnormal findings: Secondary | ICD-10-CM | POA: Diagnosis not present

## 2022-09-19 MED ORDER — PHENTERMINE HCL 37.5 MG PO TABS: 37.5000 mg | ORAL_TABLET | Freq: Every day | ORAL | 0 refills | Status: DC

## 2022-09-19 MED ORDER — MELOXICAM 15 MG PO TABS: 15.0000 mg | ORAL_TABLET | Freq: Every day | ORAL | 1 refills | Status: DC | PRN

## 2022-09-19 MED ORDER — VITAMIN D (ERGOCALCIFEROL) 1.25 MG (50000 UNIT) PO CAPS: ORAL_CAPSULE | ORAL | 3 refills | Status: DC

## 2022-09-19 MED ORDER — SILDENAFIL CITRATE 20 MG PO TABS: ORAL_TABLET | ORAL | 3 refills | Status: DC

## 2022-09-19 NOTE — Progress Notes (Signed)
Subjective:  Patient ID: Chad Spears, male    DOB: April 28, 1981  Age: 41 y.o. MRN: [id number]  CC: Follow-up (6 month f/u- Flu shot)   HPI Chad Spears presents for obesity, ED, LBP On diet  Outpatient Medications Prior to Visit  Medication Sig Dispense Refill   meloxicam (MOBIC) 15 MG tablet Take 1 tablet by mouth once daily 30 tablet 1   phentermine (ADIPEX-P) 37.5 MG tablet TAKE 1 TABLET BY MOUTH ONCE DAILY BEFORE BREAKFAST 30 tablet 2   sildenafil (REVATIO) 20 MG tablet TAKE 1 TO 4 TABLETS BY MOUTH ONCE DAILY AS NEEDED 40 tablet 2   Vitamin D, Ergocalciferol, (DRISDOL) 1.25 MG (50000 UNIT) CAPS capsule TAKE 1 CAPSULE BY MOUTH ONCE EVERY MONTH 3 capsule 3   No facility-administered medications prior to visit.    ROS: Review of Systems  Constitutional:  Negative for appetite change, fatigue and unexpected weight change.  HENT:  Negative for congestion, nosebleeds, sneezing, sore throat and trouble swallowing.   Eyes:  Negative for itching and visual disturbance.  Respiratory:  Negative for cough.   Cardiovascular:  Negative for chest pain, palpitations and leg swelling.  Gastrointestinal:  Negative for abdominal distention, blood in stool, diarrhea and nausea.  Genitourinary:  Negative for frequency and hematuria.  Musculoskeletal:  Positive for back pain. Negative for gait problem, joint swelling and neck pain.  Skin:  Negative for rash.  Neurological:  Negative for dizziness, tremors, speech difficulty and weakness.  Psychiatric/Behavioral:  Negative for agitation, dysphoric mood and sleep disturbance. The patient is not nervous/anxious.     Objective:  BP 112/80 (BP Location: Left Arm)   Pulse 88   Temp 98.1 F (36.7 C) (Oral)   Ht 6' (1.829 m)   Wt 234 lb 9.6 oz (106.4 kg)   SpO2 96%   BMI 31.82 kg/m   BP Readings from Last 3 Encounters:  09/19/22 112/80  09/04/21 112/80  06/07/21 110/78    Wt Readings from Last 3 Encounters:  09/19/22 234 lb 9.6 oz (106.4  kg)  09/04/21 241 lb 6.4 oz (109.5 kg)  06/07/21 239 lb 3.2 oz (108.5 kg)    Physical Exam Constitutional:      General: He is not in acute distress.    Appearance: He is well-developed. He is obese.     Comments: NAD  Eyes:     Conjunctiva/sclera: Conjunctivae normal.     Pupils: Pupils are equal, round, and reactive to light.  Neck:     Thyroid: No thyromegaly.     Vascular: No JVD.  Cardiovascular:     Rate and Rhythm: Normal rate and regular rhythm.     Heart sounds: Normal heart sounds. No murmur heard.    No friction rub. No gallop.  Pulmonary:     Effort: Pulmonary effort is normal. No respiratory distress.     Breath sounds: Normal breath sounds. No wheezing or rales.  Chest:     Chest wall: No tenderness.  Abdominal:     General: Bowel sounds are normal. There is no distension.     Palpations: Abdomen is soft. There is no mass.     Tenderness: There is no abdominal tenderness. There is no guarding or rebound.  Musculoskeletal:        General: No tenderness. Normal range of motion.     Cervical back: Normal range of motion.  Lymphadenopathy:     Cervical: No cervical adenopathy.  Skin:    General: Skin is warm  and dry.     Findings: No rash.  Neurological:     Mental Status: He is alert and oriented to person, place, and time.     Cranial Nerves: No cranial nerve deficit.     Motor: No abnormal muscle tone.     Coordination: Coordination normal.     Gait: Gait normal.     Deep Tendon Reflexes: Reflexes are normal and symmetric.  Psychiatric:        Behavior: Behavior normal.        Thought Content: Thought content normal.        Judgment: Judgment normal.     Lab Results  Component Value Date   WBC 8.2 04/25/2021   HGB 14.4 04/25/2021   HCT 43.6 04/25/2021   PLT 267.0 04/25/2021   GLUCOSE 89 04/25/2021   CHOL 177 04/25/2021   TRIG 41.0 04/25/2021   HDL 51.20 04/25/2021   LDLCALC 117 (H) 04/25/2021   ALT 36 04/25/2021   AST 32 04/25/2021   NA 137  04/25/2021   K 3.9 04/25/2021   CL 103 04/25/2021   CREATININE 0.77 04/25/2021   BUN 14 04/25/2021   CO2 26 04/25/2021   TSH 1.46 04/25/2021   PSA 0.55 03/21/2020   INR 1.08 08/23/2010    CT Renal Stone Study  Result Date: 04/25/2020 CLINICAL DATA:  Nausea and vomiting for 2 weeks, flank pain EXAM: CT ABDOMEN AND PELVIS WITHOUT CONTRAST TECHNIQUE: Multidetector CT imaging of the abdomen and pelvis was performed following the standard protocol without IV contrast. COMPARISON:  09/05/2011 FINDINGS: Lower chest: No acute pleural or parenchymal lung disease. Hepatobiliary: No focal liver abnormality is seen. No gallstones, gallbladder wall thickening, or biliary dilatation. Pancreas: Unremarkable. No pancreatic ductal dilatation or surrounding inflammatory changes. Spleen: Normal in size without focal abnormality. Adrenals/Urinary Tract: Adrenal glands are unremarkable. Kidneys are normal, without renal calculi, focal lesion, or hydronephrosis. Bladder is unremarkable. Stomach/Bowel: No bowel obstruction or ileus. Normal appendix right lower quadrant. No bowel wall thickening or inflammatory change. Vascular/Lymphatic: Multiple borderline enlarged lymph nodes are again seen within the pelvis and inguinal regions. Index lymph nodes are as follows: Right external iliac chain, image 85/3, 14 mm short axis. Previously 9 mm. Right inguinal region, image 100/3, 14 mm.  Previously 12 mm. Left external iliac chain, image 91/3, 15 mm.  Previously 8 mm. No significant atherosclerosis. Reproductive: Prostate is unremarkable. Other: No abdominal wall hernia or abnormality. No abdominopelvic ascites. Musculoskeletal: No acute or destructive bony lesions. Reconstructed images demonstrate no additional findings. IMPRESSION: 1. Nonspecific pelvic lymphadenopathy.  Index lymph nodes as above. 2. Otherwise no acute intra-abdominal or intrapelvic process. 3. No urinary tract calculi or obstructive uropathy. Electronically  Signed   By: Randa Ngo M.D.   On: 04/25/2020 19:33    Assessment & Plan:   Problem List Items Addressed This Visit   None     Meds ordered this encounter  Medications   phentermine (ADIPEX-P) 37.5 MG tablet    Sig: Take 1 tablet (37.5 mg total) by mouth daily before breakfast.    Dispense:  90 tablet    Refill:  0   sildenafil (REVATIO) 20 MG tablet    Sig: TAKE 1 TO 4 TABLETS BY MOUTH ONCE DAILY AS NEEDED    Dispense:  80 tablet    Refill:  3   Vitamin D, Ergocalciferol, (DRISDOL) 1.25 MG (50000 UNIT) CAPS capsule    Sig: TAKE 1 CAPSULE BY MOUTH ONCE EVERY MONTH  Dispense:  3 capsule    Refill:  3   meloxicam (MOBIC) 15 MG tablet    Sig: Take 1 tablet (15 mg total) by mouth daily as needed for pain (pc).    Dispense:  90 tablet    Refill:  1      Follow-up: Return in about 6 months (around 03/20/2023) for Wellness Exam.  Walker Kehr, MD

## 2022-10-28 ENCOUNTER — Telehealth: Payer: Self-pay | Admitting: Internal Medicine

## 2022-10-28 MED ORDER — MELOXICAM 15 MG PO TABS: 15.0000 mg | ORAL_TABLET | Freq: Every day | ORAL | 1 refills | Status: DC | PRN

## 2022-10-28 MED ORDER — SILDENAFIL CITRATE 20 MG PO TABS: ORAL_TABLET | ORAL | 3 refills | Status: DC

## 2022-10-28 NOTE — Telephone Encounter (Signed)
Sent meloxicam & revatio. Pls advise on phentermine & Vit D../lmb

## 2022-10-28 NOTE — Telephone Encounter (Signed)
Patient called and asked if his medications can be sent to the [facility] in [place] instead. He called the pharmacy and they told him he had to call us. meloxicam (MOBIC) 15 MG tablet  phentermine (ADIPEX-P) 37.5 MG tablet  sildenafil (REVATIO) 20 MG tablet  Vitamin D, Ergocalciferol, (DRISDOL) 1.25 MG (50000 UNIT) CAPS capsule

## 2022-10-30 MED ORDER — PHENTERMINE HCL 37.5 MG PO TABS: 37.5000 mg | ORAL_TABLET | Freq: Every day | ORAL | 0 refills | Status: DC

## 2022-10-30 MED ORDER — VITAMIN D (ERGOCALCIFEROL) 1.25 MG (50000 UNIT) PO CAPS: ORAL_CAPSULE | ORAL | 3 refills | Status: DC

## 2022-10-30 MED ORDER — MELOXICAM 15 MG PO TABS: 15.0000 mg | ORAL_TABLET | Freq: Every day | ORAL | 1 refills | Status: DC | PRN

## 2022-10-30 MED ORDER — SILDENAFIL CITRATE 20 MG PO TABS: ORAL_TABLET | ORAL | 3 refills | Status: DC

## 2022-10-30 NOTE — Telephone Encounter (Signed)
Done. Thanks.

## 2022-11-14 ENCOUNTER — Ambulatory Visit: Payer: BC Managed Care – PPO | Admitting: Internal Medicine

## 2022-12-04 ENCOUNTER — Ambulatory Visit (INDEPENDENT_AMBULATORY_CARE_PROVIDER_SITE_OTHER): Payer: BC Managed Care – PPO | Admitting: Internal Medicine

## 2022-12-04 ENCOUNTER — Encounter: Payer: Self-pay | Admitting: Internal Medicine

## 2022-12-04 VITALS — BP 110/70 | HR 75 | Temp 98.6°F | Ht 72.0 in | Wt 237.0 lb

## 2022-12-04 DIAGNOSIS — N528 Other male erectile dysfunction: Secondary | ICD-10-CM

## 2022-12-04 DIAGNOSIS — R21 Rash and other nonspecific skin eruption: Secondary | ICD-10-CM | POA: Insufficient documentation

## 2022-12-04 DIAGNOSIS — E669 Obesity, unspecified: Secondary | ICD-10-CM

## 2022-12-04 DIAGNOSIS — Z8616 Personal history of COVID-19: Secondary | ICD-10-CM | POA: Insufficient documentation

## 2022-12-04 DIAGNOSIS — K589 Irritable bowel syndrome without diarrhea: Secondary | ICD-10-CM

## 2022-12-04 MED ORDER — TRIAMCINOLONE ACETONIDE 0.1 % EX CREA: 1.0000 | TOPICAL_CREAM | Freq: Three times a day (TID) | CUTANEOUS | 3 refills | Status: DC

## 2022-12-04 NOTE — Assessment & Plan Note (Signed)
On Revatio prn

## 2022-12-04 NOTE — Assessment & Plan Note (Signed)
No change 

## 2022-12-04 NOTE — Assessment & Plan Note (Signed)
Eczema Triamcinolone cream tid

## 2022-12-04 NOTE — Progress Notes (Signed)
Subjective:  Patient ID: Chad Spears, male    DOB: [birth year]  Age: 42 y.o. MRN: [id number]  CC: No chief complaint on file.   HPI Chad Spears presents for ED, obesity, Vit D def [employer] finished training for [employer]. He was sick with cough since Oct 13, 2022 in [place]. He was COVID (+). He is better now.  Outpatient Medications Prior to Visit  Medication Sig Dispense Refill   meloxicam (MOBIC) 15 MG tablet Take 1 tablet (15 mg total) by mouth daily as needed for pain (pc). 90 tablet 1   phentermine (ADIPEX-P) 37.5 MG tablet Take 1 tablet (37.5 mg total) by mouth daily before breakfast. 90 tablet 0   sildenafil (REVATIO) 20 MG tablet TAKE 1 TO 4 TABLETS BY MOUTH ONCE DAILY AS NEEDED 80 tablet 3   Vitamin D, Ergocalciferol, (DRISDOL) 1.25 MG (50000 UNIT) CAPS capsule TAKE 1 CAPSULE BY MOUTH ONCE EVERY MONTH 3 capsule 3   No facility-administered medications prior to visit.    ROS: Review of Systems  Constitutional:  Positive for fatigue. Negative for appetite change and unexpected weight change.  HENT:  Negative for congestion, nosebleeds, sneezing, sore throat and trouble swallowing.   Eyes:  Negative for itching and visual disturbance.  Respiratory:  Positive for cough.   Cardiovascular:  Negative for chest pain, palpitations and leg swelling.  Gastrointestinal:  Negative for abdominal distention, blood in stool, diarrhea and nausea.  Genitourinary:  Negative for frequency and hematuria.  Musculoskeletal:  Negative for back pain, gait problem, joint swelling and neck pain.  Skin:  Negative for rash.  Neurological:  Negative for dizziness, tremors, speech difficulty and weakness.  Psychiatric/Behavioral:  Negative for agitation, dysphoric mood and sleep disturbance. The patient is not nervous/anxious.     Objective:  BP 110/70 (BP Location: Left Arm, Patient Position: Sitting, Cuff Size: Normal)   Pulse 75   Temp 98.6 F (37 C) (Oral)   Ht 6' (1.829 m)   Wt 237 lb (107.5 kg)    SpO2 99%   BMI 32.14 kg/m   BP Readings from Last 3 Encounters:  12/04/22 110/70  09/19/22 112/80  09/04/21 112/80    Wt Readings from Last 3 Encounters:  12/04/22 237 lb (107.5 kg)  09/19/22 234 lb 9.6 oz (106.4 kg)  09/04/21 241 lb 6.4 oz (109.5 kg)    Physical Exam Constitutional:      General: He is not in acute distress.    Appearance: He is well-developed. He is obese.     Comments: NAD  Eyes:     Conjunctiva/sclera: Conjunctivae normal.     Pupils: Pupils are equal, round, and reactive to light.  Neck:     Thyroid: No thyromegaly.     Vascular: No JVD.  Cardiovascular:     Rate and Rhythm: Normal rate and regular rhythm.     Heart sounds: Normal heart sounds. No murmur heard.    No friction rub. No gallop.  Pulmonary:     Effort: Pulmonary effort is normal. No respiratory distress.     Breath sounds: Normal breath sounds. No wheezing or rales.  Chest:     Chest wall: No tenderness.  Abdominal:     General: Bowel sounds are normal. There is no distension.     Palpations: Abdomen is soft. There is no mass.     Tenderness: There is no abdominal tenderness. There is no guarding or rebound.  Musculoskeletal:        General: No  tenderness. Normal range of motion.     Cervical back: Normal range of motion.  Lymphadenopathy:     Cervical: No cervical adenopathy.  Skin:    General: Skin is warm and dry.     Findings: No rash.  Neurological:     Mental Status: He is alert and oriented to person, place, and time.     Cranial Nerves: No cranial nerve deficit.     Motor: No abnormal muscle tone.     Coordination: Coordination normal.     Gait: Gait normal.     Deep Tendon Reflexes: Reflexes are normal and symmetric.  Psychiatric:        Behavior: Behavior normal.        Thought Content: Thought content normal.        Judgment: Judgment normal.   rash  Lab Results  Component Value Date   WBC 8.2 04/25/2021   HGB 14.4 04/25/2021   HCT 43.6 04/25/2021   PLT  267.0 04/25/2021   GLUCOSE 89 04/25/2021   CHOL 177 04/25/2021   TRIG 41.0 04/25/2021   HDL 51.20 04/25/2021   LDLCALC 117 (H) 04/25/2021   ALT 36 04/25/2021   AST 32 04/25/2021   NA 137 04/25/2021   K 3.9 04/25/2021   CL 103 04/25/2021   CREATININE 0.77 04/25/2021   BUN 14 04/25/2021   CO2 26 04/25/2021   TSH 1.46 04/25/2021   PSA 0.55 03/21/2020   INR 1.08 08/23/2010    CT Renal Stone Study  Result Date: 04/25/2020 CLINICAL DATA:  Nausea and vomiting for 2 weeks, flank pain EXAM: CT ABDOMEN AND PELVIS WITHOUT CONTRAST TECHNIQUE: Multidetector CT imaging of the abdomen and pelvis was performed following the standard protocol without IV contrast. COMPARISON:  09/05/2011 FINDINGS: Lower chest: No acute pleural or parenchymal lung disease. Hepatobiliary: No focal liver abnormality is seen. No gallstones, gallbladder wall thickening, or biliary dilatation. Pancreas: Unremarkable. No pancreatic ductal dilatation or surrounding inflammatory changes. Spleen: Normal in size without focal abnormality. Adrenals/Urinary Tract: Adrenal glands are unremarkable. Kidneys are normal, without renal calculi, focal lesion, or hydronephrosis. Bladder is unremarkable. Stomach/Bowel: No bowel obstruction or ileus. Normal appendix right lower quadrant. No bowel wall thickening or inflammatory change. Vascular/Lymphatic: Multiple borderline enlarged lymph nodes are again seen within the pelvis and inguinal regions. Index lymph nodes are as follows: Right external iliac chain, image 85/3, 14 mm short axis. Previously 9 mm. Right inguinal region, image 100/3, 14 mm.  Previously 12 mm. Left external iliac chain, image 91/3, 15 mm.  Previously 8 mm. No significant atherosclerosis. Reproductive: Prostate is unremarkable. Other: No abdominal wall hernia or abnormality. No abdominopelvic ascites. Musculoskeletal: No acute or destructive bony lesions. Reconstructed images demonstrate no additional findings. IMPRESSION: 1.  Nonspecific pelvic lymphadenopathy.  Index lymph nodes as above. 2. Otherwise no acute intra-abdominal or intrapelvic process. 3. No urinary tract calculi or obstructive uropathy. Electronically Signed   By: Randa Ngo M.D.   On: 04/25/2020 19:33    Assessment & Plan:   Problem List Items Addressed This Visit       Digestive   IBS (irritable bowel syndrome)    No change        Musculoskeletal and Integument   Rash    Eczema Triamcinolone cream tid        Other   Erectile dysfunction    On Revatio prn      History of COVID-19 - Primary    He was sick with cough since Oct 13, 2022 in [place]. He was COVID (+). He is better now      Obesity (BMI 30.0-34.9)    On diet         Meds ordered this encounter  Medications   triamcinolone cream (KENALOG) 0.1 %    Sig: Apply 1 Application topically 3 (three) times daily.    Dispense:  80 g    Refill:  3      Follow-up: Return in about 3 months (around 03/05/2023) for a follow-up visit.  Walker Kehr, MD

## 2022-12-04 NOTE — Assessment & Plan Note (Signed)
He was sick with cough since Oct 13, 2022 in [place]. He was COVID (+). He is better now

## 2022-12-05 ENCOUNTER — Emergency Department (HOSPITAL_COMMUNITY)
Admission: EM | Admit: 2022-12-05 | Discharge: 2022-12-05 | Disposition: A | Discharge status: Home / Self Care | Payer: BC Managed Care – PPO | Attending: Emergency Medicine | Admitting: Emergency Medicine

## 2022-12-05 ENCOUNTER — Other Ambulatory Visit: Payer: Self-pay

## 2022-12-05 ENCOUNTER — Emergency Department (HOSPITAL_COMMUNITY): Payer: BC Managed Care – PPO

## 2022-12-05 DIAGNOSIS — R110 Nausea: Secondary | ICD-10-CM | POA: Insufficient documentation

## 2022-12-05 DIAGNOSIS — R1084 Generalized abdominal pain: Secondary | ICD-10-CM | POA: Insufficient documentation

## 2022-12-05 DIAGNOSIS — R630 Anorexia: Secondary | ICD-10-CM | POA: Insufficient documentation

## 2022-12-05 LAB — COMPREHENSIVE METABOLIC PANEL
ALT: 22 U/L (ref 0–44)
AST: 24 U/L (ref 15–41)
Albumin: 4 g/dL (ref 3.5–5.0)
Alkaline Phosphatase: 82 U/L (ref 38–126)
Anion gap: 12 (ref 5–15)
BUN: 16 mg/dL (ref 6–20)
CO2: 20 mmol/L — ABNORMAL LOW (ref 22–32)
Calcium: 9.3 mg/dL (ref 8.9–10.3)
Chloride: 104 mmol/L (ref 98–111)
Creatinine, Ser: 0.89 mg/dL (ref 0.61–1.24)
GFR, Estimated: >60 mL/min (ref >60)
Glucose, Bld: 114 mg/dL — ABNORMAL HIGH (ref 70–99)
Potassium: 4.3 mmol/L (ref 3.5–5.1)
Sodium: 136 mmol/L (ref 135–145)
Total Bilirubin: 0.5 mg/dL (ref 0.3–1.2)
Total Protein: 6.9 g/dL (ref 6.5–8.1)

## 2022-12-05 LAB — URINALYSIS, ROUTINE W REFLEX MICROSCOPIC
Bilirubin Urine: NEGATIVE
Glucose, UA: NEGATIVE mg/dL
Hgb urine dipstick: NEGATIVE
Ketones, ur: NEGATIVE mg/dL
Leukocytes,Ua: NEGATIVE
Nitrite: NEGATIVE
Protein, ur: NEGATIVE mg/dL
Specific Gravity, Urine: 1.032 — ABNORMAL HIGH (ref 1.005–1.030)
pH: 5 (ref 5.0–8.0)

## 2022-12-05 LAB — CBC
HCT: 43.8 % (ref 39.0–52.0)
Hemoglobin: 14.3 g/dL (ref 13.0–17.0)
MCH: 26 pg (ref 26.0–34.0)
MCHC: 32.6 g/dL (ref 30.0–36.0)
MCV: 79.6 fL — ABNORMAL LOW (ref 80.0–100.0)
Platelets: 340 10*3/uL (ref 150–400)
RBC: 5.5 MIL/uL (ref 4.22–5.81)
RDW: 13.9 % (ref 11.5–15.5)
WBC: 7.3 10*3/uL (ref 4.0–10.5)
nRBC: 0 % (ref 0.0–0.2)

## 2022-12-05 LAB — LIPASE, BLOOD: Lipase: 29 U/L (ref 11–51)

## 2022-12-05 MED ORDER — KETOROLAC TROMETHAMINE 15 MG/ML IJ SOLN
15.0000 mg | Freq: Once | INTRAMUSCULAR | Status: AC
  Administered 2022-12-05: 15 mg via INTRAVENOUS
  Filled 2022-12-05: qty 1

## 2022-12-05 MED ORDER — METOCLOPRAMIDE HCL 5 MG/ML IJ SOLN
10.0000 mg | Freq: Once | INTRAMUSCULAR | Status: AC
  Administered 2022-12-05: 10 mg via INTRAVENOUS
  Filled 2022-12-05: qty 2

## 2022-12-05 MED ORDER — DICYCLOMINE HCL 20 MG PO TABS: 20.0000 mg | ORAL_TABLET | Freq: Two times a day (BID) | ORAL | 0 refills | Status: DC

## 2022-12-05 MED ORDER — ONDANSETRON 4 MG PO TBDP: 4.0000 mg | ORAL_TABLET | Freq: Three times a day (TID) | ORAL | 0 refills | Status: DC | PRN

## 2022-12-05 MED ORDER — DICYCLOMINE HCL 10 MG PO CAPS
10.0000 mg | ORAL_CAPSULE | Freq: Once | ORAL | Status: AC
  Administered 2022-12-05: 10 mg via ORAL
  Filled 2022-12-05: qty 1

## 2022-12-05 MED ORDER — ONDANSETRON 4 MG PO TBDP
4.0000 mg | ORAL_TABLET | Freq: Once | ORAL | Status: AC
  Administered 2022-12-05: 4 mg via ORAL
  Filled 2022-12-05: qty 1

## 2022-12-05 MED ORDER — SODIUM CHLORIDE 0.9 % IV BOLUS
1000.0000 mL | Freq: Once | INTRAVENOUS | Status: AC
  Administered 2022-12-05: 1000 mL via INTRAVENOUS

## 2022-12-05 NOTE — ED Provider Notes (Signed)
[facility] Provider Note   CSN: [id number] Arrival date & time: 12/05/22  4193     History  Chief Complaint  Patient presents with   Abdominal Pain   Nausea    Chad Spears is a 42 y.o. male.  HPI Presents with abdominal pain.  Pains been present for days to weeks, is worse today.  There is associated anorexia, generalized discomfort, but no actual vomiting, no fever, no diarrhea.  No clear alleviating or exacerbating factors.    Home Medications Prior to Admission medications   Medication Sig Start Date End Date Taking? Authorizing Provider  meloxicam (MOBIC) 15 MG tablet Take 1 tablet (15 mg total) by mouth daily as needed for pain (pc). 10/30/22   Plotnikov, Evie Lacks, MD  phentermine (ADIPEX-P) 37.5 MG tablet Take 1 tablet (37.5 mg total) by mouth daily before breakfast. 10/30/22   Plotnikov, Evie Lacks, MD  sildenafil (REVATIO) 20 MG tablet TAKE 1 TO 4 TABLETS BY MOUTH ONCE DAILY AS NEEDED 10/30/22   Plotnikov, Evie Lacks, MD  triamcinolone cream (KENALOG) 0.1 % Apply 1 Application topically 3 (three) times daily. 12/04/22   Plotnikov, Evie Lacks, MD  Vitamin D, Ergocalciferol, (DRISDOL) 1.25 MG (50000 UNIT) CAPS capsule TAKE 1 CAPSULE BY MOUTH ONCE EVERY MONTH 10/30/22   Plotnikov, Evie Lacks, MD      Allergies    Amitiza [lubiprostone] and Iohexol    Review of Systems   Review of Systems  All other systems reviewed and are negative.   Physical Exam Updated Vital Signs BP 96/60 (BP Location: Right Arm)   Pulse 81   Temp 98 F (36.7 C) (Oral)   Resp 16   Ht 6' (1.829 m)   Wt 103 kg   SpO2 100%   BMI 30.79 kg/m  Physical Exam Vitals and nursing note reviewed.  Constitutional:      General: He is not in acute distress.    Appearance: He is well-developed.  HENT:     Head: Normocephalic and atraumatic.  Eyes:     Conjunctiva/sclera: Conjunctivae normal.  Cardiovascular:     Rate and Rhythm: Normal rate and regular  rhythm.  Pulmonary:     Effort: Pulmonary effort is normal. No respiratory distress.     Breath sounds: No stridor.  Abdominal:     General: There is no distension.     Tenderness: There is generalized abdominal tenderness. There is guarding.  Skin:    General: Skin is warm and dry.  Neurological:     Mental Status: He is alert and oriented to person, place, and time.     ED Results / Procedures / Treatments   Labs (all labs ordered are listed, but only abnormal results are displayed) Labs Reviewed  COMPREHENSIVE METABOLIC PANEL - Abnormal; Notable for the following components:      Result Value   CO2 20 (*)    Glucose, Bld 114 (*)    All other components within normal limits  CBC - Abnormal; Notable for the following components:   MCV 79.6 (*)    All other components within normal limits  URINALYSIS, ROUTINE W REFLEX MICROSCOPIC - Abnormal; Notable for the following components:   Specific Gravity, Urine 1.032 (*)    All other components within normal limits  LIPASE, BLOOD    EKG None  Radiology CT ABDOMEN PELVIS WO CONTRAST  Result Date: 12/05/2022 CLINICAL DATA:  Acute generalized abdominal pain. EXAM: CT ABDOMEN AND PELVIS WITHOUT  CONTRAST TECHNIQUE: Multidetector CT imaging of the abdomen and pelvis was performed following the standard protocol without IV contrast. RADIATION DOSE REDUCTION: This exam was performed according to the departmental dose-optimization program which includes automated exposure control, adjustment of the mA and/or kV according to patient size and/or use of iterative reconstruction technique. COMPARISON:  April 25, 2020. FINDINGS: Lower chest: No acute abnormality. Hepatobiliary: No focal liver abnormality is seen. No gallstones, gallbladder wall thickening, or biliary dilatation. Pancreas: Unremarkable. No pancreatic ductal dilatation or surrounding inflammatory changes. Spleen: Normal in size without focal abnormality. Adrenals/Urinary Tract: Adrenal  glands are unremarkable. Kidneys are normal, without renal calculi, focal lesion, or hydronephrosis. Bladder is unremarkable. Stomach/Bowel: Stomach is within normal limits. Appendix appears normal. No evidence of bowel wall thickening, distention, or inflammatory changes. Vascular/Lymphatic: No significant vascular findings are present. Bilateral inguinal adenopathy noted on prior exam is significantly smaller currently. However, 14 mm left external iliac lymph node is noted which is enlarged compared to prior exam. Stable appearance of 13 mm right external iliac lymph node. Reproductive: Prostate is unremarkable. Other: No abdominal wall hernia or abnormality. No abdominopelvic ascites. Musculoskeletal: No acute or significant osseous findings. IMPRESSION: Bilateral inguinal adenopathy noted on prior exam is significantly smaller and most likely reactive in etiology. Stable 13 mm right external iliac lymph node is noted as well, most likely reactive is well. However, there is a 14 mm left external iliac lymph node which is significantly enlarged compared to prior exam. While this most likely is reactive or benign in etiology, neoplasm cannot be excluded. Follow-up CT scan in 3-6 months is recommended to ensure stability. Electronically Signed   By: Marijo Conception M.D.   On: 12/05/2022 12:37    Procedures Procedures    Medications Ordered in ED Medications  ondansetron (ZOFRAN-ODT) disintegrating tablet 4 mg (4 mg Oral Given 12/05/22 0914)  sodium chloride 0.9 % bolus 1,000 mL (1,000 mLs Intravenous Bolus 12/05/22 1424)  dicyclomine (BENTYL) capsule 10 mg (10 mg Oral Given 12/05/22 1424)  ketorolac (TORADOL) 15 MG/ML injection 15 mg (15 mg Intravenous Given 12/05/22 1424)  metoCLOPramide (REGLAN) injection 10 mg (10 mg Intravenous Given 12/05/22 1426)    ED Course/ Medical Decision Making/ A&P                             Medical Decision Making Multiple medical problems including hep B, hep C,  adenopathy now presents with abdominal pain.  With his history differential including infection, bacteremia, sepsis, obstruction, mass all considerations.  Patient received fluids, antiemetics, CT, labs after evaluation.  Amount and/or Complexity of Data Reviewed External Data Reviewed: notes. Labs: ordered. Decision-making details documented in ED Course. Radiology: ordered and independent interpretation performed.  Risk OTC drugs. Prescription drug management. Decision regarding hospitalization.   3:08 PM Patient in somewhat similar condition. Initial scans reviewed, labs reviewed, discussed, essentially labs are unremarkable, no acidosis, no electrolyte abnormalities.  CT similarly reassuring, without acute findings, with demonstration of adenopathy.  Patient has had this in the past, but now he seems as though he has developed contralateral lymph node enlargement.  Patient aware of all findings.  This adult male presents with ongoing abdominal pain, nausea.  Some suspicion for enteritis, recently given the reassuring labs, CT scan, but with ongoing nausea, vomiting required fluids, antiemetics, analgesics, IV, monitoring, management, was eventually appropriate for discharge the hospitalization was a consideration.        Final Clinical Impression(s) /  ED Diagnoses Final diagnoses:  Generalized abdominal pain  Nausea    Rx / DC Orders ED Discharge Orders     None         Carmin Muskrat, MD 12/05/22 1510

## 2022-12-05 NOTE — ED Provider Triage Note (Signed)
Emergency Medicine Provider Triage Evaluation Note  Chad Spears , a 42 y.o. male  was evaluated in triage.  Pt complains of diffuse abdominal pain, nausea, anorexia, generalized discomfort.  Review of Systems  Positive: HPI Negative: Fever, diarrhea, actual vomiting  Physical Exam  BP 109/77 (BP Location: Right Arm)   Pulse 81   Temp 97.6 F (36.4 C) (Oral)   Resp 16   Ht 6' (1.829 m)   Wt 103 kg   SpO2 98%   BMI 30.79 kg/m  Gen:   Awake, no distress speaking clearly Resp:  Normal effort no increased work of breathing MSK:   Moves extremities without difficulty no deformity Other:  Abdominal exam notable for diffuse tenderness without focality  Medical Decision Making  Medically screening exam initiated at 9:16 AM.  Appropriate orders placed.  Chad Spears was informed that the remainder of the evaluation will be completed by another provider, this initial triage assessment does not replace that evaluation, and the importance of remaining in the ED until their evaluation is complete.   Carmin Muskrat, MD 12/05/22 [phone]

## 2022-12-05 NOTE — ED Triage Notes (Signed)
Pt, stated, Ive had stomach pain with bloating making all these noises and nausea FOR 3 WEEKS. My body is getting weaker.

## 2022-12-05 NOTE — Discharge Instructions (Signed)
As discussed, please stay well hydrated, and to monitor your condition carefully.  Do not hesitate to return here for concerning changes.  Today's evaluation has been generally reassuring.  There are, however, some enlarged lymph nodes in your abdomen, which you have also had previously.  Please discuss this with your physician and schedule appropriate follow-up.  Below is the interpretation of today's CT scan.  Please take this paper with you when you follow-up with your physician.  IMPRESSION: Bilateral inguinal adenopathy noted on prior exam is significantly smaller and most likely reactive in etiology. Stable 13 mm right external iliac lymph node is noted as well, most likely reactive is well. However, there is a 14 mm left external iliac lymph node which is significantly enlarged compared to prior exam. While this most likely is reactive or benign in etiology, other causes cannot be excluded. Follow-up CT scan in 3-6 months is recommended to ensure stability.

## 2022-12-06 ENCOUNTER — Telehealth: Payer: Self-pay

## 2022-12-06 NOTE — Telephone Encounter (Signed)
Transition Care Management Unsuccessful Follow-up Telephone Call  Date of discharge and from where:  12/05/22 [facility]  Attempts:  1st Attempt  Reason for unsuccessful TCM follow-up call:  Unable to reach patient

## 2022-12-09 NOTE — Telephone Encounter (Signed)
Transition Care Management Follow-up Telephone Call Date of discharge and from where: [facility] How have you been since you were released from the hospital? I feel a little bit better. Staying hydrated. Denies worsening pain or any new symptoms. Eating/drinking okay. Returning back to work.  Any questions or concerns? No  Items Reviewed: Did the pt receive and understand the discharge instructions provided? Yes  Medications obtained and verified? Yes  Other? No  Any new allergies since your discharge? No  Dietary orders reviewed? Yes Do you have support at home? Yes   Home Care and Equipment/Supplies: Were home health services ordered? no If so, what is the name of the agency? N/a  Has the agency set up a time to come to the patient's home? not applicable Were any new equipment or medical supplies ordered?  No What is the name of the medical supply agency? N/a Were you able to get the supplies/equipment? not applicable Do you have any questions related to the use of the equipment or supplies? No  Functional Questionnaire: (I = Independent and D = Dependent) ADLs: I  Bathing/Dressing- I  Meal Prep- I  Eating- I  Maintaining continence- I  Transferring/Ambulation- I  Managing Meds- I  Follow up appointments reviewed:  PCP Hospital f/u appt confirmed? Patient plans to call and schedule. Declines scheduling at this time.  [facility] f/u appt confirmed? N/a. Are transportation arrangements needed? No  If their condition worsens, is the pt aware to call PCP or go to the Emergency Dept.? Yes Was the patient provided with contact information for the PCP's office or ED? Yes Was to pt encouraged to call back with questions or concerns? Yes

## 2022-12-19 ENCOUNTER — Encounter: Payer: Self-pay | Admitting: Internal Medicine

## 2022-12-31 ENCOUNTER — Ambulatory Visit: Payer: BC Managed Care – PPO | Admitting: Internal Medicine

## 2023-01-02 ENCOUNTER — Ambulatory Visit: Payer: Self-pay | Admitting: Internal Medicine

## 2023-01-04 ENCOUNTER — Emergency Department (HOSPITAL_COMMUNITY)
Admission: EM | Admit: 2023-01-04 | Discharge: 2023-01-04 | Disposition: A | Discharge status: Home / Self Care | Payer: Self-pay | Attending: Emergency Medicine | Admitting: Emergency Medicine

## 2023-01-04 ENCOUNTER — Encounter (HOSPITAL_COMMUNITY): Payer: Self-pay

## 2023-01-04 ENCOUNTER — Other Ambulatory Visit: Payer: Self-pay

## 2023-01-04 DIAGNOSIS — R109 Unspecified abdominal pain: Secondary | ICD-10-CM | POA: Insufficient documentation

## 2023-01-04 DIAGNOSIS — Z20822 Contact with and (suspected) exposure to covid-19: Secondary | ICD-10-CM | POA: Insufficient documentation

## 2023-01-04 DIAGNOSIS — R112 Nausea with vomiting, unspecified: Secondary | ICD-10-CM

## 2023-01-04 DIAGNOSIS — R21 Rash and other nonspecific skin eruption: Secondary | ICD-10-CM

## 2023-01-04 LAB — COMPREHENSIVE METABOLIC PANEL
ALT: 24 U/L (ref 0–44)
AST: 23 U/L (ref 15–41)
Albumin: 3.7 g/dL (ref 3.5–5.0)
Alkaline Phosphatase: 91 U/L (ref 38–126)
Anion gap: 7 (ref 5–15)
BUN: 14 mg/dL (ref 6–20)
CO2: 27 mmol/L (ref 22–32)
Calcium: 8.8 mg/dL — ABNORMAL LOW (ref 8.9–10.3)
Chloride: 103 mmol/L (ref 98–111)
Creatinine, Ser: 0.9 mg/dL (ref 0.61–1.24)
GFR, Estimated: >60 mL/min (ref >60)
Glucose, Bld: 100 mg/dL — ABNORMAL HIGH (ref 70–99)
Potassium: 4.2 mmol/L (ref 3.5–5.1)
Sodium: 137 mmol/L (ref 135–145)
Total Bilirubin: 0.6 mg/dL (ref 0.3–1.2)
Total Protein: 6.9 g/dL (ref 6.5–8.1)

## 2023-01-04 LAB — CBC WITH DIFFERENTIAL/PLATELET
Abs Immature Granulocytes: 0.01 10*3/uL (ref 0.00–0.07)
Basophils Absolute: 0.1 10*3/uL (ref 0.0–0.1)
Basophils Relative: 1 %
Eosinophils Absolute: 0.3 10*3/uL (ref 0.0–0.5)
Eosinophils Relative: 4 %
HCT: 44.2 % (ref 39.0–52.0)
Hemoglobin: 14.1 g/dL (ref 13.0–17.0)
Immature Granulocytes: 0 %
Lymphocytes Relative: 37 %
Lymphs Abs: 2.8 10*3/uL (ref 0.7–4.0)
MCH: 25.3 pg — ABNORMAL LOW (ref 26.0–34.0)
MCHC: 31.9 g/dL (ref 30.0–36.0)
MCV: 79.2 fL — ABNORMAL LOW (ref 80.0–100.0)
Monocytes Absolute: 0.7 10*3/uL (ref 0.1–1.0)
Monocytes Relative: 10 %
Neutro Abs: 3.6 10*3/uL (ref 1.7–7.7)
Neutrophils Relative %: 48 %
Platelets: 327 10*3/uL (ref 150–400)
RBC: 5.58 MIL/uL (ref 4.22–5.81)
RDW: 14 % (ref 11.5–15.5)
WBC: 7.5 10*3/uL (ref 4.0–10.5)
nRBC: 0 % (ref 0.0–0.2)

## 2023-01-04 LAB — LIPASE, BLOOD: Lipase: 33 U/L (ref 11–51)

## 2023-01-04 LAB — RESP PANEL BY RT-PCR (RSV, FLU A&B, COVID)  RVPGX2
Influenza A by PCR: NEGATIVE
Influenza B by PCR: NEGATIVE
Resp Syncytial Virus by PCR: NEGATIVE
SARS Coronavirus 2 by RT PCR: NEGATIVE

## 2023-01-04 MED ORDER — ONDANSETRON 4 MG PO TBDP: 4.0000 mg | ORAL_TABLET | Freq: Three times a day (TID) | ORAL | 0 refills | Status: DC | PRN

## 2023-01-04 MED ORDER — DIPHENHYDRAMINE HCL 25 MG PO CAPS
25.0000 mg | ORAL_CAPSULE | Freq: Once | ORAL | Status: AC
  Administered 2023-01-04: 25 mg via ORAL
  Filled 2023-01-04: qty 1

## 2023-01-04 MED ORDER — ONDANSETRON 4 MG PO TBDP
4.0000 mg | ORAL_TABLET | Freq: Once | ORAL | Status: AC
  Administered 2023-01-04: 4 mg via ORAL
  Filled 2023-01-04: qty 1

## 2023-01-04 MED ORDER — SODIUM CHLORIDE 0.9 % IV BOLUS
1000.0000 mL | Freq: Once | INTRAVENOUS | Status: AC
  Administered 2023-01-04: 1000 mL via INTRAVENOUS

## 2023-01-04 NOTE — ED Provider Notes (Signed)
Long Provider Note   CSN: [id number] Arrival date & time: 01/04/23  1114     History  Chief Complaint  Patient presents with   bodyaches    Chad Spears is a 42 y.o. male.  With a significant past medical history including but not limited to Anxiety, IBS, psychosomatic disorder, hepatitis, lymphedema who presents to the ED for evaluation of nausea, vomiting and rash.  He states his symptoms began 2 days ago and have persisted.  He has had 1 episode of nonbloody emesis per day.  He has been feeling nauseous fairly consistently during that time.  He reports normal bowel movements.  Denies melena or hematochezia.  He states he has had similar symptoms "many years ago" and that they did not find anything wrong and his symptoms eventually subsided on their own.  He has been able to eat and drink, however has been eating less than he normally does.  He does report associated abdominal pain which he describes as minimal.  He localizes this to bilateral flanks.  He reports objective fevers but denies chills or rigors.  Denies cough, chest pain, shortness of breath.  He reports his rash began at the time of symptom onset.  Describes this as itchy.  Has not taken anything for his symptoms at home.  HPI     Home Medications Prior to Admission medications   Medication Sig Start Date End Date Taking? Authorizing Provider  dicyclomine (BENTYL) 20 MG tablet Take 1 tablet (20 mg total) by mouth 2 (two) times daily for 5 days. 12/05/22 12/10/22  Carmin Muskrat, MD  meloxicam (MOBIC) 15 MG tablet Take 1 tablet (15 mg total) by mouth daily as needed for pain (pc). 10/30/22   Plotnikov, Evie Lacks, MD  ondansetron (ZOFRAN-ODT) 4 MG disintegrating tablet Take 1 tablet (4 mg total) by mouth every 8 (eight) hours as needed for nausea or vomiting. 01/04/23   Edwena Mayorga, Grafton Folk, PA-C  phentermine (ADIPEX-P) 37.5 MG tablet Take 1 tablet (37.5 mg total) by mouth  daily before breakfast. 10/30/22   Plotnikov, Evie Lacks, MD  sildenafil (REVATIO) 20 MG tablet TAKE 1 TO 4 TABLETS BY MOUTH ONCE DAILY AS NEEDED 10/30/22   Plotnikov, Evie Lacks, MD  triamcinolone cream (KENALOG) 0.1 % Apply 1 Application topically 3 (three) times daily. 12/04/22   Plotnikov, Evie Lacks, MD  Vitamin D, Ergocalciferol, (DRISDOL) 1.25 MG (50000 UNIT) CAPS capsule TAKE 1 CAPSULE BY MOUTH ONCE EVERY MONTH 10/30/22   Plotnikov, Evie Lacks, MD      Allergies    Amitiza [lubiprostone] and Iohexol    Review of Systems   Review of Systems  Gastrointestinal:  Positive for nausea and vomiting.  Skin:  Positive for rash.  All other systems reviewed and are negative.   Physical Exam Updated Vital Signs BP (!) 122/90 (BP Location: Right Arm)   Pulse 75   Temp 98.3 F (36.8 C) (Oral)   Resp 16   Ht 6' (1.829 m)   Wt 103 kg   SpO2 100%   BMI 30.80 kg/m  Physical Exam Vitals and nursing note reviewed.  Constitutional:      General: He is not in acute distress.    Appearance: Normal appearance. He is well-developed. He is not ill-appearing, toxic-appearing or diaphoretic.     Comments: Resting comfortably in bed  HENT:     Head: Normocephalic and atraumatic.     Mouth/Throat:     Mouth: Mucous  membranes are moist.     Pharynx: Oropharynx is clear. No oropharyngeal exudate or posterior oropharyngeal erythema.  Eyes:     Conjunctiva/sclera: Conjunctivae normal.  Cardiovascular:     Rate and Rhythm: Normal rate and regular rhythm.     Heart sounds: No murmur heard. Pulmonary:     Effort: Pulmonary effort is normal. No respiratory distress.     Breath sounds: Normal breath sounds. No stridor. No wheezing, rhonchi or rales.  Abdominal:     Palpations: Abdomen is soft.     Tenderness: There is no abdominal tenderness. There is no guarding.  Musculoskeletal:        General: No swelling.     Cervical back: Neck supple.  Skin:    General: Skin is warm and dry.     Capillary  Refill: Capillary refill takes less than 2 seconds.     Comments: Multiple small white papules to bilateral axilla with excoriations.  No erythema  Neurological:     Mental Status: He is alert.  Psychiatric:        Mood and Affect: Mood normal.     ED Results / Procedures / Treatments   Labs (all labs ordered are listed, but only abnormal results are displayed) Labs Reviewed  CBC WITH DIFFERENTIAL/PLATELET - Abnormal; Notable for the following components:      Result Value   MCV 79.2 (*)    MCH 25.3 (*)    All other components within normal limits  COMPREHENSIVE METABOLIC PANEL - Abnormal; Notable for the following components:   Glucose, Bld 100 (*)    Calcium 8.8 (*)    All other components within normal limits  RESP PANEL BY RT-PCR (RSV, FLU A&B, COVID)  RVPGX2  LIPASE, BLOOD    EKG None  Radiology No results found.  Procedures Procedures    Medications Ordered in ED Medications  ondansetron (ZOFRAN-ODT) disintegrating tablet 4 mg (4 mg Oral Given 01/04/23 1254)  diphenhydrAMINE (BENADRYL) capsule 25 mg (25 mg Oral Given 01/04/23 1254)  sodium chloride 0.9 % bolus 1,000 mL (1,000 mLs Intravenous New Bag/Given 01/04/23 1405)    ED Course/ Medical Decision Making/ A&P                             Medical Decision Making Amount and/or Complexity of Data Reviewed Labs: ordered.  Risk Prescription drug management.  This patient presents to the ED for concern of nausea, vomiting, this involves an extensive number of treatment options, and is a complaint that carries with it a high risk of complications and morbidity.  The emergent differential diagnosis for vomiting includes, but is not limited to ACS/MI, DKA, Ischemic bowel, Meningitis, Sepsis, Acute gastric dilation, Adrenal insufficiency, Appendicitis,  Bowel obstruction/ileus, Carbon monoxide poisoning, Cholecystitis, Electrolyte abnormalities, Elevated ICP, Gastric outlet obstruction, Pancreatitis, Ruptured viscus,  Biliary colic, Cannabinoid hyperemesis syndrome, Gastritis, Gastroenteritis, Gastroparesis,  Narcotic withdrawal, Peptic ulcer disease, and UTI   Co morbidities that complicate the patient evaluation  Anxiety, IBS, psychosomatic disorder, hepatitis, lymphedema  My initial workup includes abdominal pain labs, Zofran, Benadryl, fluids  Additional history obtained from: Nursing notes from this visit. Previous records within EMR system ED visit for similar on 12/06/2019  I ordered, reviewed and interpreted labs which include: CBC, CMP, lipase, respiratory panel labs unremarkable  Afebrile, hemodynamically stable.  42 year old male presenting to the ED for evaluation of nausea, vomiting, body aches, rash.  On exam he has some white papules in  bilateral axilla, however this may be due to excoriation as he reports itching.  He reported improvement in his symptoms with Benadryl.  Unclear as to the cause of his rash, however does not appear to be significant at this time.  He reported very mild abdominal pain associated with his nausea and vomiting.  He only reported 1 episode of emesis per day over the last 2 days.  His nausea got significantly better with p.o. Zofran.  He states he ran out of the Zofran that he was given 1 month ago.  Refill will be sent.  Was also requesting IV fluids.  This was given.  He reported feeling better after this as well.  Was able to eat and drink in the ED.  No indication for advanced imaging at this time.  He does have a fairly extensive history of similar issues on chart review.  He states he will follow-up with his primary care provider next week.  He was given return precautions.  Stable at discharge.  At this time there does not appear to be any evidence of an acute emergency medical condition and the patient appears stable for discharge with appropriate outpatient follow up. Diagnosis was discussed with patient who verbalizes understanding of care plan and is agreeable to  discharge. I have discussed return precautions with patient who verbalizes understanding. Patient encouraged to follow-up with their PCP within 1 week. All questions answered.  Note: Portions of this report may have been transcribed using voice recognition software. Every effort was made to ensure accuracy; however, inadvertent computerized transcription errors may still be present.        Final Clinical Impression(s) / ED Diagnoses Final diagnoses:  Nausea and vomiting, unspecified vomiting type  Rash and nonspecific skin eruption    Rx / DC Orders ED Discharge Orders          Ordered    ondansetron (ZOFRAN-ODT) 4 MG disintegrating tablet  Every 8 hours PRN        01/04/23 1504              Nehemiah Massed 01/04/23 1510    Dorie Rank, MD 01/05/23 [id number]

## 2023-01-04 NOTE — ED Triage Notes (Signed)
Pt c/o bodyaches, nausea, rash under both axilla and abdomen and fatiguex4d.

## 2023-01-04 NOTE — Discharge Instructions (Addendum)
You have been seen today for your complaint of nausea, vomiting and rash. Your lab work was reassuring and showed no abnormalities. Your discharge medications include Zofran.  This is a nausea medicine.  Take it as needed in order to eat and drink normal diet. Follow up with: Your primary care provider for further evaluation of your symptoms Please seek immediate medical care if you develop any of the following symptoms: You have pain in your chest, neck, arm, or jaw. You feel extremely weak or you faint. You have persistent vomiting. You have vomit that is bright red or looks like black coffee grounds. You have bloody or black stools (feces) or stools that look like tar. You have a severe headache, a stiff neck, or both. You have severe pain, cramping, or bloating in your abdomen. You have difficulty breathing, or you are breathing very quickly. Your heart is beating very quickly. Your skin feels cold and clammy. You feel confused. You have signs of dehydration, such as: Dark urine, very little urine, or no urine. Cracked lips. Dry mouth. Sunken eyes. Sleepiness. Weakness. At this time there does not appear to be the presence of an emergent medical condition, however there is always the potential for conditions to change. Please read and follow the below instructions.  Do not take your medicine if  develop an itchy rash, swelling in your mouth or lips, or difficulty breathing; call 911 and seek immediate emergency medical attention if this occurs.  You may review your lab tests and imaging results in their entirety on your MyChart account.  Please discuss all results of fully with your primary care provider and other specialist at your follow-up visit.  Note: Portions of this text may have been transcribed using voice recognition software. Every effort was made to ensure accuracy; however, inadvertent computerized transcription errors may still be present.

## 2023-01-04 NOTE — ED Provider Triage Note (Signed)
Emergency Medicine Provider Triage Evaluation Note  Chad Spears , a 42 y.o. male  was evaluated in triage.  Pt complains of bodyaches and rash for the past 4 days.  Patient states he tried ibuprofen and no avail.  Patient states he has had night sweats and chills but no fever.  Patient denies sick contacts.  Patient states he has noticed bumps along his abdomen going up to his arms that are new.  Bumps are not pruritic or painful.  Patient denies chest pain, shortness of breath, syncope, loss sensation/motor skills  Review of Systems  Positive: See HPI Negative: See HPI  Physical Exam  BP (!) 122/90 (BP Location: Right Arm)   Pulse 75   Temp 98.3 F (36.8 C) (Oral)   Resp 16   Ht 6' (1.829 m)   Wt 103 kg   SpO2 100%   BMI 30.80 kg/m  Gen:   Awake, no distress   Resp:  Normal effort  MSK:   Moves extremities without difficulty  Other:  Nonerythematous papules along the mid axillary sides of the abdomen going up to the armpit that were nontender to palpation  Medical Decision Making  Medically screening exam initiated at 11:28 AM.  Appropriate orders placed.  Chad Spears was informed that the remainder of the evaluation will be completed by another provider, this initial triage assessment does not replace that evaluation, and the importance of remaining in the ED until their evaluation is complete.  Workup initiated, strongly suspect this is URI, patient stable at this time   Rasheed, [place] 01/04/23 1134

## 2023-01-06 ENCOUNTER — Telehealth: Payer: Self-pay

## 2023-01-06 NOTE — Transitions of Care (Post Inpatient/ED Visit) (Unsigned)
   01/06/2023  Name: MOX YERBY MRN: [id number] DOB: January 26, 1981  Today's TOC FU Call Status: Today's TOC FU Call Status:: Unsuccessul Call (1st Attempt) Unsuccessful Call (1st Attempt) Date: 01/06/23  Attempted to reach the patient regarding the most recent Inpatient/ED visit.  Follow Up Plan: Additional outreach attempts will be made to reach the patient to complete the Transitions of Care (Post Inpatient/ED visit) call.   Signature  Juanda Crumble, [place] Direct Dial [phone]

## 2023-01-08 NOTE — Transitions of Care (Post Inpatient/ED Visit) (Signed)
   01/08/2023  Name: Chad Spears MRN: [id number] DOB: Apr 12, 1981  Today's TOC FU Call Status: Today's TOC FU Call Status:: Successful TOC FU Call Competed Unsuccessful Call (1st Attempt) Date: 01/06/23 [place] FU Call Complete Date: 01/08/23  Transition Care Management Follow-up Telephone Call Date of Discharge: 01/04/23 Discharge [facility]) Type of Discharge: Emergency Department Reason for ED Visit: Other: (nausea and vomiting) How have you been since you were released from the hospital?: Better Any questions or concerns?: No  Items Reviewed: Did you receive and understand the discharge instructions provided?: Yes Medications obtained and verified?: Yes (Medications Reviewed) Any new allergies since your discharge?: No Dietary orders reviewed?: Yes Do you have support at home?: Yes People in Home: spouse  Home Care and Equipment/Supplies: [place] Ordered?: NA Any new equipment or medical supplies ordered?: NA  Functional Questionnaire: Do you need assistance with bathing/showering or dressing?: No Do you need assistance with meal preparation?: No Do you need assistance with eating?: No Do you have difficulty maintaining continence: No Do you need assistance with getting out of bed/getting out of a chair/moving?: No Do you have difficulty managing or taking your medications?: No  Folllow up appointments reviewed: PCP Follow-up appointment confirmed?: [facility] Follow-up appointment confirmed?: NA Do you need transportation to your follow-up appointment?: No Do you understand care options if your condition(s) worsen?: Yes-patient verbalized understanding    [place], [place] Direct Dial [phone]

## 2023-01-18 ENCOUNTER — Emergency Department (HOSPITAL_COMMUNITY)
Admission: EM | Admit: 2023-01-18 | Discharge: 2023-01-18 | Disposition: A | Discharge status: Home / Self Care | Payer: Self-pay | Attending: Emergency Medicine | Admitting: Emergency Medicine

## 2023-01-18 ENCOUNTER — Other Ambulatory Visit: Payer: Self-pay

## 2023-01-18 DIAGNOSIS — R21 Rash and other nonspecific skin eruption: Secondary | ICD-10-CM | POA: Insufficient documentation

## 2023-01-18 MED ORDER — PREDNISONE 20 MG PO TABS: 40.0000 mg | ORAL_TABLET | Freq: Every day | ORAL | 0 refills | Status: AC

## 2023-01-18 NOTE — ED Triage Notes (Addendum)
Pt arrived via POV from home. Pt c/o pruritic raised rash on bilat sides of abd that has been there for a month, with sweating.  AOx4

## 2023-01-18 NOTE — ED Provider Notes (Signed)
[facility] Provider Note   CSN: [id number] Arrival date & time: 01/18/23  [id number]     History  Chief Complaint  Patient presents with   Rash    Chad Spears is a 42 y.o. male.  42 year old male with pruritic, rash to bilateral flanks that is been ongoing for the past month.  Patient this is.  Acute issue, he has tried to apply topical ago without much improvement.  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  He does have an appointment set up with his primary care physician on 20 March.  He was evaluated here for the same complaint in the past but did not receive any medication for this rash.  He also reports he has been sweating over the past couple of weeks.  He has not taken any medication for improvement symptoms.  No fever, no abdominal pain, no nausea, no vomiting.  The history is provided by the patient.  Rash Quality: dryness and itchiness   Severity:  Moderate Associated symptoms: no fever        Home Medications Prior to Admission medications   Medication Sig Start Date End Date Taking? Authorizing Provider  predniSONE (DELTASONE) 20 MG tablet Take 2 tablets (40 mg total) by mouth daily for 5 days. 01/18/23 01/23/23 Yes Ernesto Lashway, PA-C  dicyclomine (BENTYL) 20 MG tablet Take 1 tablet (20 mg total) by mouth 2 (two) times daily for 5 days. 12/05/22 12/10/22  Carmin Muskrat, MD  meloxicam (MOBIC) 15 MG tablet Take 1 tablet (15 mg total) by mouth daily as needed for pain (pc). 10/30/22   Plotnikov, Evie Lacks, MD  ondansetron (ZOFRAN-ODT) 4 MG disintegrating tablet Take 1 tablet (4 mg total) by mouth every 8 (eight) hours as needed for nausea or vomiting. 01/04/23   Schutt, Grafton Folk, PA-C  phentermine (ADIPEX-P) 37.5 MG tablet Take 1 tablet (37.5 mg total) by mouth daily before breakfast. 10/30/22   Plotnikov, Evie Lacks, MD  sildenafil  (REVATIO) 20 MG tablet TAKE 1 TO 4 TABLETS BY MOUTH ONCE DAILY AS NEEDED 10/30/22   Plotnikov, Evie Lacks, MD  triamcinolone cream (KENALOG) 0.1 % Apply 1 Application topically 3 (three) times daily. 12/04/22   Plotnikov, Evie Lacks, MD  Vitamin D, Ergocalciferol, (DRISDOL) 1.25 MG (50000 UNIT) CAPS capsule TAKE 1 CAPSULE BY MOUTH ONCE EVERY MONTH 10/30/22   Plotnikov, Evie Lacks, MD      Allergies    Amitiza [lubiprostone] and Iohexol    Review of Systems   Review of Systems  Constitutional:  Negative for fever.  Skin:  Positive for rash.    Physical Exam Updated Vital Signs BP 128/84 (BP Location: Right Arm)   Pulse 93   Temp 98.2 F (36.8 C) (Oral)   Resp 18   SpO2 100%  Physical Exam Vitals and nursing note reviewed.  Constitutional:      Appearance: Normal appearance.  HENT:     Head: Atraumatic.  Eyes:     Pupils: Pupils are equal, round, and reactive to light.  Cardiovascular:     Rate and Rhythm: Normal rate.  Pulmonary:     Effort: Pulmonary effort is normal.  Abdominal:     General: Abdomen is flat.  Musculoskeletal:     Cervical back: Normal range of motion and neck supple.  Skin:    General: Skin is warm  and dry.     Findings: Rash present. Rash is not purpuric or vesicular.     Comments: Bilateral flanks appear to be covered in a dry, scaly looking rash.  No vesicles, no urticarial, no erythema noted.  Patches appear to be dry.  Neurological:     Mental Status: He is alert and oriented to person, place, and time.     ED Results / Procedures / Treatments   Labs (all labs ordered are listed, but only abnormal results are displayed) Labs Reviewed - No data to display  EKG None  Radiology No results found.  Procedures Procedures    Medications Ordered in ED Medications - No data to display  ED Course/ Medical Decision Making/ A&P                             Medical Decision Making   Patient presents to the ED for second visit due to a rash  to bilateral flanks, did not receive any medication previously for this rash.  Patient reports this sort of occurred after him staying in a hotel and using their towels and soaps that likely irritating his skin.  No medication changes systemic signs such as fever, abdominal pain.  He is afebrile here in the ED with a temperature of 98.2. There is no vesicular changes consistent with shingles. No erythema or streaking in the skin to suggest infection. We discussed prophylactic treatment with a short course of steroids to help with swelling and discomfort.  He does have an appointment scheduled with his primary care physician at the end of this month, we will seek reevaluation then.  Patient with stable vital signs hemodynamically stable for discharge.    Portions of this note were generated with [employer]. Dictation errors may occur despite best attempts at proofreading.   Final Clinical Impression(s) / ED Diagnoses Final diagnoses:  Rash    Rx / DC Orders ED Discharge Orders          Ordered    predniSONE (DELTASONE) 20 MG tablet  Daily        01/18/23 0937              Janeece Fitting, PA-C 01/18/23 0941    Wyvonnia Dusky, MD 01/18/23 1339

## 2023-01-18 NOTE — Discharge Instructions (Addendum)
I have provided medication to help suppress the rash.  You need to tablets daily for the next 5 days.  Please follow-up with your primary care physician at your earliest

## 2023-01-23 NOTE — Progress Notes (Signed)
This encounter was created in error - please disregard.

## 2023-01-28 ENCOUNTER — Other Ambulatory Visit: Payer: Self-pay | Admitting: Internal Medicine

## 2023-01-30 ENCOUNTER — Ambulatory Visit: Payer: Self-pay | Admitting: Internal Medicine

## 2023-02-06 ENCOUNTER — Ambulatory Visit (INDEPENDENT_AMBULATORY_CARE_PROVIDER_SITE_OTHER): Payer: Self-pay | Admitting: Internal Medicine

## 2023-02-06 ENCOUNTER — Encounter: Payer: Self-pay | Admitting: Internal Medicine

## 2023-02-06 VITALS — BP 94/70 | HR 94 | Temp 98.1°F | Ht 72.0 in

## 2023-02-06 DIAGNOSIS — R635 Abnormal weight gain: Secondary | ICD-10-CM

## 2023-02-06 DIAGNOSIS — E559 Vitamin D deficiency, unspecified: Secondary | ICD-10-CM

## 2023-02-06 DIAGNOSIS — R21 Rash and other nonspecific skin eruption: Secondary | ICD-10-CM

## 2023-02-06 MED ORDER — VITAMIN D (ERGOCALCIFEROL) 1.25 MG (50000 UNIT) PO CAPS: ORAL_CAPSULE | ORAL | 3 refills | Status: DC

## 2023-02-06 MED ORDER — LORATADINE 10 MG PO TABS: 10.0000 mg | ORAL_TABLET | Freq: Every day | ORAL | 3 refills | Status: AC

## 2023-02-06 MED ORDER — SILDENAFIL CITRATE 100 MG PO TABS: 100.0000 mg | ORAL_TABLET | Freq: Every day | ORAL | 5 refills | Status: DC | PRN

## 2023-02-06 MED ORDER — SILDENAFIL CITRATE 20 MG PO TABS: ORAL_TABLET | ORAL | 3 refills | Status: DC

## 2023-02-06 MED ORDER — CLOTRIMAZOLE-BETAMETHASONE 1-0.05 % EX CREA: 1.0000 | TOPICAL_CREAM | Freq: Two times a day (BID) | CUTANEOUS | 0 refills | Status: DC

## 2023-02-06 MED ORDER — METHYLPREDNISOLONE 4 MG PO TBPK: ORAL_TABLET | ORAL | 0 refills | Status: DC

## 2023-02-06 NOTE — Progress Notes (Signed)
Subjective:  Patient ID: Chad Spears, male    DOB: [birth year]  Age: 42 y.o. MRN: [id number]  CC: Nausea   HPI MELBURN LEIBFRIED presents for rash x 2-4 weeks F/u on obesity, vit D def Planning to work for [employer]  Outpatient Medications Prior to Visit  Medication Sig Dispense Refill   meloxicam (MOBIC) 15 MG tablet Take 1 tablet (15 mg total) by mouth daily as needed for pain (pc). 90 tablet 1   ondansetron (ZOFRAN-ODT) 4 MG disintegrating tablet Take 1 tablet (4 mg total) by mouth every 8 (eight) hours as needed for nausea or vomiting. 15 tablet 0   phentermine (ADIPEX-P) 37.5 MG tablet TAKE 1 TABLET BY MOUTH ONCE DAILY BEFORE BREAKFAST 90 tablet 0   sildenafil (REVATIO) 20 MG tablet TAKE 1 TO 4 TABLETS BY MOUTH ONCE DAILY AS NEEDED 80 tablet 3   triamcinolone cream (KENALOG) 0.1 % Apply 1 Application topically 3 (three) times daily. 80 g 3   Vitamin D, Ergocalciferol, (DRISDOL) 1.25 MG (50000 UNIT) CAPS capsule TAKE 1 CAPSULE BY MOUTH ONCE EVERY MONTH 3 capsule 3   dicyclomine (BENTYL) 20 MG tablet Take 1 tablet (20 mg total) by mouth 2 (two) times daily for 5 days. 10 tablet 0   No facility-administered medications prior to visit.    ROS: Review of Systems  Constitutional:  Negative for appetite change, fatigue and unexpected weight change.  HENT:  Negative for congestion, nosebleeds, sneezing, sore throat and trouble swallowing.   Eyes:  Negative for itching and visual disturbance.  Respiratory:  Negative for cough.   Cardiovascular:  Negative for chest pain, palpitations and leg swelling.  Gastrointestinal:  Negative for abdominal distention, blood in stool, diarrhea and nausea.  Genitourinary:  Negative for frequency and hematuria.  Musculoskeletal:  Negative for back pain, gait problem, joint swelling and neck pain.  Skin:  Positive for rash.  Neurological:  Negative for dizziness, tremors, speech difficulty and weakness.  Psychiatric/Behavioral:  Negative for agitation,  dysphoric mood and sleep disturbance. The patient is not nervous/anxious.     Objective:  BP 94/70 (BP Location: Left Arm, Patient Position: Sitting, Cuff Size: Large)   Pulse 94   Temp 98.1 F (36.7 C) (Oral)   Ht 6' (1.829 m)   SpO2 99%   BMI 30.80 kg/m   BP Readings from Last 3 Encounters:  02/06/23 94/70  01/18/23 128/84  01/04/23 121/78    Wt Readings from Last 3 Encounters:  01/04/23 227 lb 1.2 oz (103 kg)  12/05/22 227 lb (103 kg)  12/04/22 237 lb (107.5 kg)    Physical Exam Constitutional:      General: He is not in acute distress.    Appearance: He is well-developed. He is obese.     Comments: NAD  Eyes:     Conjunctiva/sclera: Conjunctivae normal.     Pupils: Pupils are equal, round, and reactive to light.  Neck:     Thyroid: No thyromegaly.     Vascular: No JVD.  Cardiovascular:     Rate and Rhythm: Normal rate and regular rhythm.     Heart sounds: Normal heart sounds. No murmur heard.    No friction rub. No gallop.  Pulmonary:     Effort: Pulmonary effort is normal. No respiratory distress.     Breath sounds: Normal breath sounds. No wheezing or rales.  Chest:     Chest wall: No tenderness.  Abdominal:     General: Bowel sounds are normal. There  is no distension.     Palpations: Abdomen is soft. There is no mass.     Tenderness: There is no abdominal tenderness. There is no guarding or rebound.  Musculoskeletal:        General: No tenderness. Normal range of motion.     Cervical back: Normal range of motion.  Lymphadenopathy:     Cervical: No cervical adenopathy.  Skin:    General: Skin is warm and dry.     Findings: No rash.  Neurological:     Mental Status: He is alert and oriented to person, place, and time.     Cranial Nerves: No cranial nerve deficit.     Motor: No abnormal muscle tone.     Coordination: Coordination normal.     Gait: Gait normal.     Deep Tendon Reflexes: Reflexes are normal and symmetric.  Psychiatric:         Behavior: Behavior normal.        Thought Content: Thought content normal.        Judgment: Judgment normal.     Lab Results  Component Value Date   WBC 7.5 01/04/2023   HGB 14.1 01/04/2023   HCT 44.2 01/04/2023   PLT 327 01/04/2023   GLUCOSE 100 (H) 01/04/2023   CHOL 177 04/25/2021   TRIG 41.0 04/25/2021   HDL 51.20 04/25/2021   LDLCALC 117 (H) 04/25/2021   ALT 24 01/04/2023   AST 23 01/04/2023   NA 137 01/04/2023   K 4.2 01/04/2023   CL 103 01/04/2023   CREATININE 0.90 01/04/2023   BUN 14 01/04/2023   CO2 27 01/04/2023   TSH 1.46 04/25/2021   PSA 0.55 03/21/2020   INR 1.08 08/23/2010    No results found.  Assessment & Plan:   Problem List Items Addressed This Visit       Musculoskeletal and Integument   Rash and nonspecific skin eruption - Primary    Rash on the trunk, B axillas Lotrisone cream Medrol pack         Other   Weight gain    Cont on good diet and use Phentermine. Potential benefits of a long term adipex use as well as potential risks  and complications were explained to the patient and were aknowledged.      Vitamin D deficiency    On Vit D         Meds ordered this encounter  Medications   loratadine (CLARITIN) 10 MG tablet    Sig: Take 1 tablet (10 mg total) by mouth daily.    Dispense:  90 tablet    Refill:  3   methylPREDNISolone (MEDROL DOSEPAK) 4 MG TBPK tablet    Sig: As directed    Dispense:  21 tablet    Refill:  0   clotrimazole-betamethasone (LOTRISONE) cream    Sig: Apply 1 Application topically 2 (two) times daily.    Dispense:  135 g    Refill:  0      Follow-up: Return in about 3 months (around 05/09/2023) for a follow-up visit.  Walker Kehr, MD

## 2023-02-06 NOTE — Assessment & Plan Note (Signed)
Cont on good diet and use Phentermine. Potential benefits of a long term adipex use as well as potential risks  and complications were explained to the patient and were aknowledged.

## 2023-02-06 NOTE — Assessment & Plan Note (Signed)
Rash on the trunk, B axillas Lotrisone cream Medrol pack

## 2023-02-06 NOTE — Assessment & Plan Note (Signed)
On Vit D 

## 2023-02-18 ENCOUNTER — Other Ambulatory Visit: Payer: Self-pay | Admitting: Internal Medicine

## 2023-02-24 ENCOUNTER — Other Ambulatory Visit: Payer: Self-pay | Admitting: Internal Medicine

## 2023-02-27 ENCOUNTER — Ambulatory Visit: Payer: Self-pay | Admitting: Internal Medicine

## 2023-03-10 ENCOUNTER — Encounter: Payer: Self-pay | Admitting: Internal Medicine

## 2023-03-10 ENCOUNTER — Ambulatory Visit (INDEPENDENT_AMBULATORY_CARE_PROVIDER_SITE_OTHER): Payer: Self-pay | Admitting: Internal Medicine

## 2023-03-10 VITALS — BP 110/84 | HR 74 | Temp 98.2°F | Ht 72.0 in | Wt 230.0 lb

## 2023-03-10 DIAGNOSIS — N528 Other male erectile dysfunction: Secondary | ICD-10-CM

## 2023-03-10 DIAGNOSIS — R21 Rash and other nonspecific skin eruption: Secondary | ICD-10-CM

## 2023-03-10 DIAGNOSIS — L309 Dermatitis, unspecified: Secondary | ICD-10-CM

## 2023-03-10 DIAGNOSIS — R300 Dysuria: Secondary | ICD-10-CM

## 2023-03-10 LAB — URINALYSIS
Bilirubin Urine: NEGATIVE
Hgb urine dipstick: NEGATIVE
Ketones, ur: NEGATIVE
Leukocytes,Ua: NEGATIVE
Nitrite: NEGATIVE
Specific Gravity, Urine: 1.02 (ref 1.000–1.030)
Total Protein, Urine: NEGATIVE
Urine Glucose: NEGATIVE
Urobilinogen, UA: 0.2 (ref 0.0–1.0)
pH: 7 (ref 5.0–8.0)

## 2023-03-10 MED ORDER — DOXYCYCLINE HYCLATE 100 MG PO TABS: 100.0000 mg | ORAL_TABLET | Freq: Two times a day (BID) | ORAL | 1 refills | Status: DC

## 2023-03-10 MED ORDER — TRIAMCINOLONE ACETONIDE 0.1 % EX CREA: 1.0000 | TOPICAL_CREAM | Freq: Three times a day (TID) | CUTANEOUS | 3 refills | Status: DC

## 2023-03-10 NOTE — Assessment & Plan Note (Signed)
Check UA Doxy po

## 2023-03-10 NOTE — Assessment & Plan Note (Signed)
Cont on Revatio prn 

## 2023-03-10 NOTE — Assessment & Plan Note (Signed)
Eczema Triamcinolone cream tid  02/2023 ?acnae. Start Doxy po. Dermatology ref

## 2023-03-10 NOTE — Progress Notes (Signed)
Subjective:  Patient ID: Chad Spears, male    DOB: April 25, 1981  Age: 42 y.o. MRN: [id number]  CC: Rash (Rash like bumps on back and arms)   HPI Chad Spears presents for rash on chest, back, arms C/o dysuria x 2 weeks  Outpatient Medications Prior to Visit  Medication Sig Dispense Refill   clotrimazole-betamethasone (LOTRISONE) cream APPLY  CREAM TOPICALLY TWICE DAILY 135 g 0   loratadine (CLARITIN) 10 MG tablet Take 1 tablet (10 mg total) by mouth daily. 90 tablet 3   meloxicam (MOBIC) 15 MG tablet Take 1 tablet (15 mg total) by mouth daily as needed for pain (pc). 90 tablet 1   methylPREDNISolone (MEDROL DOSEPAK) 4 MG TBPK tablet As directed 21 tablet 0   ondansetron (ZOFRAN-ODT) 4 MG disintegrating tablet Take 1 tablet (4 mg total) by mouth every 8 (eight) hours as needed for nausea or vomiting. 15 tablet 0   phentermine (ADIPEX-P) 37.5 MG tablet TAKE 1 TABLET BY MOUTH ONCE DAILY BEFORE BREAKFAST 90 tablet 0   sildenafil (REVATIO) 20 MG tablet TAKE 1 TO 4 TABLETS BY MOUTH ONCE DAILY AS NEEDED 80 tablet 0   sildenafil (VIAGRA) 100 MG tablet Take 1 tablet (100 mg total) by mouth daily as needed for erectile dysfunction. 12 tablet 5   Vitamin D, Ergocalciferol, (DRISDOL) 1.25 MG (50000 UNIT) CAPS capsule TAKE 1 CAPSULE BY MOUTH ONCE EVERY MONTH 3 capsule 3   triamcinolone cream (KENALOG) 0.1 % Apply 1 Application topically 3 (three) times daily. 80 g 3   No facility-administered medications prior to visit.    ROS: Review of Systems  Constitutional:  Negative for appetite change, fatigue and unexpected weight change.  HENT:  Negative for congestion, nosebleeds, sneezing, sore throat and trouble swallowing.   Eyes:  Negative for itching and visual disturbance.  Respiratory:  Negative for cough.   Cardiovascular:  Negative for chest pain, palpitations and leg swelling.  Gastrointestinal:  Negative for abdominal distention, blood in stool, diarrhea and nausea.  Genitourinary:  Positive  for dysuria and urgency. Negative for frequency and hematuria.  Musculoskeletal:  Negative for back pain, gait problem, joint swelling and neck pain.  Skin:  Positive for rash.  Neurological:  Negative for dizziness, tremors, speech difficulty and weakness.  Psychiatric/Behavioral:  Negative for agitation, dysphoric mood and sleep disturbance. The patient is not nervous/anxious.     Objective:  BP 110/84 (BP Location: Right Arm, Patient Position: Sitting, Cuff Size: Large)   Pulse 74   Temp 98.2 F (36.8 C) (Oral)   Ht 6' (1.829 m)   Wt 230 lb (104.3 kg)   SpO2 97%   BMI 31.19 kg/m   BP Readings from Last 3 Encounters:  03/10/23 110/84  02/06/23 94/70  01/18/23 128/84    Wt Readings from Last 3 Encounters:  03/10/23 230 lb (104.3 kg)  01/04/23 227 lb 1.2 oz (103 kg)  12/05/22 227 lb (103 kg)    Physical Exam Constitutional:      General: He is not in acute distress.    Appearance: He is well-developed. He is obese.     Comments: NAD  Eyes:     Conjunctiva/sclera: Conjunctivae normal.     Pupils: Pupils are equal, round, and reactive to light.  Neck:     Thyroid: No thyromegaly.     Vascular: No JVD.  Cardiovascular:     Rate and Rhythm: Normal rate and regular rhythm.     Heart sounds: Normal heart sounds. No  murmur heard.    No friction rub. No gallop.  Pulmonary:     Effort: Pulmonary effort is normal. No respiratory distress.     Breath sounds: Normal breath sounds. No wheezing or rales.  Chest:     Chest wall: No tenderness.  Abdominal:     General: Bowel sounds are normal. There is no distension.     Palpations: Abdomen is soft. There is no mass.     Tenderness: There is no abdominal tenderness. There is no guarding or rebound.  Musculoskeletal:        General: No tenderness. Normal range of motion.     Cervical back: Normal range of motion.  Lymphadenopathy:     Cervical: No cervical adenopathy.  Skin:    General: Skin is warm and dry.     Findings:  No rash.  Neurological:     Mental Status: He is alert and oriented to person, place, and time.     Cranial Nerves: No cranial nerve deficit.     Motor: No abnormal muscle tone.     Coordination: Coordination normal.     Gait: Gait normal.     Deep Tendon Reflexes: Reflexes are normal and symmetric.  Psychiatric:        Behavior: Behavior normal.        Thought Content: Thought content normal.        Judgment: Judgment normal.   1-2 mm acnae lesions on chest today Dry skin on palms  Lab Results  Component Value Date   WBC 7.5 01/04/2023   HGB 14.1 01/04/2023   HCT 44.2 01/04/2023   PLT 327 01/04/2023   GLUCOSE 100 (H) 01/04/2023   CHOL 177 04/25/2021   TRIG 41.0 04/25/2021   HDL 51.20 04/25/2021   LDLCALC 117 (H) 04/25/2021   ALT 24 01/04/2023   AST 23 01/04/2023   NA 137 01/04/2023   K 4.2 01/04/2023   CL 103 01/04/2023   CREATININE 0.90 01/04/2023   BUN 14 01/04/2023   CO2 27 01/04/2023   TSH 1.46 04/25/2021   PSA 0.55 03/21/2020   INR 1.08 08/23/2010    No results found.  Assessment & Plan:   Problem List Items Addressed This Visit     Erectile dysfunction    Cont on Revatio prn      Dysuria    Check UA Doxy po      Relevant Orders   Urinalysis   Rash and nonspecific skin eruption - Primary    Eczema Triamcinolone cream tid  02/2023 ?acnae. Start Doxy po. Dermatology ref      Eczema of hand    B hands Use gloves Kenalog Rx         Meds ordered this encounter  Medications   doxycycline (VIBRA-TABS) 100 MG tablet    Sig: Take 1 tablet (100 mg total) by mouth 2 (two) times daily.    Dispense:  60 tablet    Refill:  1   triamcinolone cream (KENALOG) 0.1 %    Sig: Apply 1 Application topically 3 (three) times daily.    Dispense:  80 g    Refill:  3      Follow-up: Return in about 6 weeks (around 04/21/2023) for a follow-up visit.  Sonda Primes, MD

## 2023-03-10 NOTE — Assessment & Plan Note (Signed)
B hands Use gloves Kenalog Rx

## 2023-03-20 ENCOUNTER — Encounter: Payer: Self-pay | Admitting: Internal Medicine

## 2023-03-20 ENCOUNTER — Ambulatory Visit (INDEPENDENT_AMBULATORY_CARE_PROVIDER_SITE_OTHER): Payer: Self-pay | Admitting: Internal Medicine

## 2023-03-20 VITALS — BP 114/82 | HR 86 | Temp 98.2°F | Ht 72.0 in | Wt 230.0 lb

## 2023-03-20 DIAGNOSIS — E559 Vitamin D deficiency, unspecified: Secondary | ICD-10-CM

## 2023-03-20 DIAGNOSIS — Z0000 Encounter for general adult medical examination without abnormal findings: Secondary | ICD-10-CM

## 2023-03-20 DIAGNOSIS — E669 Obesity, unspecified: Secondary | ICD-10-CM

## 2023-03-20 DIAGNOSIS — L309 Dermatitis, unspecified: Secondary | ICD-10-CM

## 2023-03-20 DIAGNOSIS — Z125 Encounter for screening for malignant neoplasm of prostate: Secondary | ICD-10-CM

## 2023-03-20 DIAGNOSIS — Z13220 Encounter for screening for lipoid disorders: Secondary | ICD-10-CM

## 2023-03-20 DIAGNOSIS — R21 Rash and other nonspecific skin eruption: Secondary | ICD-10-CM

## 2023-03-20 LAB — CBC WITH DIFFERENTIAL/PLATELET
Basophils Absolute: 0.1 10*3/uL (ref 0.0–0.1)
Basophils Relative: 0.7 % (ref 0.0–3.0)
Eosinophils Absolute: 0.1 10*3/uL (ref 0.0–0.7)
Eosinophils Relative: 1.3 % (ref 0.0–5.0)
HCT: 41 % (ref 39.0–52.0)
Hemoglobin: 13.3 g/dL (ref 13.0–17.0)
Lymphocytes Relative: 30.6 % (ref 12.0–46.0)
Lymphs Abs: 2.8 10*3/uL (ref 0.7–4.0)
MCHC: 32.4 g/dL (ref 30.0–36.0)
MCV: 77.8 fl — ABNORMAL LOW (ref 78.0–100.0)
Monocytes Absolute: 0.9 10*3/uL (ref 0.1–1.0)
Monocytes Relative: 10.3 % (ref 3.0–12.0)
Neutro Abs: 5.2 10*3/uL (ref 1.4–7.7)
Neutrophils Relative %: 57.1 % (ref 43.0–77.0)
Platelets: 350 10*3/uL (ref 150.0–400.0)
RBC: 5.26 Mil/uL (ref 4.22–5.81)
RDW: 15.3 % (ref 11.5–15.5)
WBC: 9.2 10*3/uL (ref 4.0–10.5)

## 2023-03-20 LAB — COMPREHENSIVE METABOLIC PANEL
ALT: 18 U/L (ref 0–53)
AST: 20 U/L (ref 0–37)
Albumin: 4.1 g/dL (ref 3.5–5.2)
Alkaline Phosphatase: 82 U/L (ref 39–117)
BUN: 11 mg/dL (ref 6–23)
CO2: 31 mEq/L (ref 19–32)
Calcium: 9.5 mg/dL (ref 8.4–10.5)
Chloride: 105 mEq/L (ref 96–112)
Creatinine, Ser: 1.12 mg/dL (ref 0.40–1.50)
GFR: 80.01 mL/min (ref >60.00)
Glucose, Bld: 85 mg/dL (ref 70–99)
Potassium: 4.3 mEq/L (ref 3.5–5.1)
Sodium: 143 mEq/L (ref 135–145)
Total Bilirubin: 0.4 mg/dL (ref 0.2–1.2)
Total Protein: 6.5 g/dL (ref 6.0–8.3)

## 2023-03-20 LAB — URINALYSIS
Bilirubin Urine: NEGATIVE
Hgb urine dipstick: NEGATIVE
Ketones, ur: NEGATIVE
Leukocytes,Ua: NEGATIVE
Nitrite: NEGATIVE
Specific Gravity, Urine: 1.01 (ref 1.000–1.030)
Total Protein, Urine: NEGATIVE
Urine Glucose: NEGATIVE
Urobilinogen, UA: 0.2 (ref 0.0–1.0)
pH: 6.5 (ref 5.0–8.0)

## 2023-03-20 LAB — TSH: TSH: 2.25 u[IU]/mL (ref 0.35–5.50)

## 2023-03-20 LAB — PSA: PSA: 0.46 ng/mL (ref 0.10–4.00)

## 2023-03-20 LAB — LIPID PANEL
Cholesterol: 158 mg/dL (ref 0–200)
HDL: 56.3 mg/dL (ref >39.00)
LDL Cholesterol: 91 mg/dL (ref 0–99)
NonHDL: 101.66
Total CHOL/HDL Ratio: 3
Triglycerides: 52 mg/dL (ref 0.0–149.0)
VLDL: 10.4 mg/dL (ref 0.0–40.0)

## 2023-03-20 LAB — VITAMIN D 25 HYDROXY (VIT D DEFICIENCY, FRACTURES): VITD: 38.28 ng/mL (ref 30.00–100.00)

## 2023-03-20 MED ORDER — PHENTERMINE HCL 37.5 MG PO TABS: 37.5000 mg | ORAL_TABLET | Freq: Every day | ORAL | 0 refills | Status: DC

## 2023-03-20 MED ORDER — CLOTRIMAZOLE-BETAMETHASONE 1-0.05 % EX CREA: 1.0000 | TOPICAL_CREAM | Freq: Two times a day (BID) | CUTANEOUS | 1 refills | Status: DC

## 2023-03-20 MED ORDER — VITAMIN D (ERGOCALCIFEROL) 1.25 MG (50000 UNIT) PO CAPS: ORAL_CAPSULE | ORAL | 3 refills | Status: DC

## 2023-03-20 MED ORDER — KETOCONAZOLE 200 MG PO TABS: 200.0000 mg | ORAL_TABLET | Freq: Two times a day (BID) | ORAL | 0 refills | Status: DC

## 2023-03-20 MED ORDER — CYCLOBENZAPRINE HCL 5 MG PO TABS: 5.0000 mg | ORAL_TABLET | Freq: Three times a day (TID) | ORAL | 1 refills | Status: DC | PRN

## 2023-03-20 MED ORDER — SILDENAFIL CITRATE 20 MG PO TABS: ORAL_TABLET | ORAL | 0 refills | Status: DC

## 2023-03-20 NOTE — Assessment & Plan Note (Signed)
Dermatology ref Trial of Ketoconazole, Lotrisone cream

## 2023-03-20 NOTE — Assessment & Plan Note (Signed)
On Phentermine  Potential benefits of a long term adipex use as well as potential risks  and complications were explained to the patient and were aknowledged.

## 2023-03-20 NOTE — Assessment & Plan Note (Signed)

## 2023-03-20 NOTE — Assessment & Plan Note (Signed)
On Vit D Rx 

## 2023-03-20 NOTE — Assessment & Plan Note (Signed)
?  etiology Dermatology ref Trial of Ketoconazole, Lotrisone cream

## 2023-03-20 NOTE — Progress Notes (Signed)
Subjective:  Patient ID: Chad Spears, male    DOB: 06-12-1981  Age: 42 y.o. MRN: [id number]  CC: No chief complaint on file.   HPI Chad Spears presents for a well exam  Outpatient Medications Prior to Visit  Medication Sig Dispense Refill   loratadine (CLARITIN) 10 MG tablet Take 1 tablet (10 mg total) by mouth daily. 90 tablet 3   meloxicam (MOBIC) 15 MG tablet Take 1 tablet (15 mg total) by mouth daily as needed for pain (pc). 90 tablet 1   ondansetron (ZOFRAN-ODT) 4 MG disintegrating tablet Take 1 tablet (4 mg total) by mouth every 8 (eight) hours as needed for nausea or vomiting. 15 tablet 0   sildenafil (VIAGRA) 100 MG tablet Take 1 tablet (100 mg total) by mouth daily as needed for erectile dysfunction. 12 tablet 5   triamcinolone cream (KENALOG) 0.1 % Apply 1 Application topically 3 (three) times daily. 80 g 3   clotrimazole-betamethasone (LOTRISONE) cream APPLY  CREAM TOPICALLY TWICE DAILY 135 g 0   doxycycline (VIBRA-TABS) 100 MG tablet Take 1 tablet (100 mg total) by mouth 2 (two) times daily. 60 tablet 1   methylPREDNISolone (MEDROL DOSEPAK) 4 MG TBPK tablet As directed 21 tablet 0   phentermine (ADIPEX-P) 37.5 MG tablet TAKE 1 TABLET BY MOUTH ONCE DAILY BEFORE BREAKFAST 90 tablet 0   sildenafil (REVATIO) 20 MG tablet TAKE 1 TO 4 TABLETS BY MOUTH ONCE DAILY AS NEEDED 80 tablet 0   Vitamin D, Ergocalciferol, (DRISDOL) 1.25 MG (50000 UNIT) CAPS capsule TAKE 1 CAPSULE BY MOUTH ONCE EVERY MONTH 3 capsule 3   No facility-administered medications prior to visit.    ROS: Review of Systems  Constitutional:  Negative for appetite change, fatigue and unexpected weight change.  HENT:  Negative for congestion, nosebleeds, sneezing, sore throat and trouble swallowing.   Eyes:  Negative for itching and visual disturbance.  Respiratory:  Negative for cough.   Cardiovascular:  Negative for chest pain, palpitations and leg swelling.  Gastrointestinal:  Negative for abdominal distention,  blood in stool, diarrhea and nausea.  Genitourinary:  Negative for frequency and hematuria.  Musculoskeletal:  Positive for back pain. Negative for gait problem, joint swelling and neck pain.  Skin:  Positive for color change and rash.  Neurological:  Negative for dizziness, tremors, speech difficulty and weakness.  Psychiatric/Behavioral:  Negative for agitation, dysphoric mood and sleep disturbance. The patient is not nervous/anxious.     Objective:  BP 114/82 (BP Location: Left Arm, Patient Position: Sitting, Cuff Size: Normal)   Pulse 86   Temp 98.2 F (36.8 C) (Oral)   Ht 6' (1.829 m)   Wt 230 lb (104.3 kg)   SpO2 97%   BMI 31.19 kg/m   BP Readings from Last 3 Encounters:  03/20/23 114/82  03/10/23 110/84  02/06/23 94/70    Wt Readings from Last 3 Encounters:  03/20/23 230 lb (104.3 kg)  03/10/23 230 lb (104.3 kg)  01/04/23 227 lb 1.2 oz (103 kg)    Physical Exam Constitutional:      General: He is not in acute distress.    Appearance: He is well-developed. He is obese.     Comments: NAD  Eyes:     Conjunctiva/sclera: Conjunctivae normal.     Pupils: Pupils are equal, round, and reactive to light.  Neck:     Thyroid: No thyromegaly.     Vascular: No JVD.  Cardiovascular:     Rate and Rhythm: Normal rate and  regular rhythm.     Heart sounds: Normal heart sounds. No murmur heard.    No friction rub. No gallop.  Pulmonary:     Effort: Pulmonary effort is normal. No respiratory distress.     Breath sounds: Normal breath sounds. No wheezing or rales.  Chest:     Chest wall: No tenderness.  Abdominal:     General: Bowel sounds are normal. There is no distension.     Palpations: Abdomen is soft. There is no mass.     Tenderness: There is no abdominal tenderness. There is no guarding or rebound.  Musculoskeletal:        General: No tenderness. Normal range of motion.     Cervical back: Normal range of motion.     Right lower leg: No edema.     Left lower leg:  No edema.  Lymphadenopathy:     Cervical: No cervical adenopathy.  Skin:    General: Skin is warm and dry.     Findings: Erythema and rash present.  Neurological:     Mental Status: He is alert and oriented to person, place, and time.     Cranial Nerves: No cranial nerve deficit.     Motor: No weakness or abnormal muscle tone.     Coordination: Coordination normal.     Gait: Gait normal.     Deep Tendon Reflexes: Reflexes are normal and symmetric.  Psychiatric:        Behavior: Behavior normal.        Thought Content: Thought content normal.        Judgment: Judgment normal.   Small papules on the trunk 1-2 mm Scaly rash in B axillae and on the R palm  Lab Results  Component Value Date   WBC 7.5 01/04/2023   HGB 14.1 01/04/2023   HCT 44.2 01/04/2023   PLT 327 01/04/2023   GLUCOSE 100 (H) 01/04/2023   CHOL 177 04/25/2021   TRIG 41.0 04/25/2021   HDL 51.20 04/25/2021   LDLCALC 117 (H) 04/25/2021   ALT 24 01/04/2023   AST 23 01/04/2023   NA 137 01/04/2023   K 4.2 01/04/2023   CL 103 01/04/2023   CREATININE 0.90 01/04/2023   BUN 14 01/04/2023   CO2 27 01/04/2023   TSH 1.46 04/25/2021   PSA 0.55 03/21/2020   INR 1.08 08/23/2010    No results found.  Assessment & Plan:   Problem List Items Addressed This Visit     Well adult exam    We discussed age appropriate health related issues, including available/recomended screening tests and vaccinations. Labs were ordered to be later reviewed . All questions were answered. We discussed one or more of the following - seat belt use, use of sunscreen/sun exposure exercise, safe sex, fall risk reduction, second hand smoke exposure, firearm use and storage, seat belt use, a need for adhering to healthy diet and exercise. Labs were ordered. All questions were answered.      Relevant Orders   Ambulatory referral to Dermatology   TSH   Urinalysis   CBC with Differential/Platelet   Lipid panel   Comprehensive metabolic panel    PSA   Obesity (BMI 30.0-34.9)    On Phentermine  Potential benefits of a long term adipex use as well as potential risks  and complications were explained to the patient and were aknowledged.      Vitamin D deficiency    On Vit D Rx      Relevant Orders  VITAMIN D 25 Hydroxy (Vit-D Deficiency, Fractures)   Rash and nonspecific skin eruption - Primary    ?etiology Dermatology ref Trial of Ketoconazole, Lotrisone cream      Eczema of hand    Dermatology ref Trial of Ketoconazole, Lotrisone cream         Meds ordered this encounter  Medications   ketoconazole (NIZORAL) 200 MG tablet    Sig: Take 1 tablet (200 mg total) by mouth 2 (two) times daily.    Dispense:  28 tablet    Refill:  0   DISCONTD: clotrimazole-betamethasone (LOTRISONE) cream    Sig: Apply 1 Application topically 2 (two) times daily.    Dispense:  90 g    Refill:  1   sildenafil (REVATIO) 20 MG tablet    Sig: TAKE 1 TO 4 TABLETS BY MOUTH ONCE DAILY AS NEEDED    Dispense:  80 tablet    Refill:  0   Vitamin D, Ergocalciferol, (DRISDOL) 1.25 MG (50000 UNIT) CAPS capsule    Sig: TAKE 1 CAPSULE BY MOUTH ONCE EVERY MONTH    Dispense:  3 capsule    Refill:  3   cyclobenzaprine (FLEXERIL) 5 MG tablet    Sig: Take 1 tablet (5 mg total) by mouth 3 (three) times daily as needed. for muscle spams    Dispense:  30 tablet    Refill:  1   phentermine (ADIPEX-P) 37.5 MG tablet    Sig: Take 1 tablet (37.5 mg total) by mouth daily before breakfast.    Dispense:  90 tablet    Refill:  0    Please fill in May 2024   clotrimazole-betamethasone (LOTRISONE) cream    Sig: Apply 1 Application topically 2 (two) times daily.    Dispense:  90 g    Refill:  1      Follow-up: Return in about 3 months (around 06/20/2023) for a follow-up visit.  Sonda Primes, MD

## 2023-03-29 ENCOUNTER — Other Ambulatory Visit: Payer: Self-pay | Admitting: Internal Medicine

## 2023-04-20 ENCOUNTER — Other Ambulatory Visit: Payer: Self-pay | Admitting: Internal Medicine

## 2023-04-24 ENCOUNTER — Ambulatory Visit: Payer: Self-pay | Admitting: Internal Medicine

## 2023-04-28 ENCOUNTER — Other Ambulatory Visit: Payer: Self-pay | Admitting: Internal Medicine

## 2023-05-08 ENCOUNTER — Ambulatory Visit: Payer: Self-pay | Admitting: Internal Medicine

## 2023-05-16 ENCOUNTER — Other Ambulatory Visit: Payer: Self-pay | Admitting: Internal Medicine

## 2023-05-17 ENCOUNTER — Emergency Department (HOSPITAL_COMMUNITY)
Admission: EM | Admit: 2023-05-17 | Discharge: 2023-05-17 | Disposition: A | Discharge status: Home / Self Care | Payer: Self-pay | Attending: Emergency Medicine | Admitting: Emergency Medicine

## 2023-05-17 ENCOUNTER — Other Ambulatory Visit: Payer: Self-pay

## 2023-05-17 ENCOUNTER — Emergency Department (HOSPITAL_COMMUNITY): Payer: Self-pay

## 2023-05-17 DIAGNOSIS — R112 Nausea with vomiting, unspecified: Secondary | ICD-10-CM | POA: Insufficient documentation

## 2023-05-17 DIAGNOSIS — M542 Cervicalgia: Secondary | ICD-10-CM | POA: Insufficient documentation

## 2023-05-17 DIAGNOSIS — R109 Unspecified abdominal pain: Secondary | ICD-10-CM | POA: Insufficient documentation

## 2023-05-17 LAB — COMPREHENSIVE METABOLIC PANEL
ALT: 22 U/L (ref 0–44)
AST: 20 U/L (ref 15–41)
Albumin: 3.6 g/dL (ref 3.5–5.0)
Alkaline Phosphatase: 79 U/L (ref 38–126)
Anion gap: 8 (ref 5–15)
BUN: 13 mg/dL (ref 6–20)
CO2: 23 mmol/L (ref 22–32)
Calcium: 8.5 mg/dL — ABNORMAL LOW (ref 8.9–10.3)
Chloride: 108 mmol/L (ref 98–111)
Creatinine, Ser: 0.78 mg/dL (ref 0.61–1.24)
GFR, Estimated: >60 mL/min (ref >60)
Glucose, Bld: 124 mg/dL — ABNORMAL HIGH (ref 70–99)
Potassium: 3.5 mmol/L (ref 3.5–5.1)
Sodium: 139 mmol/L (ref 135–145)
Total Bilirubin: 0.3 mg/dL (ref 0.3–1.2)
Total Protein: 6.3 g/dL — ABNORMAL LOW (ref 6.5–8.1)

## 2023-05-17 LAB — CBC
HCT: 41.4 % (ref 39.0–52.0)
Hemoglobin: 13.1 g/dL (ref 13.0–17.0)
MCH: 24.8 pg — ABNORMAL LOW (ref 26.0–34.0)
MCHC: 31.6 g/dL (ref 30.0–36.0)
MCV: 78.3 fL — ABNORMAL LOW (ref 80.0–100.0)
Platelets: 359 10*3/uL (ref 150–400)
RBC: 5.29 MIL/uL (ref 4.22–5.81)
RDW: 14.6 % (ref 11.5–15.5)
WBC: 7.6 10*3/uL (ref 4.0–10.5)
nRBC: 0 % (ref 0.0–0.2)

## 2023-05-17 LAB — URINALYSIS, ROUTINE W REFLEX MICROSCOPIC
Bilirubin Urine: NEGATIVE
Glucose, UA: NEGATIVE mg/dL
Hgb urine dipstick: NEGATIVE
Ketones, ur: NEGATIVE mg/dL
Leukocytes,Ua: NEGATIVE
Nitrite: NEGATIVE
Protein, ur: NEGATIVE mg/dL
Specific Gravity, Urine: 1.032 — ABNORMAL HIGH (ref 1.005–1.030)
pH: 5 (ref 5.0–8.0)

## 2023-05-17 LAB — MAGNESIUM: Magnesium: 1.9 mg/dL (ref 1.7–2.4)

## 2023-05-17 LAB — LIPASE, BLOOD: Lipase: 30 U/L (ref 11–51)

## 2023-05-17 MED ORDER — METHOCARBAMOL 500 MG PO TABS
1000.0000 mg | ORAL_TABLET | Freq: Once | ORAL | Status: AC
  Administered 2023-05-17: 1000 mg via ORAL
  Filled 2023-05-17: qty 2

## 2023-05-17 MED ORDER — KETOROLAC TROMETHAMINE 15 MG/ML IJ SOLN
15.0000 mg | Freq: Once | INTRAMUSCULAR | Status: AC
  Administered 2023-05-17: 15 mg via INTRAVENOUS
  Filled 2023-05-17: qty 1

## 2023-05-17 MED ORDER — LACTATED RINGERS IV BOLUS
1000.0000 mL | Freq: Once | INTRAVENOUS | Status: AC
  Administered 2023-05-17: 1000 mL via INTRAVENOUS

## 2023-05-17 MED ORDER — METHOCARBAMOL 500 MG PO TABS: 500.0000 mg | ORAL_TABLET | Freq: Three times a day (TID) | ORAL | 0 refills | Status: DC | PRN

## 2023-05-17 MED ORDER — METOCLOPRAMIDE HCL 5 MG/ML IJ SOLN
10.0000 mg | Freq: Once | INTRAMUSCULAR | Status: AC
  Administered 2023-05-17: 10 mg via INTRAVENOUS
  Filled 2023-05-17: qty 2

## 2023-05-17 NOTE — ED Provider Triage Note (Signed)
Emergency Medicine Provider Triage Evaluation Note  Chad Spears , a 42 y.o. male  was evaluated in triage.  Pt complains of neck pain and abdominal pain.  Neck pain began 2 to 3 weeks ago.  Described as a cramping sensation in the paraspinal muscles bilaterally.  No neck stiffness or headaches.  Abdominal pain began 2 days ago.  It was intermittent.  He had a few episodes of emesis this morning.  Currently does not have any nausea or abdominal pain.  No vision changes.  No numbness, weakness or tingling.  Review of Systems  Positive: As above Negative: As above  Physical Exam  BP 128/84   Pulse 80   Temp 98.3 F (36.8 C) (Oral)   Resp 16   SpO2 96%  Gen:   Awake, no distress   Resp:  Normal effort  MSK:   Moves extremities without difficulty  Other:  Full rotational, flexion and extension range of motion of the neck  Medical Decision Making  Medically screening exam initiated at 12:34 PM.  Appropriate orders placed.  Chad Spears was informed that the remainder of the evaluation will be completed by another provider, this initial triage assessment does not replace that evaluation, and the importance of remaining in the ED until their evaluation is complete.  Workup initiated   Chad Spears, Cordelia Poche 05/17/23 1235

## 2023-05-17 NOTE — ED Provider Notes (Signed)
[place] EMERGENCY DEPARTMENT AT [facility] Provider Note   CSN: [id number] Arrival date & time: 05/17/23  1111     History  Chief Complaint  Patient presents with   Abdominal Pain   Nausea   Emesis   Neck Pain    Chad Spears is a 42 y.o. male.   Abdominal Pain Associated symptoms: nausea and vomiting   Emesis Neck Pain Patient presents for pain.  Medical history includes IBS, anxiety, GERD.  Neck pain has been present for the past 2 to 3 weeks.  He was prescribed a muscle relaxer which does help his symptoms.  Neck pain today was more severe.  This prompted him to come to the ED.  Patient denies any other areas of discomfort.  He does states he will have occasional nausea and vomiting.  He had vomiting last night but has not had any today.  He has been able to eat and drink.  He denies any current abdominal pain or nausea.     Home Medications Prior to Admission medications   Medication Sig Start Date End Date Taking? Authorizing Provider  methocarbamol (ROBAXIN) 500 MG tablet Take 1 tablet (500 mg total) by mouth every 8 (eight) hours as needed for muscle spasms. 05/17/23  Yes Gloris Manchester, MD  clotrimazole-betamethasone (LOTRISONE) cream APPLY CREAM TOPICALLY TWICE DAILY 05/16/23   Plotnikov, Georgina Quint, MD  ketoconazole (NIZORAL) 200 MG tablet Take 1 tablet by mouth twice daily 05/16/23   Plotnikov, Georgina Quint, MD  loratadine (CLARITIN) 10 MG tablet Take 1 tablet (10 mg total) by mouth daily. 02/06/23   Plotnikov, Georgina Quint, MD  meloxicam (MOBIC) 15 MG tablet Take 1 tablet (15 mg total) by mouth daily as needed for pain (pc). 10/30/22   Plotnikov, Georgina Quint, MD  ondansetron (ZOFRAN-ODT) 4 MG disintegrating tablet Take 1 tablet (4 mg total) by mouth every 8 (eight) hours as needed for nausea or vomiting. 01/04/23   Schutt, Edsel Petrin, PA-C  phentermine (ADIPEX-P) 37.5 MG tablet TAKE 1 TABLET BY MOUTH ONCE DAILY BEFORE BREAKFAST 04/29/23   Plotnikov, Georgina Quint, MD   sildenafil (REVATIO) 20 MG tablet TAKE 1 TO 4 TABLETS BY MOUTH ONCE DAILY AS NEEDED 03/20/23   Plotnikov, Georgina Quint, MD  sildenafil (VIAGRA) 100 MG tablet Take 1 tablet (100 mg total) by mouth daily as needed for erectile dysfunction. 02/06/23   Plotnikov, Georgina Quint, MD  triamcinolone cream (KENALOG) 0.1 % Apply 1 Application topically 3 (three) times daily. 03/10/23   Plotnikov, Georgina Quint, MD  Vitamin D, Ergocalciferol, (DRISDOL) 1.25 MG (50000 UNIT) CAPS capsule TAKE 1 CAPSULE BY MOUTH ONCE EVERY MONTH 03/20/23   Plotnikov, Georgina Quint, MD      Allergies    Amitiza [lubiprostone] and Iohexol    Review of Systems   Review of Systems  Gastrointestinal:  Positive for nausea and vomiting.  Musculoskeletal:  Positive for neck pain.  All other systems reviewed and are negative.   Physical Exam Updated Vital Signs BP 103/86 (BP Location: Right Arm)   Pulse 76   Temp 97.9 F (36.6 C) (Oral)   Resp 18   SpO2 98%  Physical Exam Vitals and nursing note reviewed.  Constitutional:      General: He is not in acute distress.    Appearance: He is well-developed. He is not ill-appearing, toxic-appearing or diaphoretic.  HENT:     Head: Normocephalic and atraumatic.     Mouth/Throat:     Mouth: Mucous membranes are  moist.  Eyes:     Extraocular Movements: Extraocular movements intact.     Conjunctiva/sclera: Conjunctivae normal.  Cardiovascular:     Rate and Rhythm: Normal rate and regular rhythm.  Pulmonary:     Effort: Pulmonary effort is normal. No respiratory distress.  Abdominal:     Palpations: Abdomen is soft.     Tenderness: There is no abdominal tenderness.  Musculoskeletal:        General: No swelling.     Cervical back: Neck supple.  Skin:    General: Skin is warm and dry.     Coloration: Skin is not cyanotic, jaundiced or pale.  Neurological:     General: No focal deficit present.     Mental Status: He is alert and oriented to person, place, and time.  Psychiatric:         Mood and Affect: Mood normal.        Behavior: Behavior normal.     ED Results / Procedures / Treatments   Labs (all labs ordered are listed, but only abnormal results are displayed) Labs Reviewed  COMPREHENSIVE METABOLIC PANEL - Abnormal; Notable for the following components:      Result Value   Glucose, Bld 124 (*)    Calcium 8.5 (*)    Total Protein 6.3 (*)    All other components within normal limits  CBC - Abnormal; Notable for the following components:   MCV 78.3 (*)    MCH 24.8 (*)    All other components within normal limits  URINALYSIS, ROUTINE W REFLEX MICROSCOPIC - Abnormal; Notable for the following components:   Specific Gravity, Urine 1.032 (*)    All other components within normal limits  LIPASE, BLOOD  MAGNESIUM    EKG None  Radiology CT Cervical Spine Wo Contrast  Result Date: 05/17/2023 CLINICAL DATA:  Neck pain, acute, no red flags EXAM: CT CERVICAL SPINE WITHOUT CONTRAST TECHNIQUE: Multidetector CT imaging of the cervical spine was performed without intravenous contrast. Multiplanar CT image reconstructions were also generated. RADIATION DOSE REDUCTION: This exam was performed according to the departmental dose-optimization program which includes automated exposure control, adjustment of the mA and/or kV according to patient size and/or use of iterative reconstruction technique. COMPARISON:  None Available. FINDINGS: Alignment: Facet joints are aligned without dislocation or traumatic listhesis. Dens and lateral masses are aligned. Skull base and vertebrae: No acute fracture. No primary bone lesion or focal pathologic process. Soft tissues and spinal canal: No prevertebral fluid or swelling. No visible canal hematoma. Disc levels: Mild degenerative disc disease with uncovertebral spurring at C2-C3. Narrowing of the foramina at this level, more pronounced on the right. Remaining disc heights are relatively well preserved. Facet joints within normal limits. Upper  chest: Included lung apices are clear. Other: Large lipoma within the subcutaneous soft tissues of the neck measuring approximately 8.7 x 4.6 x 5.0 cm. No complicating features by CT. IMPRESSION: 1. No acute fracture or traumatic listhesis of the cervical spine. 2. Degenerative disc disease C2-C3. 3. Large lipoma within the subcutaneous soft tissues of the neck measuring approximately 8.7 x 4.6 x 5.0 cm. No complicating features by CT. Electronically Signed   By: Duanne Guess D.O.   On: 05/17/2023 16:03    Procedures Procedures    Medications Ordered in ED Medications  lactated ringers bolus 1,000 mL (0 mLs Intravenous Stopped 05/17/23 1708)  ketorolac (TORADOL) 15 MG/ML injection 15 mg (15 mg Intravenous Given 05/17/23 1534)  methocarbamol (ROBAXIN) tablet 1,000 mg (1,000  mg Oral Given 05/17/23 1532)  metoCLOPramide (REGLAN) injection 10 mg (10 mg Intravenous Given 05/17/23 1534)    ED Course/ Medical Decision Making/ A&P                             Medical Decision Making Amount and/or Complexity of Data Reviewed Labs: ordered. Radiology: ordered.  Risk Prescription drug management.   Patient presented for neck pain.  Although it is documented in triage note that he was having abdominal pain, patient denies this.  He states that he did have some vomiting yesterday but currently denies any nausea.  He states that nausea and vomiting is chronically intermittent for him.  He has been able to eat and drink today.  He is well-appearing on exam.  His abdomen is soft and without tenderness.  On his neck, he does have bilateral paraspinous muscle tenderness.  He was given multimodal pain control.  CT scan showed large lipoma in the subcutaneous soft tissues of his neck.  It is hard to say if this is contributing to his symptoms.  CT also showed some degenerative disc disease.  Lab work is reassuring.  Patient had solved symptoms after pain control in the ED.  He does request discharge at this time.   He was advised to take ibuprofen, Tylenol, muscle relaxer at home as needed and follow-up with PCP.  He was discharged in good condition.        Final Clinical Impression(s) / ED Diagnoses Final diagnoses:  Neck pain    Rx / DC Orders ED Discharge Orders          Ordered    methocarbamol (ROBAXIN) 500 MG tablet  Every 8 hours PRN        05/17/23 1724              Gloris Manchester, MD 05/17/23 1725

## 2023-05-17 NOTE — Discharge Instructions (Signed)
Take ibuprofen and Tylenol as needed for pain.  A prescription for muscle relaxer was sent to your pharmacy.  Take as needed.  Follow-up with your primary care doctor.

## 2023-05-17 NOTE — ED Notes (Addendum)
Pt alert, NAD, calm, interactive, resps e/u, speaking in clear sentences, c/o posterior upper neck/ lower occipital HA pain. Denies NV, visual changes, dizziness or sob. To CT.

## 2023-05-17 NOTE — ED Triage Notes (Signed)
Pt. Stated, I started having throw up on Thursday at work . I nauseated and it comes and goes. Ive also had neck pain/cramping for 2 weeks.

## 2023-05-17 NOTE — ED Notes (Signed)
Denies pain, states, "ready to leave".

## 2023-06-19 ENCOUNTER — Ambulatory Visit: Payer: Self-pay | Admitting: Internal Medicine

## 2023-07-07 ENCOUNTER — Telehealth: Payer: Self-pay | Admitting: Internal Medicine

## 2023-07-07 NOTE — Telephone Encounter (Signed)
Pt called wanting know if Dr. Macario Golds can prescribe medication listed below or do he need to come in and  be seen. Please advise.  methocarbamol (ROBAXIN) 500 MG tablet

## 2023-07-08 MED ORDER — METHOCARBAMOL 500 MG PO TABS: ORAL_TABLET | ORAL | 1 refills | Status: DC

## 2023-07-08 NOTE — Telephone Encounter (Signed)
Okay.  Thanks.

## 2023-07-08 NOTE — Telephone Encounter (Signed)
Notified pt with MD response../[email or web address] 

## 2023-07-24 ENCOUNTER — Other Ambulatory Visit: Payer: Self-pay | Admitting: Internal Medicine

## 2023-07-25 ENCOUNTER — Other Ambulatory Visit: Payer: Self-pay | Admitting: Internal Medicine

## 2023-08-21 ENCOUNTER — Ambulatory Visit: Payer: BC Managed Care – PPO | Admitting: Internal Medicine

## 2023-08-21 VITALS — BP 112/60 | HR 67 | Temp 98.2°F | Ht 72.0 in | Wt 232.0 lb

## 2023-08-21 DIAGNOSIS — N528 Other male erectile dysfunction: Secondary | ICD-10-CM

## 2023-08-21 DIAGNOSIS — K219 Gastro-esophageal reflux disease without esophagitis: Secondary | ICD-10-CM | POA: Diagnosis not present

## 2023-08-21 DIAGNOSIS — G4700 Insomnia, unspecified: Secondary | ICD-10-CM | POA: Diagnosis not present

## 2023-08-21 DIAGNOSIS — M545 Low back pain, unspecified: Secondary | ICD-10-CM | POA: Diagnosis not present

## 2023-08-21 DIAGNOSIS — F419 Anxiety disorder, unspecified: Secondary | ICD-10-CM | POA: Diagnosis not present

## 2023-08-21 DIAGNOSIS — E559 Vitamin D deficiency, unspecified: Secondary | ICD-10-CM

## 2023-08-21 DIAGNOSIS — Z23 Encounter for immunization: Secondary | ICD-10-CM | POA: Diagnosis not present

## 2023-08-21 DIAGNOSIS — G8929 Other chronic pain: Secondary | ICD-10-CM

## 2023-08-21 DIAGNOSIS — R21 Rash and other nonspecific skin eruption: Secondary | ICD-10-CM

## 2023-08-21 DIAGNOSIS — E66811 Obesity, class 1: Secondary | ICD-10-CM | POA: Diagnosis not present

## 2023-08-21 DIAGNOSIS — R739 Hyperglycemia, unspecified: Secondary | ICD-10-CM

## 2023-08-21 MED ORDER — METHOCARBAMOL 500 MG PO TABS: ORAL_TABLET | ORAL | 1 refills | Status: DC

## 2023-08-21 MED ORDER — VITAMIN D (ERGOCALCIFEROL) 1.25 MG (50000 UNIT) PO CAPS: ORAL_CAPSULE | ORAL | 3 refills | Status: DC

## 2023-08-21 MED ORDER — TRIAMCINOLONE ACETONIDE 0.1 % EX CREA: 1.0000 | TOPICAL_CREAM | Freq: Three times a day (TID) | CUTANEOUS | 3 refills | Status: DC

## 2023-08-21 MED ORDER — PHENTERMINE HCL 37.5 MG PO TABS: 37.5000 mg | ORAL_TABLET | Freq: Every day | ORAL | 0 refills | Status: DC

## 2023-08-21 MED ORDER — CLOTRIMAZOLE-BETAMETHASONE 1-0.05 % EX CREA: TOPICAL_CREAM | Freq: Two times a day (BID) | CUTANEOUS | 1 refills | Status: AC

## 2023-08-21 MED ORDER — MELOXICAM 15 MG PO TABS: 15.0000 mg | ORAL_TABLET | Freq: Every day | ORAL | 1 refills | Status: DC | PRN

## 2023-08-21 NOTE — Progress Notes (Signed)
Subjective:  Patient ID: Chad Spears, male    DOB: 07/13/1981  Age: 42 y.o. MRN: [id number]  CC: Medication Refill   HPI Chad Spears presents for rash, LBP, obesity  Outpatient Medications Prior to Visit  Medication Sig Dispense Refill   loratadine (CLARITIN) 10 MG tablet Take 1 tablet (10 mg total) by mouth daily. 90 tablet 3   ondansetron (ZOFRAN-ODT) 4 MG disintegrating tablet Take 1 tablet (4 mg total) by mouth every 8 (eight) hours as needed for nausea or vomiting. 15 tablet 0   sildenafil (REVATIO) 20 MG tablet TAKE 1 TO 4 TABLETS BY MOUTH ONCE DAILY AS NEEDED 240 tablet 0   clotrimazole-betamethasone (LOTRISONE) cream APPLY CREAM TOPICALLY TWICE DAILY 90 g 0   ketoconazole (NIZORAL) 200 MG tablet Take 1 tablet by mouth twice daily 28 tablet 0   meloxicam (MOBIC) 15 MG tablet Take 1 tablet (15 mg total) by mouth daily as needed for pain (pc). 90 tablet 1   methocarbamol (ROBAXIN) 500 MG tablet Take 1 tablet at bedtime as needed for back spasm/back pain 30 tablet 1   phentermine (ADIPEX-P) 37.5 MG tablet TAKE 1 TABLET BY MOUTH ONCE DAILY BEFORE BREAKFAST 90 tablet 0   sildenafil (VIAGRA) 100 MG tablet Take 1 tablet (100 mg total) by mouth daily as needed for erectile dysfunction. 12 tablet 5   triamcinolone cream (KENALOG) 0.1 % Apply 1 Application topically 3 (three) times daily. 80 g 3   Vitamin D, Ergocalciferol, (DRISDOL) 1.25 MG (50000 UNIT) CAPS capsule TAKE 1 CAPSULE BY MOUTH ONCE EVERY MONTH 3 capsule 0   No facility-administered medications prior to visit.    ROS: Review of Systems  Constitutional:  Positive for unexpected weight change. Negative for appetite change and fatigue.  HENT:  Negative for congestion, nosebleeds, sneezing, sore throat and trouble swallowing.   Eyes:  Negative for itching and visual disturbance.  Respiratory:  Negative for cough.   Cardiovascular:  Negative for chest pain, palpitations and leg swelling.  Gastrointestinal:  Negative for  abdominal distention, blood in stool, diarrhea and nausea.  Genitourinary:  Negative for frequency and hematuria.  Musculoskeletal:  Negative for back pain, gait problem, joint swelling and neck pain.  Skin:  Positive for rash.  Neurological:  Negative for dizziness, tremors, speech difficulty and weakness.  Psychiatric/Behavioral:  Negative for agitation, dysphoric mood and sleep disturbance. The patient is not nervous/anxious.     Objective:  BP 112/60 (BP Location: Left Arm, Patient Position: Sitting, Cuff Size: Normal)   Pulse 67   Temp 98.2 F (36.8 C) (Oral)   Ht 6' (1.829 m)   Wt 232 lb (105.2 kg)   SpO2 97%   BMI 31.46 kg/m   BP Readings from Last 3 Encounters:  09/08/23 110/70  09/06/23 111/85  08/21/23 112/60    Wt Readings from Last 3 Encounters:  09/08/23 238 lb (108 kg)  09/06/23 225 lb (102.1 kg)  08/21/23 232 lb (105.2 kg)    Physical Exam Constitutional:      General: He is not in acute distress.    Appearance: He is well-developed. He is obese.     Comments: NAD  Eyes:     Conjunctiva/sclera: Conjunctivae normal.     Pupils: Pupils are equal, round, and reactive to light.  Neck:     Thyroid: No thyromegaly.     Vascular: No JVD.  Cardiovascular:     Rate and Rhythm: Normal rate and regular rhythm.     Heart  sounds: Normal heart sounds. No murmur heard.    No friction rub. No gallop.  Pulmonary:     Effort: Pulmonary effort is normal. No respiratory distress.     Breath sounds: Normal breath sounds. No wheezing or rales.  Chest:     Chest wall: No tenderness.  Abdominal:     General: Bowel sounds are normal. There is no distension.     Palpations: Abdomen is soft. There is no mass.     Tenderness: There is no abdominal tenderness. There is no guarding or rebound.  Musculoskeletal:        General: No tenderness. Normal range of motion.     Cervical back: Normal range of motion.  Lymphadenopathy:     Cervical: No cervical adenopathy.  Skin:     General: Skin is warm and dry.     Findings: Rash present.  Neurological:     Mental Status: He is alert and oriented to person, place, and time.     Cranial Nerves: No cranial nerve deficit.     Motor: No abnormal muscle tone.     Coordination: Coordination normal.     Gait: Gait normal.     Deep Tendon Reflexes: Reflexes are normal and symmetric.  Psychiatric:        Behavior: Behavior normal.        Thought Content: Thought content normal.        Judgment: Judgment normal.   Scattered papular rash on the trunk and extremities, excoriations  Lab Results  Component Value Date   WBC 5.9 09/06/2023   HGB 13.9 09/06/2023   HCT 43.8 09/06/2023   PLT 295 09/06/2023   GLUCOSE 105 (H) 09/06/2023   CHOL 158 03/20/2023   TRIG 52.0 03/20/2023   HDL 56.30 03/20/2023   LDLCALC 91 03/20/2023   ALT 21 09/06/2023   AST 22 09/06/2023   NA 138 09/06/2023   K 3.8 09/06/2023   CL 104 09/06/2023   CREATININE 1.00 09/06/2023   BUN 14 09/06/2023   CO2 26 09/06/2023   TSH 1.97 08/22/2023   PSA 0.46 03/20/2023   INR 1.08 08/23/2010   HGBA1C 5.6 08/22/2023    CT Cervical Spine Wo Contrast  Result Date: 05/17/2023 CLINICAL DATA:  Neck pain, acute, no red flags EXAM: CT CERVICAL SPINE WITHOUT CONTRAST TECHNIQUE: Multidetector CT imaging of the cervical spine was performed without intravenous contrast. Multiplanar CT image reconstructions were also generated. RADIATION DOSE REDUCTION: This exam was performed according to the departmental dose-optimization program which includes automated exposure control, adjustment of the mA and/or kV according to patient size and/or use of iterative reconstruction technique. COMPARISON:  None Available. FINDINGS: Alignment: Facet joints are aligned without dislocation or traumatic listhesis. Dens and lateral masses are aligned. Skull base and vertebrae: No acute fracture. No primary bone lesion or focal pathologic process. Soft tissues and spinal canal: No  prevertebral fluid or swelling. No visible canal hematoma. Disc levels: Mild degenerative disc disease with uncovertebral spurring at C2-C3. Narrowing of the foramina at this level, more pronounced on the right. Remaining disc heights are relatively well preserved. Facet joints within normal limits. Upper chest: Included lung apices are clear. Other: Large lipoma within the subcutaneous soft tissues of the neck measuring approximately 8.7 x 4.6 x 5.0 cm. No complicating features by CT. IMPRESSION: 1. No acute fracture or traumatic listhesis of the cervical spine. 2. Degenerative disc disease C2-C3. 3. Large lipoma within the subcutaneous soft tissues of the neck measuring approximately 8.7  x 4.6 x 5.0 cm. No complicating features by CT. Electronically Signed   By: Duanne Guess D.O.   On: 05/17/2023 16:03    Assessment & Plan:   Problem List Items Addressed This Visit     Anxiety disorder    Doing well      Relevant Orders   Comprehensive metabolic panel (Completed)   Hemoglobin A1c (Completed)   T4, free (Completed)   TSH (Completed)   VITAMIN D 25 Hydroxy (Vit-D Deficiency, Fractures) (Completed)   Urinalysis (Completed)   INSOMNIA, CHRONIC   Esophageal reflux    Pepcid  po      BACK PAIN, LUMBAR    MSK pain, recurrrent Use Meloxicam 15 mg prn pc Methocarbamol prn On Vit D Wt loss      Relevant Medications   meloxicam (MOBIC) 15 MG tablet   methocarbamol (ROBAXIN) 500 MG tablet   Erectile dysfunction    Cont on Revatio prn      Obesity (BMI 30.0-34.9)    On Phentermine  Potential benefits of a long term adipex use as well as potential risks  and complications were explained to the patient and were aknowledged.      Relevant Orders   Comprehensive metabolic panel (Completed)   Hemoglobin A1c (Completed)   T4, free (Completed)   TSH (Completed)   VITAMIN D 25 Hydroxy (Vit-D Deficiency, Fractures) (Completed)   Urinalysis (Completed)   Vitamin D deficiency    On  Vit D Rx      Relevant Orders   VITAMIN D 25 Hydroxy (Vit-D Deficiency, Fractures) (Completed)   Rash and nonspecific skin eruption    Intertrigo - Lotrisone prn Other papular rash - Triamcinolone cream Derm ref      Other Visit Diagnoses     Need for influenza vaccination    -  Primary   Relevant Orders   Flu vaccine trivalent PF, 6mos and older(Flulaval,Afluria,Fluarix,Fluzone) (Completed)   Hyperglycemia       Relevant Orders   Comprehensive metabolic panel (Completed)   Hemoglobin A1c (Completed)   T4, free (Completed)   TSH (Completed)   Urinalysis (Completed)         Meds ordered this encounter  Medications   clotrimazole-betamethasone (LOTRISONE) cream    Sig: Apply topically 2 (two) times daily. Use for groin rash    Dispense:  90 g    Refill:  1   meloxicam (MOBIC) 15 MG tablet    Sig: Take 1 tablet (15 mg total) by mouth daily as needed for pain (pc).    Dispense:  90 tablet    Refill:  1   methocarbamol (ROBAXIN) 500 MG tablet    Sig: Take 1 tablet at bedtime as needed for back spasm/back painTake 1 tablet at bedtime as needed for back spasm/back pain    Dispense:  90 tablet    Refill:  1   DISCONTD: phentermine (ADIPEX-P) 37.5 MG tablet    Sig: Take 1 tablet (37.5 mg total) by mouth daily before breakfast.    Dispense:  90 tablet    Refill:  0   triamcinolone cream (KENALOG) 0.1 %    Sig: Apply 1 Application topically 3 (three) times daily. On skin rash    Dispense:  80 g    Refill:  3   Vitamin D, Ergocalciferol, (DRISDOL) 1.25 MG (50000 UNIT) CAPS capsule    Sig: TAKE 1 CAPSULE BY MOUTH ONCE EVERY MONTH    Dispense:  3 capsule    Refill:  3      Follow-up: No follow-ups on file.  Sonda Primes, MD

## 2023-08-21 NOTE — Assessment & Plan Note (Signed)
On Phentermine  Potential benefits of a long term adipex use as well as potential risks  and complications were explained to the patient and were aknowledged.

## 2023-08-21 NOTE — Assessment & Plan Note (Signed)
Doing well 

## 2023-08-21 NOTE — Assessment & Plan Note (Signed)
Pepcid po 

## 2023-08-21 NOTE — Assessment & Plan Note (Addendum)
MSK pain, recurrrent Use Meloxicam 15 mg prn pc Methocarbamol prn On Vit D Wt loss

## 2023-08-21 NOTE — Assessment & Plan Note (Signed)
On Vit D Rx

## 2023-08-21 NOTE — Assessment & Plan Note (Signed)
Intertrigo - Lotrisone prn Other papular rash - Triamcinolone cream Derm ref

## 2023-08-21 NOTE — Assessment & Plan Note (Signed)
Cont on Revatio prn

## 2023-08-22 LAB — COMPREHENSIVE METABOLIC PANEL
ALT: 19 U/L (ref 0–53)
AST: 17 U/L (ref 0–37)
Albumin: 4.2 g/dL (ref 3.5–5.2)
Alkaline Phosphatase: 76 U/L (ref 39–117)
BUN: 12 mg/dL (ref 6–23)
CO2: 24 meq/L (ref 19–32)
Calcium: 9.5 mg/dL (ref 8.4–10.5)
Chloride: 107 meq/L (ref 96–112)
Creatinine, Ser: 0.76 mg/dL (ref 0.40–1.50)
GFR: 109.15 mL/min (ref >60.00)
Glucose, Bld: 103 mg/dL — ABNORMAL HIGH (ref 70–99)
Potassium: 3.9 meq/L (ref 3.5–5.1)
Sodium: 141 meq/L (ref 135–145)
Total Bilirubin: 0.3 mg/dL (ref 0.2–1.2)
Total Protein: 6.6 g/dL (ref 6.0–8.3)

## 2023-08-22 LAB — URINALYSIS
Bilirubin Urine: NEGATIVE
Hgb urine dipstick: NEGATIVE
Ketones, ur: NEGATIVE
Leukocytes,Ua: NEGATIVE
Nitrite: NEGATIVE
Specific Gravity, Urine: 1.02 (ref 1.000–1.030)
Total Protein, Urine: NEGATIVE
Urine Glucose: NEGATIVE
Urobilinogen, UA: 0.2 (ref 0.0–1.0)
pH: 6 (ref 5.0–8.0)

## 2023-08-22 LAB — HEMOGLOBIN A1C: Hgb A1c MFr Bld: 5.6 % (ref 4.6–6.5)

## 2023-08-22 LAB — VITAMIN D 25 HYDROXY (VIT D DEFICIENCY, FRACTURES): VITD: 43.39 ng/mL (ref 30.00–100.00)

## 2023-08-22 LAB — TSH: TSH: 1.97 u[IU]/mL (ref 0.35–5.50)

## 2023-08-22 LAB — T4, FREE: Free T4: 0.7 ng/dL (ref 0.60–1.60)

## 2023-09-06 ENCOUNTER — Encounter (HOSPITAL_COMMUNITY): Payer: Self-pay

## 2023-09-06 ENCOUNTER — Emergency Department (HOSPITAL_COMMUNITY): Payer: BC Managed Care – PPO

## 2023-09-06 ENCOUNTER — Emergency Department (HOSPITAL_COMMUNITY)
Admission: EM | Admit: 2023-09-06 | Discharge: 2023-09-06 | Disposition: A | Discharge status: Home / Self Care | Payer: BC Managed Care – PPO | Attending: Emergency Medicine | Admitting: Emergency Medicine

## 2023-09-06 ENCOUNTER — Other Ambulatory Visit: Payer: Self-pay

## 2023-09-06 DIAGNOSIS — R1013 Epigastric pain: Secondary | ICD-10-CM | POA: Diagnosis not present

## 2023-09-06 DIAGNOSIS — R1011 Right upper quadrant pain: Secondary | ICD-10-CM | POA: Insufficient documentation

## 2023-09-06 DIAGNOSIS — L309 Dermatitis, unspecified: Secondary | ICD-10-CM | POA: Diagnosis not present

## 2023-09-06 DIAGNOSIS — R21 Rash and other nonspecific skin eruption: Secondary | ICD-10-CM | POA: Diagnosis present

## 2023-09-06 LAB — URINALYSIS, ROUTINE W REFLEX MICROSCOPIC
Bacteria, UA: NONE SEEN
Bilirubin Urine: NEGATIVE
Glucose, UA: NEGATIVE mg/dL
Hgb urine dipstick: NEGATIVE
Ketones, ur: NEGATIVE mg/dL
Leukocytes,Ua: NEGATIVE
Nitrite: NEGATIVE
Protein, ur: 30 mg/dL — AB
Specific Gravity, Urine: 1.03 (ref 1.005–1.030)
pH: 5 (ref 5.0–8.0)

## 2023-09-06 LAB — COMPREHENSIVE METABOLIC PANEL
ALT: 21 U/L (ref 0–44)
AST: 22 U/L (ref 15–41)
Albumin: 3.8 g/dL (ref 3.5–5.0)
Alkaline Phosphatase: 80 U/L (ref 38–126)
Anion gap: 8 (ref 5–15)
BUN: 14 mg/dL (ref 6–20)
CO2: 26 mmol/L (ref 22–32)
Calcium: 9.3 mg/dL (ref 8.9–10.3)
Chloride: 104 mmol/L (ref 98–111)
Creatinine, Ser: 1 mg/dL (ref 0.61–1.24)
GFR, Estimated: >60 mL/min (ref >60)
Glucose, Bld: 105 mg/dL — ABNORMAL HIGH (ref 70–99)
Potassium: 3.8 mmol/L (ref 3.5–5.1)
Sodium: 138 mmol/L (ref 135–145)
Total Bilirubin: 0.8 mg/dL (ref 0.3–1.2)
Total Protein: 7.2 g/dL (ref 6.5–8.1)

## 2023-09-06 LAB — LIPASE, BLOOD: Lipase: 25 U/L (ref 11–51)

## 2023-09-06 LAB — CBC
HCT: 43.8 % (ref 39.0–52.0)
Hemoglobin: 13.9 g/dL (ref 13.0–17.0)
MCH: 24.7 pg — ABNORMAL LOW (ref 26.0–34.0)
MCHC: 31.7 g/dL (ref 30.0–36.0)
MCV: 77.9 fL — ABNORMAL LOW (ref 80.0–100.0)
Platelets: 295 10*3/uL (ref 150–400)
RBC: 5.62 MIL/uL (ref 4.22–5.81)
RDW: 14.2 % (ref 11.5–15.5)
WBC: 5.9 10*3/uL (ref 4.0–10.5)
nRBC: 0 % (ref 0.0–0.2)

## 2023-09-06 MED ORDER — HYDROXYZINE HCL 25 MG PO TABS
25.0000 mg | ORAL_TABLET | Freq: Once | ORAL | Status: AC
  Administered 2023-09-06: 25 mg via ORAL
  Filled 2023-09-06: qty 1

## 2023-09-06 MED ORDER — ONDANSETRON 4 MG PO TBDP: 4.0000 mg | ORAL_TABLET | Freq: Three times a day (TID) | ORAL | 0 refills | Status: DC | PRN

## 2023-09-06 MED ORDER — EUCERIN ORIGINAL HEALING EX CREA: TOPICAL_CREAM | CUTANEOUS | 0 refills | Status: AC | PRN

## 2023-09-06 MED ORDER — ONDANSETRON HCL 4 MG/2ML IJ SOLN
4.0000 mg | Freq: Once | INTRAMUSCULAR | Status: AC
  Administered 2023-09-06: 4 mg via INTRAVENOUS
  Filled 2023-09-06: qty 2

## 2023-09-06 MED ORDER — HYDROXYZINE HCL 25 MG PO TABS: 25.0000 mg | ORAL_TABLET | Freq: Four times a day (QID) | ORAL | 0 refills | Status: DC

## 2023-09-06 MED ORDER — SODIUM CHLORIDE 0.9 % IV BOLUS
1000.0000 mL | Freq: Once | INTRAVENOUS | Status: AC
  Administered 2023-09-06: 1000 mL via INTRAVENOUS

## 2023-09-06 NOTE — ED Triage Notes (Signed)
Pt has multiple complaints:  Pt c.o 4 months of rash on his arms and legs with itching. Pt also c.o 4 days of right sided abd cramping with vomiting every time he tries to eat. Pt denies pain at this time but feels nauseated.

## 2023-09-06 NOTE — ED Provider Notes (Signed)
[place] EMERGENCY DEPARTMENT AT [facility] Provider Note   CSN: [id number] Arrival date & time: 09/06/23  2841     History  Chief Complaint  Patient presents with   Abdominal Pain   Rash    Chad Spears is a 42 y.o. male.  Pt is a 42 yo male with pmhx significant for GERD, hx TB (in [place]), and eczema.  Pt has seen his pcp several times for a rash.  He's been prescribed multiple medications which he said has not worked.  His pcp has referred him to dermatology several times, but pt said he's never been called for an appt and never called himself to make an appt.  Pt said he's had rash and itching to his arms and legs.  He also has abd pain and n/v.  He said that has been going on for 4 days.  No fevers.         Home Medications Prior to Admission medications   Medication Sig Start Date End Date Taking? Authorizing Provider  hydrOXYzine (ATARAX) 25 MG tablet Take 1 tablet (25 mg total) by mouth every 6 (six) hours. 09/06/23  Yes Jacalyn Lefevre, MD  ondansetron (ZOFRAN-ODT) 4 MG disintegrating tablet Take 1 tablet (4 mg total) by mouth every 8 (eight) hours as needed. 09/06/23  Yes Jacalyn Lefevre, MD  Skin Protectants, Misc. (EUCERIN) cream Apply topically as needed for dry skin. 09/06/23  Yes Jacalyn Lefevre, MD  clotrimazole-betamethasone (LOTRISONE) cream Apply topically 2 (two) times daily. Use for groin rash 08/21/23   Plotnikov, Georgina Quint, MD  loratadine (CLARITIN) 10 MG tablet Take 1 tablet (10 mg total) by mouth daily. 02/06/23   Plotnikov, Georgina Quint, MD  meloxicam (MOBIC) 15 MG tablet Take 1 tablet (15 mg total) by mouth daily as needed for pain (pc). 08/21/23   Plotnikov, Georgina Quint, MD  methocarbamol (ROBAXIN) 500 MG tablet Take 1 tablet at bedtime as needed for back spasm/back painTake 1 tablet at bedtime as needed for back spasm/back pain 08/21/23   Plotnikov, Georgina Quint, MD  ondansetron (ZOFRAN-ODT) 4 MG disintegrating tablet Take 1 tablet (4 mg total) by  mouth every 8 (eight) hours as needed for nausea or vomiting. 01/04/23   Schutt, Edsel Petrin, PA-C  phentermine (ADIPEX-P) 37.5 MG tablet Take 1 tablet (37.5 mg total) by mouth daily before breakfast. 08/21/23   Plotnikov, Georgina Quint, MD  sildenafil (REVATIO) 20 MG tablet TAKE 1 TO 4 TABLETS BY MOUTH ONCE DAILY AS NEEDED 07/24/23   Plotnikov, Georgina Quint, MD  triamcinolone cream (KENALOG) 0.1 % Apply 1 Application topically 3 (three) times daily. On skin rash 08/21/23   Plotnikov, Georgina Quint, MD  Vitamin D, Ergocalciferol, (DRISDOL) 1.25 MG (50000 UNIT) CAPS capsule TAKE 1 CAPSULE BY MOUTH ONCE EVERY MONTH 08/21/23   Plotnikov, Georgina Quint, MD      Allergies    Amitiza [lubiprostone] and Iohexol    Review of Systems   Review of Systems  Gastrointestinal:  Positive for abdominal pain, nausea and vomiting.  Skin:  Positive for rash.  All other systems reviewed and are negative.   Physical Exam Updated Vital Signs BP 119/80 (BP Location: Right Arm)   Pulse 72   Temp 98.6 F (37 C)   Resp 18   Ht 6' (1.829 m)   Wt 102.1 kg   SpO2 100%   BMI 30.52 kg/m  Physical Exam Vitals and nursing note reviewed.  Constitutional:      Appearance: He is well-developed.  HENT:     Head: Normocephalic and atraumatic.     Mouth/Throat:     Mouth: Mucous membranes are moist.     Pharynx: Oropharynx is clear.  Eyes:     Extraocular Movements: Extraocular movements intact.     Pupils: Pupils are equal, round, and reactive to light.  Cardiovascular:     Rate and Rhythm: Normal rate and regular rhythm.     Heart sounds: Normal heart sounds.  Pulmonary:     Effort: Pulmonary effort is normal.     Breath sounds: Normal breath sounds.  Abdominal:     General: Bowel sounds are normal.     Palpations: Abdomen is soft.     Tenderness: There is abdominal tenderness in the right upper quadrant and epigastric area.  Skin:    General: Skin is warm.     Capillary Refill: Capillary refill takes less than 2  seconds.     Comments: Pt has a few lesions on his legs and arms.  They are macular.  No hives. No pustules.  No scaling or crusting.   Neurological:     General: No focal deficit present.     Mental Status: He is alert and oriented to person, place, and time.  Psychiatric:        Mood and Affect: Mood normal.        Behavior: Behavior normal.     ED Results / Procedures / Treatments   Labs (all labs ordered are listed, but only abnormal results are displayed) Labs Reviewed  COMPREHENSIVE METABOLIC PANEL - Abnormal; Notable for the following components:      Result Value   Glucose, Bld 105 (*)    All other components within normal limits  CBC - Abnormal; Notable for the following components:   MCV 77.9 (*)    MCH 24.7 (*)    All other components within normal limits  URINALYSIS, ROUTINE W REFLEX MICROSCOPIC - Abnormal; Notable for the following components:   Color, Urine AMBER (*)    APPearance HAZY (*)    Protein, ur 30 (*)    All other components within normal limits  LIPASE, BLOOD    EKG EKG Interpretation Date/Time:  Saturday September 06 2023 09:34:26 EDT Ventricular Rate:  67 PR Interval:  168 QRS Duration:  78 QT Interval:  374 QTC Calculation: 395 R Axis:   47  Text Interpretation: Normal sinus rhythm Normal ECG When compared with ECG of 25-Apr-2020 11:13, PREVIOUS ECG IS PRESENT No significant change since last tracing Confirmed by Jacalyn Lefevre [phone]) on 09/06/2023 9:36:26 AM  Radiology CT ABDOMEN PELVIS WO CONTRAST  Result Date: 09/06/2023 CLINICAL DATA:  Abdominal pain, acute, nonlocalized EXAM: CT ABDOMEN AND PELVIS WITHOUT CONTRAST TECHNIQUE: Multidetector CT imaging of the abdomen and pelvis was performed following the standard protocol without IV contrast. RADIATION DOSE REDUCTION: This exam was performed according to the departmental dose-optimization program which includes automated exposure control, adjustment of the mA and/or kV according to patient  size and/or use of iterative reconstruction technique. COMPARISON:  December 05, 2022, April 25, 2020, September 05, 2011 FINDINGS: Evaluation is limited by lack of IV contrast. Lower chest: No acute abnormality. Hepatobiliary: Unremarkable noncontrast appearance of the liver and gallbladder. Pancreas: No peripancreatic fat stranding. Spleen: Unremarkable. Adrenals/Urinary Tract: Adrenal glands are unremarkable. No hydronephrosis. No obstructing nephrolithiasis. Bladder is unremarkable. Stomach/Bowel: No evidence of bowel obstruction. Appendix is normal. Stomach is unremarkable. Vascular/Lymphatic: Abdominal aorta is normal in course and caliber. Revisualization of multiple coarsely  calcified retroperitoneal and pelvic lymph nodes most consistent with sequela of prior granulomatous infection in this patient with history of remote tuberculosis. Similar appearance of asymmetrically enlarged RIGHT inguinal lymph nodes dating back to at least 2021, likely reactive/sequela of prior infection. Representative RIGHT external iliac lymph node measures 12 mm in the short axis, unchanged since 2021 (series 3, image 81). No new suspicious lymphadenopathy is identified. Reproductive: Prostate is present. Other: No free air or free fluid. Musculoskeletal: No acute or significant osseous findings. Unchanged sclerotic lesion of L5 since at least 2012, consistent with a benign etiology. IMPRESSION: 1. No acute abnormality of the abdomen or pelvis. 2. Similar appearance of asymmetrically enlarged RIGHT inguinal lymph nodes dating back to at least 2021, likely reactive/sequela of prior infection. Electronically Signed   By: Meda Klinefelter M.D.   On: 09/06/2023 12:15    Procedures Procedures    Medications Ordered in ED Medications  sodium chloride 0.9 % bolus 1,000 mL (1,000 mLs Intravenous New Bag/Given 09/06/23 0938)  ondansetron (ZOFRAN) injection 4 mg (4 mg Intravenous Given 09/06/23 0938)  hydrOXYzine (ATARAX) tablet 25  mg (25 mg Oral Given 09/06/23 0626)    ED Course/ Medical Decision Making/ A&P Clinical Course as of 09/06/23 1230  Sat Sep 06, 2023  1220 CT ABDOMEN PELVIS WO CONTRAST [JH]    Clinical Course User Index [JH] Jacalyn Lefevre, MD                                 Medical Decision Making Amount and/or Complexity of Data Reviewed Labs: ordered. Radiology: ordered. Decision-making details documented in ED Course.  Risk OTC drugs. Prescription drug management.   This patient presents to the ED for concern of rash and abd pain, this involves an extensive number of treatment options, and is a complaint that carries with it a high risk of complications and morbidity.  The differential diagnosis includes dermatitis, cholelithiasis, pancreatitis, gastritis, viral illness   Co morbidities that complicate the patient evaluation  GERD, hx TB (in [place]), and eczema   Additional history obtained:  Additional history obtained from epic chart review  Lab Tests:  I Ordered, and personally interpreted labs.  The pertinent results include:  cmp nl, ua + protein, cbc nl   Imaging Studies ordered:  I ordered imaging studies including ct abd/pelvis  I independently visualized and interpreted imaging which showed  . No acute abnormality of the abdomen or pelvis.  2. Similar appearance of asymmetrically enlarged RIGHT inguinal  lymph nodes dating back to at least 2021, likely reactive/sequela of  prior infection.   I agree with the radiologist interpretation   Cardiac Monitoring:  The patient was maintained on a cardiac monitor.  I personally viewed and interpreted the cardiac monitored which showed an underlying rhythm of: nsr   Medicines ordered and prescription drug management:  I ordered medication including atarax  for itching, zofran for n/v  Reevaluation of the patient after these medicines showed that the patient improved I have reviewed the patients home medicines and have  made adjustments as needed   Test Considered:  ct   Problem List / ED Course:  Abd pain:  resolved.  Pt able to tolerate po fluids.  He is feeling much better.  He is to return if worse.  F/u with pcp. Rash:  itchiness improved with atarax.  Pt d/c with atarax and eucerin.  He is given the number of derm  to f/u.   Reevaluation:  After the interventions noted above, I reevaluated the patient and found that they have :improved   Social Determinants of Health:  Lives at home   Dispostion:  After consideration of the diagnostic results and the patients response to treatment, I feel that the patent would benefit from discharge with outpatient f/u.          Final Clinical Impression(s) / ED Diagnoses Final diagnoses:  Eczema, unspecified type  Epigastric pain    Rx / DC Orders ED Discharge Orders          Ordered    hydrOXYzine (ATARAX) 25 MG tablet  Every 6 hours        09/06/23 1224    ondansetron (ZOFRAN-ODT) 4 MG disintegrating tablet  Every 8 hours PRN        09/06/23 1224    Skin Protectants, Misc. (EUCERIN) cream  As needed        09/06/23 1224              Jacalyn Lefevre, MD 09/06/23 1230

## 2023-09-08 ENCOUNTER — Ambulatory Visit (INDEPENDENT_AMBULATORY_CARE_PROVIDER_SITE_OTHER): Payer: BC Managed Care – PPO | Admitting: Internal Medicine

## 2023-09-08 ENCOUNTER — Encounter: Payer: Self-pay | Admitting: Internal Medicine

## 2023-09-08 VITALS — BP 110/70 | HR 85 | Temp 97.6°F | Ht 72.0 in | Wt 238.0 lb

## 2023-09-08 DIAGNOSIS — R635 Abnormal weight gain: Secondary | ICD-10-CM | POA: Diagnosis not present

## 2023-09-08 DIAGNOSIS — R21 Rash and other nonspecific skin eruption: Secondary | ICD-10-CM

## 2023-09-08 DIAGNOSIS — K219 Gastro-esophageal reflux disease without esophagitis: Secondary | ICD-10-CM | POA: Diagnosis not present

## 2023-09-08 MED ORDER — PHENTERMINE HCL 37.5 MG PO TABS: 37.5000 mg | ORAL_TABLET | Freq: Every day | ORAL | 0 refills | Status: DC

## 2023-09-08 NOTE — Assessment & Plan Note (Addendum)
Probable eczema vs other, refractory - Papular rash is scattered - Triamcinolone cream Derm ref

## 2023-09-08 NOTE — Assessment & Plan Note (Signed)
Cont on good diet and use Phentermine. Potential benefits of a long term adipex use as well as potential risks  and complications were explained to the patient and were aknowledged.

## 2023-09-08 NOTE — Assessment & Plan Note (Signed)
Pepcid po 

## 2023-09-08 NOTE — Progress Notes (Signed)
Subjective:  Patient ID: Chad Spears, male    DOB: 09/08/1981  Age: 42 y.o. MRN: [id number]  CC: Rash (Skin rash on the arms and leg, been going for over 4 month. )   HPI DIERK MIKAN presents for skin rash - not better F/u on obesity, GERD  Outpatient Medications Prior to Visit  Medication Sig Dispense Refill   clotrimazole-betamethasone (LOTRISONE) cream Apply topically 2 (two) times daily. Use for groin rash 90 g 1   hydrOXYzine (ATARAX) 25 MG tablet Take 1 tablet (25 mg total) by mouth every 6 (six) hours. 30 tablet 0   loratadine (CLARITIN) 10 MG tablet Take 1 tablet (10 mg total) by mouth daily. 90 tablet 3   meloxicam (MOBIC) 15 MG tablet Take 1 tablet (15 mg total) by mouth daily as needed for pain (pc). 90 tablet 1   methocarbamol (ROBAXIN) 500 MG tablet Take 1 tablet at bedtime as needed for back spasm/back painTake 1 tablet at bedtime as needed for back spasm/back pain 90 tablet 1   ondansetron (ZOFRAN-ODT) 4 MG disintegrating tablet Take 1 tablet (4 mg total) by mouth every 8 (eight) hours as needed for nausea or vomiting. 15 tablet 0   ondansetron (ZOFRAN-ODT) 4 MG disintegrating tablet Take 1 tablet (4 mg total) by mouth every 8 (eight) hours as needed. 20 tablet 0   sildenafil (REVATIO) 20 MG tablet TAKE 1 TO 4 TABLETS BY MOUTH ONCE DAILY AS NEEDED 240 tablet 0   Skin Protectants, Misc. (EUCERIN) cream Apply topically as needed for dry skin. 454 g 0   triamcinolone cream (KENALOG) 0.1 % Apply 1 Application topically 3 (three) times daily. On skin rash 80 g 3   Vitamin D, Ergocalciferol, (DRISDOL) 1.25 MG (50000 UNIT) CAPS capsule TAKE 1 CAPSULE BY MOUTH ONCE EVERY MONTH 3 capsule 3   phentermine (ADIPEX-P) 37.5 MG tablet Take 1 tablet (37.5 mg total) by mouth daily before breakfast. 90 tablet 0   No facility-administered medications prior to visit.    ROS: Review of Systems  Constitutional:  Negative for appetite change, fatigue and unexpected weight change.  HENT:   Negative for congestion, nosebleeds, sneezing, sore throat and trouble swallowing.   Eyes:  Negative for itching and visual disturbance.  Respiratory:  Negative for cough.   Cardiovascular:  Negative for chest pain, palpitations and leg swelling.  Gastrointestinal:  Negative for abdominal distention, blood in stool, diarrhea and nausea.  Genitourinary:  Negative for frequency and hematuria.  Musculoskeletal:  Negative for back pain, gait problem, joint swelling and neck pain.  Skin:  Positive for rash.  Neurological:  Negative for dizziness, tremors, speech difficulty and weakness.  Psychiatric/Behavioral:  Negative for agitation, dysphoric mood and sleep disturbance. The patient is not nervous/anxious.     Objective:  BP 110/70   Pulse 85   Temp 97.6 F (36.4 C) (Temporal)   Ht 6' (1.829 m)   Wt 238 lb (108 kg)   SpO2 99%   BMI 32.28 kg/m   BP Readings from Last 3 Encounters:  09/08/23 110/70  09/06/23 111/85  08/21/23 112/60    Wt Readings from Last 3 Encounters:  09/08/23 238 lb (108 kg)  09/06/23 225 lb (102.1 kg)  08/21/23 232 lb (105.2 kg)    Physical Exam Constitutional:      General: He is not in acute distress.    Appearance: He is well-developed. He is obese.     Comments: NAD  Eyes:     Conjunctiva/sclera:  Conjunctivae normal.     Pupils: Pupils are equal, round, and reactive to light.  Neck:     Thyroid: No thyromegaly.     Vascular: No JVD.  Cardiovascular:     Rate and Rhythm: Normal rate and regular rhythm.     Heart sounds: Normal heart sounds. No murmur heard.    No friction rub. No gallop.  Pulmonary:     Effort: Pulmonary effort is normal. No respiratory distress.     Breath sounds: Normal breath sounds. No wheezing or rales.  Chest:     Chest wall: No tenderness.  Abdominal:     General: Bowel sounds are normal. There is no distension.     Palpations: Abdomen is soft. There is no mass.     Tenderness: There is no abdominal tenderness.  There is no guarding or rebound.  Musculoskeletal:        General: No tenderness. Normal range of motion.     Cervical back: Normal range of motion.     Right lower leg: No edema.     Left lower leg: No edema.  Lymphadenopathy:     Cervical: No cervical adenopathy.  Skin:    General: Skin is warm and dry.     Findings: Rash present.  Neurological:     Mental Status: He is alert and oriented to person, place, and time.     Cranial Nerves: No cranial nerve deficit.     Motor: No abnormal muscle tone.     Coordination: Coordination normal.     Gait: Gait normal.     Deep Tendon Reflexes: Reflexes are normal and symmetric.  Psychiatric:        Behavior: Behavior normal.        Thought Content: Thought content normal.        Judgment: Judgment normal.   Papular rash is scattered  Lab Results  Component Value Date   WBC 5.9 09/06/2023   HGB 13.9 09/06/2023   HCT 43.8 09/06/2023   PLT 295 09/06/2023   GLUCOSE 105 (H) 09/06/2023   CHOL 158 03/20/2023   TRIG 52.0 03/20/2023   HDL 56.30 03/20/2023   LDLCALC 91 03/20/2023   ALT 21 09/06/2023   AST 22 09/06/2023   NA 138 09/06/2023   K 3.8 09/06/2023   CL 104 09/06/2023   CREATININE 1.00 09/06/2023   BUN 14 09/06/2023   CO2 26 09/06/2023   TSH 1.97 08/22/2023   PSA 0.46 03/20/2023   INR 1.08 08/23/2010   HGBA1C 5.6 08/22/2023    CT ABDOMEN PELVIS WO CONTRAST  Result Date: 09/06/2023 CLINICAL DATA:  Abdominal pain, acute, nonlocalized EXAM: CT ABDOMEN AND PELVIS WITHOUT CONTRAST TECHNIQUE: Multidetector CT imaging of the abdomen and pelvis was performed following the standard protocol without IV contrast. RADIATION DOSE REDUCTION: This exam was performed according to the departmental dose-optimization program which includes automated exposure control, adjustment of the mA and/or kV according to patient size and/or use of iterative reconstruction technique. COMPARISON:  December 05, 2022, April 25, 2020, September 05, 2011  FINDINGS: Evaluation is limited by lack of IV contrast. Lower chest: No acute abnormality. Hepatobiliary: Unremarkable noncontrast appearance of the liver and gallbladder. Pancreas: No peripancreatic fat stranding. Spleen: Unremarkable. Adrenals/Urinary Tract: Adrenal glands are unremarkable. No hydronephrosis. No obstructing nephrolithiasis. Bladder is unremarkable. Stomach/Bowel: No evidence of bowel obstruction. Appendix is normal. Stomach is unremarkable. Vascular/Lymphatic: Abdominal aorta is normal in course and caliber. Revisualization of multiple coarsely calcified retroperitoneal and pelvic lymph nodes most  consistent with sequela of prior granulomatous infection in this patient with history of remote tuberculosis. Similar appearance of asymmetrically enlarged RIGHT inguinal lymph nodes dating back to at least 2021, likely reactive/sequela of prior infection. Representative RIGHT external iliac lymph node measures 12 mm in the short axis, unchanged since 2021 (series 3, image 81). No new suspicious lymphadenopathy is identified. Reproductive: Prostate is present. Other: No free air or free fluid. Musculoskeletal: No acute or significant osseous findings. Unchanged sclerotic lesion of L5 since at least 2012, consistent with a benign etiology. IMPRESSION: 1. No acute abnormality of the abdomen or pelvis. 2. Similar appearance of asymmetrically enlarged RIGHT inguinal lymph nodes dating back to at least 2021, likely reactive/sequela of prior infection. Electronically Signed   By: Meda Klinefelter M.D.   On: 09/06/2023 12:15    Assessment & Plan:   Problem List Items Addressed This Visit     Esophageal reflux    Pepcid  po      Weight gain    Cont on good diet and use Phentermine. Potential benefits of a long term adipex use as well as potential risks  and complications were explained to the patient and were aknowledged.      Rash and nonspecific skin eruption - Primary    Probable eczema vs  other, refractory - Papular rash is scattered - Triamcinolone cream Derm ref      Relevant Orders   Ambulatory referral to Dermatology      Meds ordered this encounter  Medications   phentermine (ADIPEX-P) 37.5 MG tablet    Sig: Take 1 tablet (37.5 mg total) by mouth daily before breakfast.    Dispense:  90 tablet    Refill:  0      Follow-up: Return in about 3 months (around 12/09/2023) for a follow-up visit.  Sonda Primes, MD

## 2023-09-10 ENCOUNTER — Encounter: Payer: Self-pay | Admitting: Internal Medicine

## 2023-09-29 ENCOUNTER — Telehealth: Payer: Self-pay | Admitting: Internal Medicine

## 2023-10-01 ENCOUNTER — Telehealth: Payer: Self-pay | Admitting: Internal Medicine

## 2023-10-01 NOTE — Telephone Encounter (Signed)
Prescription Request  10/01/2023  LOV: 09/08/2023  What is the name of the medication or equipment? Skin Protectants, Misc. (EUCERIN) cream [   hydrOXYzine (ATARAX) 25 MG tablet  Have you contacted your pharmacy to request a refill? No   Which pharmacy would you like this sent to?  [facility] [address] [place] - [address] [place] [address] Phone: [phone] Fax: [phone]    Patient notified that their request is being sent to the clinical staff for review and that they should receive a response within 2 business days.   Please advise at Mobile [phone] (mobile)

## 2023-10-02 NOTE — Telephone Encounter (Signed)
Pt called back and ask is there anyway possible can he get his prescriptions today.

## 2023-10-03 NOTE — Telephone Encounter (Signed)
Patient called to check on the status of his medications. He would like to know if they can get refilled ASAP. Best callback is [phone].

## 2023-10-05 MED ORDER — HYDROXYZINE HCL 25 MG PO TABS: 60.0000 mg | ORAL_TABLET | Freq: Four times a day (QID) | ORAL | 0 refills | Status: DC

## 2023-10-05 NOTE — Addendum Note (Signed)
Addended by: Tresa Garter on: 10/05/2023 08:27 AM   Modules accepted: Orders

## 2023-10-10 ENCOUNTER — Other Ambulatory Visit: Payer: Self-pay | Admitting: Internal Medicine

## 2023-10-30 ENCOUNTER — Other Ambulatory Visit: Payer: Self-pay | Admitting: Internal Medicine

## 2023-10-31 ENCOUNTER — Telehealth: Payer: Self-pay

## 2023-10-31 NOTE — Telephone Encounter (Signed)
Copied from CRM [id number]. Topic: Clinical - Medication Refill >> Oct 31, 2023 11:51 AM Almira Coaster wrote:    Medication: hydrOXYzine (ATARAX) 25 MG tablet  Has the patient contacted their pharmacy? Yes, no refills left  (Agent: If no, request that the patient contact the pharmacy for the refill. If patient does not wish to contact the pharmacy document the reason why and proceed with request.) (Agent: If yes, when and what did the pharmacy advise?)  Is this the correct pharmacy for this prescription? Yes If no, delete pharmacy and type the correct one.  This is the patient's preferred pharmacy:  [facility] [address] [place] - [address] [place]  [place] [address] Phone: [phone] Fax: [phone]   Has the prescription been filled recently? No  Is the patient out of the medication? Yes  Has the patient been seen for an appointment in the last year OR does the patient have an upcoming appointment? Yes  Can we respond through MyChart? No  Agent: Please be advised that Rx refills may take up to 3 business days. We ask that you follow-up with your pharmacy.

## 2023-11-04 NOTE — Telephone Encounter (Signed)
Done. Thank you.

## 2023-11-20 ENCOUNTER — Other Ambulatory Visit: Payer: Self-pay | Admitting: Internal Medicine

## 2023-11-24 ENCOUNTER — Telehealth: Payer: Self-pay

## 2023-11-24 NOTE — Telephone Encounter (Signed)
 Copied from CRM [id number]. Topic: General - Other >> Nov 24, 2023  2:35 PM Rosina BIRCH wrote:  Reason for CRM: Beverley from [facility] called stating she need to discuss the medication for phentermine  with the provider because she thinks its an duplicate order CB# [phone]

## 2023-11-25 NOTE — Telephone Encounter (Signed)
 Tried calling pharmacy was placed on hold for over 5 min. We have not sent in a rx since October for this medication.

## 2023-11-27 ENCOUNTER — Ambulatory Visit: Payer: BC Managed Care – PPO | Admitting: Internal Medicine

## 2023-12-11 ENCOUNTER — Encounter: Payer: Self-pay | Admitting: Internal Medicine

## 2023-12-11 ENCOUNTER — Ambulatory Visit: Payer: BC Managed Care – PPO | Admitting: Internal Medicine

## 2023-12-11 VITALS — BP 112/70 | HR 73 | Temp 98.2°F | Ht 72.0 in | Wt 230.0 lb

## 2023-12-11 DIAGNOSIS — D170 Benign lipomatous neoplasm of skin and subcutaneous tissue of head, face and neck: Secondary | ICD-10-CM

## 2023-12-11 DIAGNOSIS — M542 Cervicalgia: Secondary | ICD-10-CM

## 2023-12-11 DIAGNOSIS — R221 Localized swelling, mass and lump, neck: Secondary | ICD-10-CM | POA: Diagnosis not present

## 2023-12-11 DIAGNOSIS — E66811 Obesity, class 1: Secondary | ICD-10-CM | POA: Diagnosis not present

## 2023-12-11 DIAGNOSIS — N528 Other male erectile dysfunction: Secondary | ICD-10-CM

## 2023-12-11 MED ORDER — PHENTERMINE HCL 37.5 MG PO TABS: 37.5000 mg | ORAL_TABLET | Freq: Every day | ORAL | 0 refills | Status: DC

## 2023-12-11 NOTE — Assessment & Plan Note (Signed)
On Phentermine  Potential benefits of a long term adipex use as well as potential risks  and complications were explained to the patient and were aknowledged.

## 2023-12-11 NOTE — Progress Notes (Signed)
+  Subjective:  Patient ID: Chad Spears, male    DOB: 21-Jan-1981  Age: 43 y.o. MRN: [id number]  CC: Medical Management of Chronic Issues (Discuss neck surgery)   HPI Chad Spears presents for neck pain, enlarging neck lipoma, F/u on obesity, ED  Outpatient Medications Prior to Visit  Medication Sig Dispense Refill   clotrimazole-betamethasone (LOTRISONE) cream Apply topically 2 (two) times daily. Use for groin rash 90 g 1   Emollient (EQ THERAPEUTIC DRY SKIN) CREA APPLY TOPICALLY AS NEEDED FOR DRY SKIN     hydrOXYzine (ATARAX) 25 MG tablet TAKE 2 & 1/2 (TWO & ONE-HALF) TABLETS BY MOUTH EVERY 6 HOURS 30 tablet 2   loratadine (CLARITIN) 10 MG tablet Take 1 tablet (10 mg total) by mouth daily. 90 tablet 3   meloxicam (MOBIC) 15 MG tablet Take 1 tablet (15 mg total) by mouth daily as needed for pain (pc). 90 tablet 1   methocarbamol (ROBAXIN) 500 MG tablet Take 1 tablet at bedtime as needed for back spasm/back painTake 1 tablet at bedtime as needed for back spasm/back pain 90 tablet 1   ondansetron (ZOFRAN-ODT) 4 MG disintegrating tablet Take 1 tablet (4 mg total) by mouth every 8 (eight) hours as needed for nausea or vomiting. 15 tablet 0   ondansetron (ZOFRAN-ODT) 4 MG disintegrating tablet Take 1 tablet (4 mg total) by mouth every 8 (eight) hours as needed. 20 tablet 0   sildenafil (REVATIO) 20 MG tablet TAKE 1 TO 4 TABLETS BY MOUTH ONCE DAILY AS NEEDED 240 tablet 0   Skin Protectants, Misc. (EUCERIN) cream Apply topically as needed for dry skin. 454 g 0   triamcinolone cream (KENALOG) 0.1 % Apply 1 Application topically 3 (three) times daily. On skin rash 80 g 3   Vitamin D, Ergocalciferol, (DRISDOL) 1.25 MG (50000 UNIT) CAPS capsule TAKE 1 CAPSULE BY MOUTH ONCE EVERY MONTH 3 capsule 3   phentermine (ADIPEX-P) 37.5 MG tablet Take 1 tablet (37.5 mg total) by mouth daily before breakfast. 90 tablet 0   No facility-administered medications prior to visit.    ROS: Review of Systems   Constitutional:  Negative for appetite change, fatigue and unexpected weight change.  HENT:  Negative for congestion, nosebleeds, sneezing, sore throat and trouble swallowing.   Eyes:  Negative for itching and visual disturbance.  Respiratory:  Negative for cough.   Cardiovascular:  Negative for chest pain, palpitations and leg swelling.  Gastrointestinal:  Negative for abdominal distention, blood in stool, diarrhea and nausea.  Genitourinary:  Negative for frequency and hematuria.  Musculoskeletal:  Positive for neck pain. Negative for back pain, gait problem and joint swelling.  Skin:  Negative for rash.  Neurological:  Negative for dizziness, tremors, speech difficulty and weakness.  Psychiatric/Behavioral:  Negative for agitation, dysphoric mood, sleep disturbance and suicidal ideas. The patient is not nervous/anxious.     Objective:  BP 112/70 (BP Location: Left Arm, Patient Position: Sitting, Cuff Size: Normal)   Pulse 73   Temp 98.2 F (36.8 C) (Oral)   Ht 6' (1.829 m)   Wt 230 lb (104.3 kg)   SpO2 98%   BMI 31.19 kg/m   BP Readings from Last 3 Encounters:  12/11/23 112/70  09/08/23 110/70  09/06/23 111/85    Wt Readings from Last 3 Encounters:  12/11/23 230 lb (104.3 kg)  09/08/23 238 lb (108 kg)  09/06/23 225 lb (102.1 kg)    Physical Exam Constitutional:      General: He is not  in acute distress.    Appearance: He is well-developed. He is obese.     Comments: NAD  Eyes:     Conjunctiva/sclera: Conjunctivae normal.     Pupils: Pupils are equal, round, and reactive to light.  Neck:     Thyroid: No thyromegaly.     Vascular: No JVD.  Cardiovascular:     Rate and Rhythm: Normal rate and regular rhythm.     Heart sounds: Normal heart sounds. No murmur heard.    No friction rub. No gallop.  Pulmonary:     Effort: Pulmonary effort is normal. No respiratory distress.     Breath sounds: Normal breath sounds. No wheezing or rales.  Chest:     Chest wall: No  tenderness.  Abdominal:     General: Bowel sounds are normal. There is no distension.     Palpations: Abdomen is soft. There is no mass.     Tenderness: There is no abdominal tenderness. There is no guarding or rebound.  Musculoskeletal:        General: Tenderness present. Normal range of motion.     Cervical back: Normal range of motion.  Lymphadenopathy:     Cervical: No cervical adenopathy.  Skin:    General: Skin is warm and dry.     Findings: No rash.  Neurological:     Mental Status: He is alert and oriented to person, place, and time.     Cranial Nerves: No cranial nerve deficit.     Motor: No abnormal muscle tone.     Coordination: Coordination normal.     Gait: Gait normal.     Deep Tendon Reflexes: Reflexes are normal and symmetric.  Psychiatric:        Behavior: Behavior normal.        Thought Content: Thought content normal.        Judgment: Judgment normal.   Large lipoma on posterior neck base /upper back; tender  Lab Results  Component Value Date   WBC 5.9 09/06/2023   HGB 13.9 09/06/2023   HCT 43.8 09/06/2023   PLT 295 09/06/2023   GLUCOSE 105 (H) 09/06/2023   CHOL 158 03/20/2023   TRIG 52.0 03/20/2023   HDL 56.30 03/20/2023   LDLCALC 91 03/20/2023   ALT 21 09/06/2023   AST 22 09/06/2023   NA 138 09/06/2023   K 3.8 09/06/2023   CL 104 09/06/2023   CREATININE 1.00 09/06/2023   BUN 14 09/06/2023   CO2 26 09/06/2023   TSH 1.97 08/22/2023   PSA 0.46 03/20/2023   INR 1.08 08/23/2010   HGBA1C 5.6 08/22/2023    CT ABDOMEN PELVIS WO CONTRAST Result Date: 09/06/2023 CLINICAL DATA:  Abdominal pain, acute, nonlocalized EXAM: CT ABDOMEN AND PELVIS WITHOUT CONTRAST TECHNIQUE: Multidetector CT imaging of the abdomen and pelvis was performed following the standard protocol without IV contrast. RADIATION DOSE REDUCTION: This exam was performed according to the departmental dose-optimization program which includes automated exposure control, adjustment of the mA  and/or kV according to patient size and/or use of iterative reconstruction technique. COMPARISON:  December 05, 2022, April 25, 2020, September 05, 2011 FINDINGS: Evaluation is limited by lack of IV contrast. Lower chest: No acute abnormality. Hepatobiliary: Unremarkable noncontrast appearance of the liver and gallbladder. Pancreas: No peripancreatic fat stranding. Spleen: Unremarkable. Adrenals/Urinary Tract: Adrenal glands are unremarkable. No hydronephrosis. No obstructing nephrolithiasis. Bladder is unremarkable. Stomach/Bowel: No evidence of bowel obstruction. Appendix is normal. Stomach is unremarkable. Vascular/Lymphatic: Abdominal aorta is normal in course and  caliber. Revisualization of multiple coarsely calcified retroperitoneal and pelvic lymph nodes most consistent with sequela of prior granulomatous infection in this patient with history of remote tuberculosis. Similar appearance of asymmetrically enlarged RIGHT inguinal lymph nodes dating back to at least 2021, likely reactive/sequela of prior infection. Representative RIGHT external iliac lymph node measures 12 mm in the short axis, unchanged since 2021 (series 3, image 81). No new suspicious lymphadenopathy is identified. Reproductive: Prostate is present. Other: No free air or free fluid. Musculoskeletal: No acute or significant osseous findings. Unchanged sclerotic lesion of L5 since at least 2012, consistent with a benign etiology. IMPRESSION: 1. No acute abnormality of the abdomen or pelvis. 2. Similar appearance of asymmetrically enlarged RIGHT inguinal lymph nodes dating back to at least 2021, likely reactive/sequela of prior infection. Electronically Signed   By: Meda Klinefelter M.D.   On: 09/06/2023 12:15    Assessment & Plan:   Problem List Items Addressed This Visit     Neck mass   Will ref to Dr Magnus Ivan  S/p remote surgery  Large lipoma on posterior neck base /upper back relapsed; tender 2025      Relevant Orders    Ambulatory referral to General Surgery   Erectile dysfunction   Cont on Revatio prn      Cervical pain (neck)   Will ref to Dr Magnus Ivan  Large lipoma on posterior neck base /upper back relapsed; tender 2025      Relevant Orders   Ambulatory referral to General Surgery   Obesity (BMI 30.0-34.9)   On Phentermine  Potential benefits of a long term adipex use as well as potential risks  and complications were explained to the patient and were aknowledged.      Lipoma of neck - Primary   Will ref to Dr Magnus Ivan  S/p remote surgery  Large lipoma on posterior neck base /upper back relapsed; tender 2025      Relevant Orders   Ambulatory referral to General Surgery      Meds ordered this encounter  Medications   phentermine (ADIPEX-P) 37.5 MG tablet    Sig: Take 1 tablet (37.5 mg total) by mouth daily before breakfast.    Dispense:  90 tablet    Refill:  0      Follow-up: Return in about 3 months (around 03/10/2024) for a follow-up visit.  Sonda Primes, MD

## 2023-12-11 NOTE — Assessment & Plan Note (Addendum)
Will ref to Dr Magnus Ivan  S/p remote surgery  Large lipoma on posterior neck base /upper back relapsed; tender 2025

## 2023-12-11 NOTE — Assessment & Plan Note (Signed)
Will ref to Dr Magnus Ivan  S/p remote surgery  Large lipoma on posterior neck base /upper back relapsed; tender 2025

## 2023-12-11 NOTE — Assessment & Plan Note (Signed)
Cont on Revatio prn

## 2023-12-11 NOTE — Assessment & Plan Note (Addendum)
Ref to Dr Magnus Ivan  S/p remote surgery  Large lipoma on posterior neck base /upper back relapsed; tender

## 2023-12-11 NOTE — Assessment & Plan Note (Signed)
Will ref to Dr Magnus Ivan  Large lipoma on posterior neck base /upper back relapsed; tender 2025

## 2023-12-24 ENCOUNTER — Telehealth: Payer: Self-pay | Admitting: Internal Medicine

## 2023-12-24 NOTE — Telephone Encounter (Unsigned)
Copied from CRM [id number]. Topic: Clinical - Prescription Issue >> Dec 24, 2023 11:08 AM Truddie Crumble wrote: Reason for CRM: patient called stating he does not want the sildenafil anymore but the pavalif 100mg 

## 2023-12-25 ENCOUNTER — Other Ambulatory Visit: Payer: Self-pay | Admitting: Internal Medicine

## 2023-12-25 NOTE — Telephone Encounter (Signed)
Copied from CRM [id number]. Topic: Clinical - Medication Refill >> Dec 25, 2023 12:36 PM Gurney Maxin H wrote: Most Recent Primary Care Visit:  Provider: Tresa Garter  Department: [facility]  Visit Type: OFFICE VISIT  Date: 12/11/2023  Medication: sildenafil (REVATIO) 20 MG tablet   Has the patient contacted their pharmacy? Yes (Agent: If no, request that the patient contact the pharmacy for the refill. If patient does not wish to contact the pharmacy document the reason why and proceed with request.) (Agent: If yes, when and what did the pharmacy advise?)  Is this the correct pharmacy for this prescription? Yes If no, delete pharmacy and type the correct one.  This is the patient's preferred pharmacy:  [facility] [address], [place] - [address] [place] [address] Phone: [phone] Fax: [phone]   Has the prescription been filled recently? No  Is the patient out of the medication? Yes  Has the patient been seen for an appointment in the last year OR does the patient have an upcoming appointment? Yes  Can we respond through MyChart? No  Agent: Please be advised that Rx refills may take up to 3 business days. We ask that you follow-up with your pharmacy.

## 2023-12-25 NOTE — Telephone Encounter (Signed)
Tried calling the pt to inform him that Dr. Posey Rea has stated "I'm not  familiar w/this Rx name."

## 2023-12-25 NOTE — Telephone Encounter (Signed)
I'm not  familiar w/this Rx name Thx

## 2023-12-30 MED ORDER — SILDENAFIL CITRATE 20 MG PO TABS: ORAL_TABLET | ORAL | 0 refills | Status: DC

## 2024-01-09 ENCOUNTER — Other Ambulatory Visit: Payer: Self-pay | Admitting: Surgery

## 2024-01-13 ENCOUNTER — Ambulatory Visit: Payer: Self-pay | Admitting: Internal Medicine

## 2024-01-13 NOTE — Telephone Encounter (Signed)
 Copied from CRM [id number]. Topic: Clinical - Red Word Triage >> Jan 13, 2024 10:52 AM Prudencio Pair wrote: Red Word that prompted transfer to Nurse Triage: Patient states both sides of both arms are broken out in a rash. States his arms are itchy & burning. Patient states this has been happening for about 9 months but it comes & goes. Would like to see if provider if possible.    Chief Complaint: Rash Symptoms: Rash on bilateral arms Frequency: Intermittent for the past year Pertinent Negatives: Patient denies fever Disposition: [] ED /[] Urgent Care (no appt availability in office) / [x] Appointment(In office/virtual)/ []  [facility]/ [] Home Care/ [] Refused Recommended Disposition /[] [facility]/ []  Follow-up with PCP  Additional Notes: Patient stated he has been having a rash on both arms for the past year. It comes and goes. It reoccurs on a monthly basis. He stated he was seen for this before and was given a medication that helped it go away, but then it came back. Next available appt scheduled for 3/13 with PCP.   Answer Assessment - Initial Assessment Questions 1. APPEARANCE of RASH: "Describe the rash."      Bumpy with a burning sensation  2. LOCATION: "Where is the rash located?"      Both arms  3. NUMBER: "How many spots are there?"      A lot   4. ONSET: "When did the rash start?"      Started last year. It goes away and reoccurs on a monthly basis  5. ITCHING: "Does the rash itch?" If Yes, ask: "How bad is the itch?"  (Scale 0-10; or none, mild, moderate, severe)     Mild itching  6. PAIN: "Does the rash hurt?" If Yes, ask: "How bad is the pain?"  (Scale 0-10; or none, mild, moderate, severe)    - NONE (0): no pain    - MILD (1-3): doesn't interfere with normal activities     - MODERATE (4-7): interferes with normal activities or awakens from sleep     - SEVERE (8-10): excruciating pain, unable to do any normal activities     Mild pain  6. OTHER  SYMPTOMS: "Do you have any other symptoms?" (e.g., fever)     No  Protocols used: Rash or Redness - Localized-A-AH

## 2024-01-21 ENCOUNTER — Other Ambulatory Visit: Payer: Self-pay | Admitting: Internal Medicine

## 2024-01-22 ENCOUNTER — Encounter: Payer: Self-pay | Admitting: Internal Medicine

## 2024-01-22 ENCOUNTER — Ambulatory Visit: Admitting: Internal Medicine

## 2024-01-22 VITALS — BP 102/72 | HR 79 | Temp 98.3°F | Ht 72.0 in | Wt 232.0 lb

## 2024-01-22 DIAGNOSIS — R21 Rash and other nonspecific skin eruption: Secondary | ICD-10-CM

## 2024-01-22 DIAGNOSIS — M545 Low back pain, unspecified: Secondary | ICD-10-CM

## 2024-01-22 DIAGNOSIS — G8929 Other chronic pain: Secondary | ICD-10-CM | POA: Diagnosis not present

## 2024-01-22 DIAGNOSIS — R635 Abnormal weight gain: Secondary | ICD-10-CM

## 2024-01-22 MED ORDER — HYDROXYZINE PAMOATE 25 MG PO CAPS: 25.0000 mg | ORAL_CAPSULE | Freq: Every evening | ORAL | 2 refills | Status: DC | PRN

## 2024-01-22 MED ORDER — METHYLPREDNISOLONE 4 MG PO TBPK: ORAL_TABLET | ORAL | 0 refills | Status: DC

## 2024-01-22 MED ORDER — METHYLPREDNISOLONE ACETATE 80 MG/ML IJ SUSP
80.0000 mg | Freq: Once | INTRAMUSCULAR | Status: AC
  Administered 2024-01-22: 80 mg via INTRAMUSCULAR

## 2024-01-22 MED ORDER — TRIAMCINOLONE ACETONIDE 0.1 % EX CREA: 1.0000 | TOPICAL_CREAM | Freq: Three times a day (TID) | CUTANEOUS | 3 refills | Status: DC

## 2024-01-22 NOTE — Progress Notes (Signed)
 Subjective:  Patient ID: Chad Spears, male    DOB: 03-15-1981  Age: 43 y.o. MRN: [id number]  CC: Rash            HPI RATHANA VIVEROS presents for rash - worse    Outpatient Medications Prior to Visit  Medication Sig Dispense Refill   clotrimazole -betamethasone  (LOTRISONE ) cream Apply topically 2 (two) times daily. Use for groin rash 90 g 1   Emollient (EQ THERAPEUTIC DRY SKIN) CREA APPLY TOPICALLY AS NEEDED FOR DRY SKIN     loratadine  (CLARITIN ) 10 MG tablet Take 1 tablet (10 mg total) by mouth daily. 90 tablet 3   methocarbamol  (ROBAXIN ) 500 MG tablet Take 1 tablet at bedtime as needed for back spasm/back painTake 1 tablet at bedtime as needed for back spasm/back pain 90 tablet 1   phentermine  (ADIPEX-P ) 37.5 MG tablet Take 1 tablet (37.5 mg total) by mouth daily before breakfast. 90 tablet 0   sildenafil  (REVATIO ) 20 MG tablet TAKE 1 TO 4 TABLETS BY MOUTH ONCE DAILY AS NEEDED 240 tablet 0   Skin Protectants, Misc. (EUCERIN) cream Apply topically as needed for dry skin. 454 g 0   Vitamin D , Ergocalciferol , (DRISDOL ) 1.25 MG (50000 UNIT) CAPS capsule TAKE 1 CAPSULE BY MOUTH ONCE EVERY MONTH 3 capsule 3   hydrOXYzine  (ATARAX ) 25 MG tablet TAKE 2 & 1/2 (TWO & ONE-HALF) TABLETS BY MOUTH EVERY 6 HOURS 30 tablet 0   meloxicam  (MOBIC ) 15 MG tablet Take 1 tablet (15 mg total) by mouth daily as needed for pain (pc). 90 tablet 1   ondansetron  (ZOFRAN -ODT) 4 MG disintegrating tablet Take 1 tablet (4 mg total) by mouth every 8 (eight) hours as needed for nausea or vomiting. 15 tablet 0   ondansetron  (ZOFRAN -ODT) 4 MG disintegrating tablet Take 1 tablet (4 mg total) by mouth every 8 (eight) hours as needed. 20 tablet 0   triamcinolone  cream (KENALOG ) 0.1 % Apply 1 Application topically 3 (three) times daily. On skin rash 80 g 3   No facility-administered medications prior to visit.    ROS: Review of Systems  Constitutional:  Positive for unexpected weight change. Negative for appetite  change and fatigue.  HENT:  Negative for congestion, nosebleeds, sneezing, sore throat and trouble swallowing.   Eyes:  Negative for itching and visual disturbance.  Respiratory:  Negative for cough.   Cardiovascular:  Negative for chest pain, palpitations and leg swelling.  Gastrointestinal:  Negative for abdominal distention, blood in stool, diarrhea and nausea.  Genitourinary:  Negative for frequency and hematuria.  Musculoskeletal:  Positive for back pain. Negative for arthralgias, gait problem, joint swelling and neck pain.  Skin:  Positive for color change and rash.  Neurological:  Negative for dizziness, tremors, speech difficulty and weakness.  Psychiatric/Behavioral:  Negative for agitation, dysphoric mood, sleep disturbance and suicidal ideas. The patient is not nervous/anxious.     Objective:  BP 102/72   Pulse 79   Temp 98.3 F (36.8 C) (Oral)   Ht 6' (1.829 m)   Wt 232 lb (105.2 kg)   SpO2 98%   BMI 31.46 kg/m   BP Readings from Last 3 Encounters:  02/19/24 119/85  01/22/24 102/72  12/11/23 112/70    Wt Readings from Last 3 Encounters:  02/19/24 226 lb 6.6 oz (102.7 kg)  01/22/24 232 lb (105.2 kg)  12/11/23 230 lb (104.3 kg)    Physical Exam Constitutional:      General: He is not in acute distress.  Appearance: He is well-developed. He is obese.     Comments: NAD  Eyes:     Conjunctiva/sclera: Conjunctivae normal.     Pupils: Pupils are equal, round, and reactive to light.  Neck:     Thyroid : No thyromegaly.     Vascular: No JVD.  Cardiovascular:     Rate and Rhythm: Normal rate and regular rhythm.     Heart sounds: Normal heart sounds. No murmur heard.    No friction rub. No gallop.  Pulmonary:     Effort: Pulmonary effort is normal. No respiratory distress.     Breath sounds: Normal breath sounds. No wheezing or rales.  Chest:     Chest wall: No tenderness.  Abdominal:     General: Bowel sounds are normal. There is no distension.      Palpations: Abdomen is soft. There is no mass.     Tenderness: There is no abdominal tenderness. There is no guarding or rebound.  Musculoskeletal:        General: No tenderness. Normal range of motion.     Cervical back: Normal range of motion.  Lymphadenopathy:     Cervical: No cervical adenopathy.  Skin:    General: Skin is warm and dry.     Findings: Rash present.  Neurological:     Mental Status: He is alert and oriented to person, place, and time.     Cranial Nerves: No cranial nerve deficit.     Motor: No abnormal muscle tone.     Coordination: Coordination normal.     Gait: Gait normal.     Deep Tendon Reflexes: Reflexes are normal and symmetric.  Psychiatric:        Behavior: Behavior normal.        Thought Content: Thought content normal.        Judgment: Judgment normal.    Rash-as depicted: scaly papules of different size, primarily underarms, on the trunk, extremities, neck   Lab Results  Component Value Date   WBC 5.9 09/06/2023   HGB 13.9 09/06/2023   HCT 43.8 09/06/2023   PLT 295 09/06/2023   GLUCOSE 105 (H) 09/06/2023   CHOL 158 03/20/2023   TRIG 52.0 03/20/2023   HDL 56.30 03/20/2023   LDLCALC 91 03/20/2023   ALT 21 09/06/2023   AST 22 09/06/2023   NA 138 09/06/2023   K 3.8 09/06/2023   CL 104 09/06/2023   CREATININE 1.00 09/06/2023   BUN 14 09/06/2023   CO2 26 09/06/2023   TSH 1.97 08/22/2023   PSA 0.46 03/20/2023   INR 1.08 08/23/2010   HGBA1C 5.6 08/22/2023    CT ABDOMEN PELVIS WO CONTRAST Result Date: 09/06/2023 CLINICAL DATA:  Abdominal pain, acute, nonlocalized EXAM: CT ABDOMEN AND PELVIS WITHOUT CONTRAST TECHNIQUE: Multidetector CT imaging of the abdomen and pelvis was performed following the standard protocol without IV contrast. RADIATION DOSE REDUCTION: This exam was performed according to the departmental dose-optimization program which includes automated exposure control, adjustment of the mA and/or kV according to patient size and/or  use of iterative reconstruction technique. COMPARISON:  December 05, 2022, April 25, 2020, September 05, 2011 FINDINGS: Evaluation is limited by lack of IV contrast. Lower chest: No acute abnormality. Hepatobiliary: Unremarkable noncontrast appearance of the liver and gallbladder. Pancreas: No peripancreatic fat stranding. Spleen: Unremarkable. Adrenals/Urinary Tract: Adrenal glands are unremarkable. No hydronephrosis. No obstructing nephrolithiasis. Bladder is unremarkable. Stomach/Bowel: No evidence of bowel obstruction. Appendix is normal. Stomach is unremarkable. Vascular/Lymphatic: Abdominal aorta is normal in course  and caliber. Revisualization of multiple coarsely calcified retroperitoneal and pelvic lymph nodes most consistent with sequela of prior granulomatous infection in this patient with history of remote tuberculosis. Similar appearance of asymmetrically enlarged RIGHT inguinal lymph nodes dating back to at least 2021, likely reactive/sequela of prior infection. Representative RIGHT external iliac lymph node measures 12 mm in the short axis, unchanged since 2021 (series 3, image 81). No new suspicious lymphadenopathy is identified. Reproductive: Prostate is present. Other: No free air or free fluid. Musculoskeletal: No acute or significant osseous findings. Unchanged sclerotic lesion of L5 since at least 2012, consistent with a benign etiology. IMPRESSION: 1. No acute abnormality of the abdomen or pelvis. 2. Similar appearance of asymmetrically enlarged RIGHT inguinal lymph nodes dating back to at least 2021, likely reactive/sequela of prior infection. Electronically Signed   By: Clancy Crimes M.D.   On: 09/06/2023 12:15    Assessment & Plan:   Problem List Items Addressed This Visit     Weight gain   Diet discussed.  Cont on good diet and use Phentermine . Potential benefits of a long term adipex use as well as potential risks  and complications were explained to the patient and were  aknowledged.      Low back pain    Working 2 jobs 7 d a week Meloxicam  as needed Flexeril  as needed      Rash and nonspecific skin eruption - Primary   He has not been able to see a dermatologist yet Probable eczema vs other, refractory -  - Triamcinolone  cream Given Depo-Medrol  injection, topical creams-see prescription Reschedule with another dermatology office.  I will perform skin biopsy if there is another delay       Relevant Orders   Ambulatory referral to Dermatology      Meds ordered this encounter  Medications   DISCONTD: methylPREDNISolone  (MEDROL  DOSEPAK) 4 MG TBPK tablet    Sig: As directed    Dispense:  21 tablet    Refill:  0   hydrOXYzine  (VISTARIL ) 25 MG capsule    Sig: Take 1-2 capsules (25-50 mg total) by mouth at bedtime as needed for itching.    Dispense:  60 capsule    Refill:  2   triamcinolone  cream (KENALOG ) 0.1 %    Sig: Apply 1 Application topically 3 (three) times daily. On skin rash    Dispense:  360 g    Refill:  3   methylPREDNISolone  acetate (DEPO-MEDROL ) injection 80 mg      Follow-up: Return in about 4 weeks (around 02/19/2024) for a follow-up visit.  Anitra Barn, MD

## 2024-01-22 NOTE — Assessment & Plan Note (Addendum)
 He has not been able to see a dermatologist yet Probable eczema vs other, refractory -  - Triamcinolone  cream Given Depo-Medrol  injection, topical creams-see prescription Reschedule with another dermatology office.  I will perform skin biopsy if there is another delay

## 2024-01-30 ENCOUNTER — Other Ambulatory Visit: Payer: Self-pay | Admitting: Internal Medicine

## 2024-02-02 ENCOUNTER — Telehealth: Payer: Self-pay

## 2024-02-02 NOTE — Telephone Encounter (Signed)
 Copied from CRM [id number]. Topic: General - Other >> Feb 02, 2024  4:30 PM Eunice Blase wrote: Reason for CRM: Pt returning call from Fox, CMA, pt stated, yes still has rash but not has bad. Pt is requesting medication refill. Please call pt at [phone]

## 2024-02-02 NOTE — Telephone Encounter (Signed)
 Attempted to call patient to ask them if they were still experiencing the rash they were seen for on 03/13? Patient had requested a refill on their prednisone and wanted to see what the status was before sending it back to Dr.Plotnikov.

## 2024-02-05 MED ORDER — METHYLPREDNISOLONE 4 MG PO TBPK: ORAL_TABLET | ORAL | 0 refills | Status: DC

## 2024-02-05 NOTE — Addendum Note (Signed)
 Addended by: Tresa Garter on: 02/05/2024 07:44 AM   Modules accepted: Orders

## 2024-02-05 NOTE — Telephone Encounter (Signed)
 Done.  Can he come tomorrow at 8:00 for a skin biopsy?  We need to do a skin biopsy to determine the diagnosis, unless he has an appointment with dermatology already.  Thank you

## 2024-02-11 ENCOUNTER — Encounter (HOSPITAL_BASED_OUTPATIENT_CLINIC_OR_DEPARTMENT_OTHER): Payer: Self-pay | Admitting: Surgery

## 2024-02-18 NOTE — H&P (Signed)
  REFERRING PHYSICIAN: Plotnikov, Georgina Quint, MD PROVIDER: Wayne Both, MD MRN: [id number] DOB: 09-Jan-1981 Subjective   Chief Complaint: New Patient (Lipoma neck )  History of Present Illness: Chad Spears is a 43 y.o. male who is seen  as an office consultation for evaluation of New Patient (Lipoma neck )  This is a 43 year old gentleman who presents with a recurrent posterior neck mass. He had a excision of a 5 cm posterior neck mass back in 2012. The mass started recurring several years ago and has gotten much larger. He recently had a CT scan of his neck and it showed the mass to measure 8.7 x 4.6 x 5.0 cm in the subcutaneous tissue. He reports that is causing discomfort and tension in his neck and occasional headaches given the size. He is otherwise without complaints  Review of Systems: A complete review of systems was obtained from the patient. I have reviewed this information and discussed as appropriate with the patient. See HPI as well for other ROS.  ROS   Medical History: Past Medical History:  Diagnosis Date  Anxiety   There is no problem list on file for this patient.  Past Surgical History:  Procedure Laterality Date  TONSILLECTOMY    Allergies  Allergen Reactions  Amitiza [Lubiprostone] Nausea  Iodinated Contrast Media Other (See Comments)   Current Outpatient Medications on File Prior to Visit  Medication Sig Dispense Refill  ergocalciferol, vitamin D2, 1,250 mcg (50,000 unit) capsule Take 50,000 Units by mouth once a week  loratadine (CLARITIN) 10 mg capsule Take 10 mg by mouth once daily  methocarbamoL (ROBAXIN) 500 MG tablet Take 500 mg by mouth 4 (four) times daily   No current facility-administered medications on file prior to visit.   History reviewed. No pertinent family history.   Social History   Tobacco Use  Smoking Status Never  Smokeless Tobacco Never    Social History   Socioeconomic History  Marital status: Married  Tobacco  Use  Smoking status: Never  Smokeless tobacco: Never  Substance and Sexual Activity  Alcohol use: Never  Drug use: Never   Objective:   Vitals:   BP: 120/70  Pulse: 88  Temp: 36.6 C (97.8 F)  SpO2: 98%  Weight: (!) 106.1 kg (233 lb 12.8 oz)  Height: 182.9 cm (6')  PainSc: 0-No pain   Body mass index is 31.71 kg/m.  Physical Exam   He appears well on exam  There is a large mass on his posterior neck near the upper back measuring almost 9 cm. It seems soft and mobile and nonpulsatile. There are no skin changes.  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 As it is symptomatic, surgical excision is recommended and he is eager to proceed with surgery given his symptoms. This would also allow histologic evaluation to rule out some type of malignancy. We discussed the risks which includes to bleeding, infection, injury to surrounding structures, seroma formation,, cardiopulmonary issues, postoperative recovery, etc. He understands and wished to proceed with surgery which will be scheduled

## 2024-02-19 ENCOUNTER — Ambulatory Visit (HOSPITAL_BASED_OUTPATIENT_CLINIC_OR_DEPARTMENT_OTHER)
Admission: RE | Admit: 2024-02-19 | Discharge: 2024-02-19 | Disposition: A | Discharge status: Home / Self Care | Attending: Surgery | Admitting: Surgery

## 2024-02-19 ENCOUNTER — Ambulatory Visit (HOSPITAL_BASED_OUTPATIENT_CLINIC_OR_DEPARTMENT_OTHER): Admitting: Certified Registered"

## 2024-02-19 ENCOUNTER — Encounter (HOSPITAL_BASED_OUTPATIENT_CLINIC_OR_DEPARTMENT_OTHER)
Admission: RE | Disposition: A | Discharge status: Home / Self Care | Payer: Self-pay | Source: Home / Self Care | Attending: Surgery

## 2024-02-19 ENCOUNTER — Encounter (HOSPITAL_BASED_OUTPATIENT_CLINIC_OR_DEPARTMENT_OTHER): Payer: Self-pay | Admitting: Surgery

## 2024-02-19 ENCOUNTER — Other Ambulatory Visit: Payer: Self-pay

## 2024-02-19 DIAGNOSIS — F419 Anxiety disorder, unspecified: Secondary | ICD-10-CM | POA: Diagnosis not present

## 2024-02-19 DIAGNOSIS — Z8619 Personal history of other infectious and parasitic diseases: Secondary | ICD-10-CM | POA: Diagnosis not present

## 2024-02-19 DIAGNOSIS — R221 Localized swelling, mass and lump, neck: Secondary | ICD-10-CM | POA: Diagnosis present

## 2024-02-19 DIAGNOSIS — D170 Benign lipomatous neoplasm of skin and subcutaneous tissue of head, face and neck: Secondary | ICD-10-CM | POA: Diagnosis not present

## 2024-02-19 DIAGNOSIS — Z01818 Encounter for other preprocedural examination: Secondary | ICD-10-CM

## 2024-02-19 HISTORY — PX: EXCISION MASS NECK: SHX6703

## 2024-02-19 SURGERY — EXCISION, MASS, NECK
Anesthesia: General | Site: Neck

## 2024-02-19 MED ORDER — ACETAMINOPHEN 500 MG PO TABS
ORAL_TABLET | ORAL | Status: AC
  Filled 2024-02-19: qty 2

## 2024-02-19 MED ORDER — LIDOCAINE 2% (20 MG/ML) 5 ML SYRINGE
INTRAMUSCULAR | Status: AC
  Filled 2024-02-19: qty 5

## 2024-02-19 MED ORDER — DEXAMETHASONE SODIUM PHOSPHATE 10 MG/ML IJ SOLN
INTRAMUSCULAR | Status: DC | PRN
  Administered 2024-02-19 (×2): 5 mg via INTRAVENOUS

## 2024-02-19 MED ORDER — BUPIVACAINE-EPINEPHRINE (PF) 0.5% -1:200000 IJ SOLN
INTRAMUSCULAR | Status: AC
  Filled 2024-02-19: qty 90

## 2024-02-19 MED ORDER — LIDOCAINE 2% (20 MG/ML) 5 ML SYRINGE
INTRAMUSCULAR | Status: DC | PRN
  Administered 2024-02-19: 60 mg via INTRAVENOUS

## 2024-02-19 MED ORDER — PROPOFOL 10 MG/ML IV BOLUS
INTRAVENOUS | Status: AC
  Filled 2024-02-19: qty 20

## 2024-02-19 MED ORDER — SUGAMMADEX SODIUM 200 MG/2ML IV SOLN
INTRAVENOUS | Status: DC | PRN
  Administered 2024-02-19: 300 mg via INTRAVENOUS

## 2024-02-19 MED ORDER — CHLORHEXIDINE GLUCONATE CLOTH 2 % EX PADS
6.0000 | MEDICATED_PAD | Freq: Once | CUTANEOUS | Status: DC
Start: 2024-02-19 — End: 2024-02-19

## 2024-02-19 MED ORDER — ACETAMINOPHEN 500 MG PO TABS
1000.0000 mg | ORAL_TABLET | ORAL | Status: AC
  Administered 2024-02-19: 1000 mg via ORAL

## 2024-02-19 MED ORDER — FENTANYL CITRATE (PF) 100 MCG/2ML IJ SOLN
INTRAMUSCULAR | Status: DC | PRN
  Administered 2024-02-19: 50 ug via INTRAVENOUS
  Administered 2024-02-19: 100 ug via INTRAVENOUS

## 2024-02-19 MED ORDER — CEFAZOLIN SODIUM-DEXTROSE 2-4 GM/100ML-% IV SOLN
2.0000 g | INTRAVENOUS | Status: AC
  Administered 2024-02-19: 2 g via INTRAVENOUS

## 2024-02-19 MED ORDER — ROCURONIUM BROMIDE 10 MG/ML (PF) SYRINGE
PREFILLED_SYRINGE | INTRAVENOUS | Status: AC
  Filled 2024-02-19: qty 10

## 2024-02-19 MED ORDER — ONDANSETRON HCL 4 MG/2ML IJ SOLN
INTRAMUSCULAR | Status: AC
  Filled 2024-02-19: qty 2

## 2024-02-19 MED ORDER — OXYCODONE HCL 5 MG PO TABS: 5.0000 mg | ORAL_TABLET | Freq: Four times a day (QID) | ORAL | 0 refills | Status: AC | PRN

## 2024-02-19 MED ORDER — OXYCODONE HCL 5 MG PO TABS: 5.0000 mg | ORAL_TABLET | Freq: Once | ORAL | Status: DC | PRN

## 2024-02-19 MED ORDER — ONDANSETRON HCL 4 MG/2ML IJ SOLN
INTRAMUSCULAR | Status: DC | PRN
  Administered 2024-02-19: 4 mg via INTRAVENOUS

## 2024-02-19 MED ORDER — MIDAZOLAM HCL 2 MG/2ML IJ SOLN
INTRAMUSCULAR | Status: DC | PRN
  Administered 2024-02-19: 2 mg via INTRAVENOUS

## 2024-02-19 MED ORDER — AMISULPRIDE (ANTIEMETIC) 5 MG/2ML IV SOLN: 10.0000 mg | Freq: Once | INTRAVENOUS | Status: DC | PRN

## 2024-02-19 MED ORDER — DEXAMETHASONE SODIUM PHOSPHATE 10 MG/ML IJ SOLN
INTRAMUSCULAR | Status: AC
  Filled 2024-02-19: qty 1

## 2024-02-19 MED ORDER — PROPOFOL 10 MG/ML IV BOLUS
INTRAVENOUS | Status: DC | PRN
  Administered 2024-02-19: 200 mg via INTRAVENOUS

## 2024-02-19 MED ORDER — FENTANYL CITRATE (PF) 100 MCG/2ML IJ SOLN: 25.0000 ug | INTRAMUSCULAR | Status: DC | PRN

## 2024-02-19 MED ORDER — MIDAZOLAM HCL 2 MG/2ML IJ SOLN
INTRAMUSCULAR | Status: AC
  Filled 2024-02-19: qty 2

## 2024-02-19 MED ORDER — TRANEXAMIC ACID 1000 MG/10ML IV SOLN
INTRAVENOUS | Status: AC
  Filled 2024-02-19: qty 30

## 2024-02-19 MED ORDER — OXYCODONE HCL 5 MG/5ML PO SOLN: 5.0000 mg | Freq: Once | ORAL | Status: DC | PRN

## 2024-02-19 MED ORDER — PROPOFOL 10 MG/ML IV BOLUS
INTRAVENOUS | Status: AC
Start: 2024-02-19 — End: ?
  Filled 2024-02-19: qty 20

## 2024-02-19 MED ORDER — KETOROLAC TROMETHAMINE 30 MG/ML IJ SOLN: 30.0000 mg | Freq: Once | INTRAMUSCULAR | Status: DC | PRN

## 2024-02-19 MED ORDER — CEFAZOLIN SODIUM-DEXTROSE 2-4 GM/100ML-% IV SOLN
INTRAVENOUS | Status: AC
  Filled 2024-02-19: qty 100

## 2024-02-19 MED ORDER — ROCURONIUM BROMIDE 10 MG/ML (PF) SYRINGE
PREFILLED_SYRINGE | INTRAVENOUS | Status: DC | PRN
  Administered 2024-02-19: 30 mg via INTRAVENOUS

## 2024-02-19 MED ORDER — FENTANYL CITRATE (PF) 100 MCG/2ML IJ SOLN
INTRAMUSCULAR | Status: AC
  Filled 2024-02-19: qty 2

## 2024-02-19 MED ORDER — BUPIVACAINE-EPINEPHRINE 0.5% -1:200000 IJ SOLN
INTRAMUSCULAR | Status: DC | PRN
  Administered 2024-02-19: 10 mL

## 2024-02-19 MED ORDER — LACTATED RINGERS IV SOLN: INTRAVENOUS | Status: DC

## 2024-02-19 SURGICAL SUPPLY — 35 items
BLADE CLIPPER SURG (BLADE) IMPLANT
BLADE SURG 15 STRL LF DISP TIS (BLADE) ×1 IMPLANT
BNDG ELASTIC 3INX 5YD STR LF (GAUZE/BANDAGES/DRESSINGS) IMPLANT
BNDG ELASTIC 4INX 5YD STR LF (GAUZE/BANDAGES/DRESSINGS) IMPLANT
CANISTER SUCT 1200ML W/VALVE (MISCELLANEOUS) IMPLANT
CHLORAPREP W/TINT 26 (MISCELLANEOUS) ×1 IMPLANT
COVER BACK TABLE 60X90IN (DRAPES) ×1 IMPLANT
COVER MAYO STAND STRL (DRAPES) ×1 IMPLANT
DERMABOND ADVANCED .7 DNX12 (GAUZE/BANDAGES/DRESSINGS) ×1 IMPLANT
DRAPE LAPAROTOMY 100X72 PEDS (DRAPES) IMPLANT
DRAPE U-SHAPE 76X120 STRL (DRAPES) IMPLANT
DRAPE UTILITY XL STRL (DRAPES) ×1 IMPLANT
ELECT REM PT RETURN 9FT ADLT (ELECTROSURGICAL) ×1 IMPLANT
ELECTRODE REM PT RTRN 9FT ADLT (ELECTROSURGICAL) ×1 IMPLANT
GLOVE BIOGEL PI IND STRL 7.0 (GLOVE) IMPLANT
GLOVE BIOGEL PI IND STRL 7.5 (GLOVE) IMPLANT
GLOVE SURG SIGNA 7.5 PF LTX (GLOVE) ×1 IMPLANT
GOWN STRL REUS W/ TWL LRG LVL3 (GOWN DISPOSABLE) ×1 IMPLANT
GOWN STRL REUS W/ TWL XL LVL3 (GOWN DISPOSABLE) ×1 IMPLANT
NDL HYPO 25X1 1.5 SAFETY (NEEDLE) ×1 IMPLANT
NEEDLE HYPO 25X1 1.5 SAFETY (NEEDLE) ×1 IMPLANT
NS IRRIG 1000ML POUR BTL (IV SOLUTION) IMPLANT
PACK BASIN DAY SURGERY FS (CUSTOM PROCEDURE TRAY) ×1 IMPLANT
PENCIL SMOKE EVACUATOR (MISCELLANEOUS) ×1 IMPLANT
SLEEVE SCD COMPRESS KNEE MED (STOCKING) IMPLANT
SPIKE FLUID TRANSFER (MISCELLANEOUS) IMPLANT
SPONGE T-LAP 4X18 ~~LOC~~+RFID (SPONGE) ×1 IMPLANT
SUT MNCRL AB 4-0 PS2 18 (SUTURE) IMPLANT
SUT VIC AB 2-0 SH 27XBRD (SUTURE) IMPLANT
SUT VIC AB 3-0 SH 27X BRD (SUTURE) IMPLANT
SYR BULB EAR ULCER 3OZ GRN STR (SYRINGE) IMPLANT
SYR CONTROL 10ML LL (SYRINGE) ×1 IMPLANT
TOWEL GREEN STERILE FF (TOWEL DISPOSABLE) ×1 IMPLANT
TUBE CONNECTING 20X1/4 (TUBING) IMPLANT
YANKAUER SUCT BULB TIP NO VENT (SUCTIONS) IMPLANT

## 2024-02-19 NOTE — Anesthesia Procedure Notes (Signed)
 Procedure Name: Intubation Date/Time: 02/19/2024 7:37 AM  Performed by: Francie Massing, CRNAPre-anesthesia Checklist: Patient identified, Emergency Drugs available, Suction available and Patient being monitored Patient Re-evaluated:Patient Re-evaluated prior to induction Oxygen Delivery Method: Circle system utilized Preoxygenation: Pre-oxygenation with 100% oxygen Induction Type: IV induction Ventilation: Mask ventilation without difficulty Laryngoscope Size: Mac and 4 Grade View: Grade I Tube type: Oral Tube size: 7.0 mm Number of attempts: 1 Airway Equipment and Method: Stylet and Oral airway Placement Confirmation: ETT inserted through vocal cords under direct vision, positive ETCO2 and breath sounds checked- equal and bilateral Secured at: 22 cm Tube secured with: Tape Dental Injury: Teeth and Oropharynx as per pre-operative assessment

## 2024-02-19 NOTE — Discharge Instructions (Addendum)
 You may shower starting tomorrow  You may use an ice pack, Tylenol, and ibuprofen also for pain.  No Tylenol before 1:15pm today.  No vigorous activity for 1 week   Post Anesthesia Home Care Instructions  Activity: Get plenty of rest for the remainder of the day. A responsible individual must stay with you for 24 hours following the procedure.  For the next 24 hours, DO NOT: -Drive a car -Advertising copywriter -Drink alcoholic beverages -Take any medication unless instructed by your physician -Make any legal decisions or sign important papers.  Meals: Start with liquid foods such as gelatin or soup. Progress to regular foods as tolerated. Avoid greasy, spicy, heavy foods. If nausea and/or vomiting occur, drink only clear liquids until the nausea and/or vomiting subsides. Call your physician if vomiting continues.  Special Instructions/Symptoms: Your throat may feel dry or sore from the anesthesia or the breathing tube placed in your throat during surgery. If this causes discomfort, gargle with warm salt water. The discomfort should disappear within 24 hours.  If you had a scopolamine patch placed behind your ear for the management of post- operative nausea and/or vomiting:  1. The medication in the patch is effective for 72 hours, after which it should be removed.  Wrap patch in a tissue and discard in the trash. Wash hands thoroughly with soap and water. 2. You may remove the patch earlier than 72 hours if you experience unpleasant side effects which may include dry mouth, dizziness or visual disturbances. 3. Avoid touching the patch. Wash your hands with soap and water after contact with the patch.

## 2024-02-19 NOTE — Interval H&P Note (Signed)
 History and Physical Interval Note: No change in history and physical other than the patient would like a very small skin lesion right at the edge of the large upper back/lower neck mass excised at the surgery as well.  It is approximately 2 to 3 mm in size.  We have added that to the consent  02/19/2024 7:11 AM  Chad Spears  has presented today for surgery, with the diagnosis of POSTERIOR NECK MASS.  The various methods of treatment have been discussed with the patient and family. After consideration of risks, benefits and other options for treatment, the patient has consented to  Procedure(s) with comments: EXCISION, MASS, NECK (N/A) - Excision posterior neck mass and excision of posterior back skin lesion as a surgical intervention.  The patient's history has been reviewed, patient examined, no change in status, stable for surgery.  I have reviewed the patient's chart and labs.  Questions were answered to the patient's satisfaction.     Abigail Miyamoto

## 2024-02-19 NOTE — Op Note (Signed)
   Chad Spears 02/19/2024   Pre-op Diagnosis: POSTERIOR NECK/UPPER BACK MASS AND SKIN TAG     Post-op Diagnosis: SAME  Procedure(s): EXCISION 9 CM POSTERIOR NECK/UPPER BACK MASS AND 2 MM SKIN TAG  Surgeon(s): Abigail Miyamoto, MD  Anesthesia: General  Staff:  Circulator: Lenn Cal, RN Scrub Person: Donald Pore, CST Circulator Assistant: McDonough-Hughes, Maceo Pro, RN OR Clinical Technician: Whitney Muse C  Estimated Blood Loss: Minimal               Specimens: SENT TO PATH  Indications: This is a 43 year old gentleman with a recurrent mass at the base of his neck/upper back.  He had a lipoma removed in this area 15 years ago.  He now has a 9 cm mass in the same location which is causing discomfort.  On the day of surgery he also requested that a small skin tag be removed that is right adjacent to the old scar that will be used for excision of the mass.  Procedure: The patient was brought to the operating identifies the correct patient.  He was placed upon on the operating room table and general anesthesia was induced.  He was next placed in the left lateral decubitus position.  His posterior neck and upper back were then prepped and draped in usual sterile fashion.  Using a #15 blade I excised the previous scar which is over the top of the soft mass.  I grabbed the small skin tag which was just to the right of the incision with forceps and excised it with the cautery.  After excised the skin of the old scar I then dissected down to the subcutaneous mass.  It was again a large mass consistent with a lipoma.  I excised it in its entirety with the electrocautery.  It was approximately 9 cm in size.  It was sent to pathology for evaluation.  I thoroughly examined the incision and saw no other lipomatous fat.  I injected Marcaine into the area circumferentially for anesthetic.  Hemostasis appeared to be achieved.  I then closed the deep subcutaneous tissue with interrupted  2-0 Vicryl sutures.  I then closed the superficial subcutaneous tissue with interrupted 3-0 Vicryl sutures and closed the skin with a running 4-0 Monocryl.  Dermabond was then applied.  The patient tolerated the procedure well.  All the counts were correct at the end of the procedure.  The patient was then placed back into a supine position.  He was then extubated in the operating room and taken in a stable condition to the recovery room.          Abigail Miyamoto   Date: 02/19/2024  Time: 8:11 AM

## 2024-02-19 NOTE — Anesthesia Postprocedure Evaluation (Signed)
 Anesthesia Post Note  Patient: Chad Spears  Procedure(s) Performed: EXCISION POSTERIOR NECK MASS AND SKIN LESION (Neck)     Patient location during evaluation: PACU Anesthesia Type: General Level of consciousness: awake Pain management: pain level controlled Vital Signs Assessment: post-procedure vital signs reviewed and stable Respiratory status: spontaneous breathing, nonlabored ventilation and respiratory function stable Cardiovascular status: blood pressure returned to baseline and stable Postop Assessment: no apparent nausea or vomiting Anesthetic complications: no   No notable events documented.  Last Vitals:  Vitals:   02/19/24 0845 02/19/24 0855  BP: (!) 124/92 119/85  Pulse: 86   Resp: 14 15  Temp:  (!) 36.4 C  SpO2: 95% 97%    Last Pain:  Vitals:   02/19/24 0855  TempSrc:   PainSc: 0-No pain                 Ophelia Sipe P Romen Yutzy

## 2024-02-19 NOTE — Transfer of Care (Signed)
 Immediate Anesthesia Transfer of Care Note  Patient: Chad Spears  Procedure(s) Performed: Procedure(s) (LRB): EXCISION POSTERIOR NECK MASS AND SKIN LESION (N/A)  Patient Location: PACU  Anesthesia Type: General  Level of Consciousness: awake, oriented, sedated and patient cooperative  Airway & Oxygen Therapy: Patient Spontanous Breathing and Patient connected to face mask oxygen  Post-op Assessment: Report given to PACU RN and Post -op Vital signs reviewed and stable  Post vital signs: Reviewed and stable  Complications: No apparent anesthesia complications Last Vitals:  Vitals Value Taken Time  BP 139/80 02/19/24 0822  Temp 36.5 C 02/19/24 0822  Pulse 90 02/19/24 0824  Resp 17 02/19/24 0824  SpO2 97 % 02/19/24 0824  Vitals shown include unfiled device data.  Last Pain:  Vitals:   02/19/24 0714  TempSrc:   PainSc: 0-No pain         Complications: No notable events documented.

## 2024-02-19 NOTE — Anesthesia Preprocedure Evaluation (Addendum)
 Anesthesia Evaluation  Patient identified by MRN, date of birth, ID band Patient awake    Reviewed: Allergy & Precautions, NPO status , Patient's Chart, lab work & pertinent test results  Airway Mallampati: II  TM Distance: >3 FB Neck ROM: Full    Dental no notable dental hx.    Pulmonary neg pulmonary ROS   Pulmonary exam normal        Cardiovascular negative cardio ROS Normal cardiovascular exam     Neuro/Psych  Headaches PSYCHIATRIC DISORDERS Anxiety      Neuromuscular disease    GI/Hepatic negative GI ROS,,,(+) Hepatitis -  Endo/Other  negative endocrine ROS    Renal/GU negative Renal ROS     Musculoskeletal negative musculoskeletal ROS (+)    Abdominal  (+) + obese  Peds  Hematology negative hematology ROS (+)   Anesthesia Other Findings POSTERIOR NECK MASS  Reproductive/Obstetrics                             Anesthesia Physical Anesthesia Plan  ASA: 2  Anesthesia Plan: General   Post-op Pain Management:    Induction: Intravenous  PONV Risk Score and Plan: 2 and Ondansetron, Dexamethasone, Midazolam and Treatment may vary due to age or medical condition  Airway Management Planned: Oral ETT  Additional Equipment:   Intra-op Plan:   Post-operative Plan: Extubation in OR  Informed Consent: I have reviewed the patients History and Physical, chart, labs and discussed the procedure including the risks, benefits and alternatives for the proposed anesthesia with the patient or authorized representative who has indicated his/her understanding and acceptance.     Dental advisory given  Plan Discussed with: CRNA  Anesthesia Plan Comments:        Anesthesia Quick Evaluation

## 2024-02-20 ENCOUNTER — Encounter (HOSPITAL_BASED_OUTPATIENT_CLINIC_OR_DEPARTMENT_OTHER): Payer: Self-pay | Admitting: Surgery

## 2024-02-20 LAB — SURGICAL PATHOLOGY

## 2024-02-24 ENCOUNTER — Other Ambulatory Visit: Payer: Self-pay | Admitting: Internal Medicine

## 2024-02-29 ENCOUNTER — Encounter: Payer: Self-pay | Admitting: Internal Medicine

## 2024-02-29 NOTE — Assessment & Plan Note (Signed)
  Working 2 jobs 7 d a week Meloxicam  as needed Flexeril  as needed

## 2024-02-29 NOTE — Assessment & Plan Note (Signed)
 Diet discussed.  Cont on good diet and use Phentermine . Potential benefits of a long term adipex use as well as potential risks  and complications were explained to the patient and were aknowledged.

## 2024-03-11 ENCOUNTER — Ambulatory Visit: Payer: BC Managed Care – PPO | Admitting: Internal Medicine

## 2024-03-11 ENCOUNTER — Other Ambulatory Visit: Payer: Self-pay | Admitting: Internal Medicine

## 2024-03-15 ENCOUNTER — Telehealth: Payer: Self-pay | Admitting: Internal Medicine

## 2024-03-15 ENCOUNTER — Ambulatory Visit: Payer: Self-pay | Admitting: *Deleted

## 2024-03-15 NOTE — Telephone Encounter (Signed)
 Pt requesting methylprednisolone . Per chart review pt was triaged by another RN today for a rash to his face. See other encounter.

## 2024-03-15 NOTE — Telephone Encounter (Signed)
 Copied from CRM [id number]. Topic: Clinical - Medication Refill >> Mar 15, 2024  3:28 PM Alyse July wrote: Most Recent Primary Care Visit:  Provider: Genia Kettering  Department: [facility]  Visit Type: OFFICE VISIT  Date: 01/22/2024  Medication:methylPREDNISolone (patient requested despite it being discontinued by provider. Patient states Rash has returned)  Has the patient contacted their pharmacy? Yes (Agent: If no, request that the patient contact the pharmacy for the refill. If patient does not wish to contact the pharmacy document the reason why and proceed with request.) (Agent: If yes, when and what did the pharmacy advise?)  Is this the correct pharmacy for this prescription? Yes If no, delete pharmacy and type the correct one.  This is the patient's preferred pharmacy:  [facility] [address], [place] - [address] [place] [address] Phone: [phone] Fax: [phone]   Has the prescription been filled recently? No  Is the patient out of the medication? Yes  Has the patient been seen for an appointment in the last year OR does the patient have an upcoming appointment? Yes  Can we respond through MyChart? No  Agent: Please be advised that Rx refills may take up to 3 business days. We ask that you follow-up with your pharmacy.

## 2024-03-15 NOTE — Telephone Encounter (Signed)
 Copied from CRM [id number]. Topic: Clinical - Prescription Issue >> Mar 15, 2024  1:55 PM Dewanda Foots wrote: Reason for CRM: Pt is returning phone call in regards to methylprednisolone  refill. States he called earlier to have this medication called in and was told someone would call him  back within an hour. This was around 12:30. Pt would like to know the status of this refill and to see if we are going to send it in for him for the rash he is experiencing.  Please call patient back at [phone]

## 2024-03-15 NOTE — Telephone Encounter (Signed)
 Second attempt to reach patient regarding symptoms- no answer- left message to call office.

## 2024-03-15 NOTE — Telephone Encounter (Signed)
  Chief Complaint: facial rash or outbreak Symptoms: bumps to face Frequency: started a week ago  Pertinent Negatives: Patient denies fever Disposition: [] ED /[] Urgent Care (no appt availability in office) / [] Appointment(In office/virtual)/ []  [facility]/ [] Home Care/ [] Refused Recommended Disposition /[] [facility]/ [x]  Follow-up with PCP Additional Notes: patient calling after requesting steroid for facial rash or outbreak. Patient states facial rash started a week ago. Describes rash as bumps which are "a little big," Per protocol, patient is recommended to be seen within 3 days. Attempted to schedule patient with another provider due to PCP not having availability within the protocol time period. Patient refused to be scheduled with another provider in office and is asking to be seen by PCP. Patient is also asking for steroid to be sent in. Please follow up with patient.    Reason for Disposition  Localized rash present > 7 days  Answer Assessment - Initial Assessment Questions 1. APPEARANCE of RASH: "Describe the rash."      bumps 2. LOCATION: "Where is the rash located?"      face 3. NUMBER: "How many spots are there?"      Several spots 4. SIZE: "How big are the spots?" (Inches, centimeters or compare to size of a coin)      "A little big" 5. ONSET: "When did the rash start?"      Started last Monday 6. ITCHING: "Does the rash itch?" If Yes, ask: "How bad is the itch?"  (Scale 0-10; or none, mild, moderate, severe)     mild 7. PAIN: "Does the rash hurt?" If Yes, ask: "How bad is the pain?"  (Scale 0-10; or none, mild, moderate, severe)    - NONE (0): no pain    - MILD (1-3): doesn't interfere with normal activities     - MODERATE (4-7): interferes with normal activities or awakens from sleep     - SEVERE (8-10): excruciating pain, unable to do any normal activities     Mild 8. OTHER SYMPTOMS: "Do you have any other symptoms?" (e.g., fever)      no  Protocols used: Rash or Redness - Localized-A-AH

## 2024-03-15 NOTE — Telephone Encounter (Signed)
 Copied from CRM [id number]. Topic: Clinical - Prescription Issue >> Mar 15, 2024  8:25 AM Jayson Michael wrote: Reason for CRM: Patient contacted pharmacy on 03/12/24 requesting methylprednisolone  refill; pharmacy directed him to PCP. Prescription was discontinued on 02/11/24-refill not appropriate. Informed patient and offered appointment due to facial breakout; patient declined and requested refill today. Advised request will be sent to nurse for review, but appointment likely needed. Patient may miss call due to work and asked for callback if no answer. Patient callback [phone]  Attempted to call patient to discuss request and symptoms- see OV note 01/22/24

## 2024-03-16 NOTE — Telephone Encounter (Signed)
 Copied from CRM [id number]. Topic: Clinical - Prescription Issue >> Mar 16, 2024 11:23 AM Adaysia C wrote: Reason for CRM: Patient has requested for the PCP to prescibe the pill version of the RX:methylprednisolone  because the cream does not work for his rashes; patient has requested a call back after the prescription has been submitted #[phone]

## 2024-03-17 MED ORDER — METHYLPREDNISOLONE 4 MG PO TBPK: ORAL_TABLET | ORAL | 0 refills | Status: DC

## 2024-03-17 NOTE — Telephone Encounter (Signed)
 Okay to renew Medrol  pack  thanks

## 2024-03-17 NOTE — Telephone Encounter (Signed)
 Please see my other note.  He needs to see a dermatologist (hope the appointment was made).  Thanks

## 2024-03-17 NOTE — Addendum Note (Signed)
 Addended by: Auriah Hollings V on: 03/17/2024 07:24 AM   Modules accepted: Orders

## 2024-03-18 ENCOUNTER — Other Ambulatory Visit: Payer: Self-pay

## 2024-03-18 MED ORDER — METHYLPREDNISOLONE 4 MG PO TBPK: ORAL_TABLET | ORAL | 0 refills | Status: DC

## 2024-03-18 NOTE — Telephone Encounter (Signed)
 Medication has been sent in to pts pharmacy. Pt did not answer.

## 2024-03-29 ENCOUNTER — Telehealth: Payer: Self-pay | Admitting: Internal Medicine

## 2024-03-29 NOTE — Telephone Encounter (Signed)
 Copied from CRM [id number]. Topic: Clinical - Medication Refill >> Mar 29, 2024  3:45 PM Abigail D wrote: Medication:  oxyCODONE  (OXY IR/ROXICODONE ) 5 MG immediate release tablet    Has the patient contacted their pharmacy? Yes, told to contact Dr. (Agent: If no, request that the patient contact the pharmacy for the refill. If patient does not wish to contact the pharmacy document the reason why and proceed with request.) (Agent: If yes, when and what did the pharmacy advise?)  This is the patient's preferred pharmacy:  [facility] [address] [place] - [address] [place] [place] [address] Phone: [phone] Fax: [phone]  Is this the correct pharmacy for this prescription? Yes If no, delete pharmacy and type the correct one.   Has the prescription been filled recently? Yes  Is the patient out of the medication? Yes  Has the patient been seen for an appointment in the last year OR does the patient have an upcoming appointment? Yes  Can we respond through MyChart? No  Agent: Please be advised that Rx refills may take up to 3 business days. We ask that you follow-up with your pharmacy.

## 2024-04-01 ENCOUNTER — Telehealth: Payer: Self-pay | Admitting: Internal Medicine

## 2024-04-01 NOTE — Telephone Encounter (Signed)
 Copied from CRM [id number]. Topic: Clinical - Prescription Issue >> Apr 01, 2024 12:32 PM Clyde Darling P wrote: Reason for CRM: Pt would like oxyCODONE  (OXY IR/ROXICODONE ) 5 MG immediate release tablet refill, want to know if he has to be seen with PCP before getting it refill or if PCP could write a prescription- pt would like to be contacted at [phone]

## 2024-04-06 NOTE — Telephone Encounter (Signed)
 Copied from CRM [id number]. Topic: Clinical - Medication Refill >> Mar 29, 2024  3:45 PM Abigail D wrote: Medication:  oxyCODONE  (OXY IR/ROXICODONE ) 5 MG immediate release tablet    Has the patient contacted their pharmacy? Yes, told to contact Dr. (Agent: If no, request that the patient contact the pharmacy for the refill. If patient does not wish to contact the pharmacy document the reason why and proceed with request.) (Agent: If yes, when and what did the pharmacy advise?)  This is the patient's preferred pharmacy:  [facility] [address], [place] - [address] [place] [address] Phone: [phone] Fax: [phone]  Is this the correct pharmacy for this prescription? Yes If no, delete pharmacy and type the correct one.   Has the prescription been filled recently? Yes  Is the patient out of the medication? Yes  Has the patient been seen for an appointment in the last year OR does the patient have an upcoming appointment? Yes  Can we respond through MyChart? No  Agent: Please be advised that Rx refills may take up to 3 business days. We ask that you follow-up with your pharmacy. >> Apr 06, 2024  1:36 PM Corin V wrote: Patient called to follow up on this refill since it has been over a week. HE did confirm Dr. Lucienne Ryder stated he will not be submitting refills but Dr. Georgia Kipper was prescribing it previously. Please call patient back today if script will not be filled.

## 2024-04-08 NOTE — Telephone Encounter (Signed)
 Copied from CRM [id number]. Topic: Clinical - Prescription Issue >> Apr 07, 2024  4:52 PM Magdalene School wrote: Patient calling because he has not heard back regarding this medication refill request, patient would like a call back.

## 2024-04-09 NOTE — Telephone Encounter (Signed)
 Copied from CRM [id number]. Topic: Clinical - Medication Question >> Apr 08, 2024 10:31 AM Dewanda Foots wrote: Reason for CRM: Pt is calling in again to check on the status of his OxyCodone  refill request. He states he has been calling since 5/19 and has to continue to call to find out the status of this. I told him that they sent the request to the provider and we are awaiting the response from the Dr. Liana Reding over to the [facility] and was informed that he initiated the request during a period when the provider was out of office, but that they will try to get it to him to get him to response to the refill request. >> Apr 09, 2024 12:55 PM Odilia Bennett D wrote: Patient called stating he would like a follow-up on a medication refill  CB [phone]

## 2024-04-11 NOTE — Telephone Encounter (Signed)
 Chad Spears need to contact Dr. Lucienne Ryder.  Thanks

## 2024-04-12 NOTE — Telephone Encounter (Signed)
 Tried to contact pt to inform him of providers advice as follows per PCP "Royston Cornea need to contact Dr. Lucienne Ryder. Thanks "  Please inform pt of the above if he calls back.

## 2024-04-12 NOTE — Telephone Encounter (Addendum)
 Please inform the pt he has to call Dr. Lucienne Ryder off as he was the provider to rx this medication upon his return call back to the office as I tried to call pt and was unable to reach him.

## 2024-04-18 ENCOUNTER — Emergency Department (HOSPITAL_COMMUNITY)

## 2024-04-18 ENCOUNTER — Other Ambulatory Visit: Payer: Self-pay

## 2024-04-18 ENCOUNTER — Emergency Department (HOSPITAL_COMMUNITY)
Admission: EM | Admit: 2024-04-18 | Discharge: 2024-04-18 | Disposition: A | Discharge status: Home / Self Care | Attending: Emergency Medicine | Admitting: Emergency Medicine

## 2024-04-18 DIAGNOSIS — M79671 Pain in right foot: Secondary | ICD-10-CM | POA: Insufficient documentation

## 2024-04-18 DIAGNOSIS — M79672 Pain in left foot: Secondary | ICD-10-CM | POA: Insufficient documentation

## 2024-04-18 DIAGNOSIS — L819 Disorder of pigmentation, unspecified: Secondary | ICD-10-CM | POA: Diagnosis not present

## 2024-04-18 LAB — I-STAT CHEM 8, ED
BUN: 10 mg/dL (ref 6–20)
Calcium, Ion: 1.25 mmol/L (ref 1.15–1.40)
Chloride: 105 mmol/L (ref 98–111)
Creatinine, Ser: 0.9 mg/dL (ref 0.61–1.24)
Glucose, Bld: 85 mg/dL (ref 70–99)
HCT: 43 % (ref 39.0–52.0)
Hemoglobin: 14.6 g/dL (ref 13.0–17.0)
Potassium: 4.2 mmol/L (ref 3.5–5.1)
Sodium: 141 mmol/L (ref 135–145)
TCO2: 24 mmol/L (ref 22–32)

## 2024-04-18 MED ORDER — NAPROXEN 500 MG PO TABS: 500.0000 mg | ORAL_TABLET | Freq: Two times a day (BID) | ORAL | 0 refills | Status: AC

## 2024-04-18 NOTE — ED Provider Notes (Signed)
 [facility] Provider Note   CSN: [id number] Arrival date & time: 04/18/24  4098     History  Chief Complaint  Patient presents with   Foot Pain    BL feet     Chad Spears is a 43 y.o. male.   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  Patient states has been having the symptoms for over a year.  Patient points to the heels of his feet.  Patient states he also has noticed some spots on the skin of his lower legs below the knee.  He denies any fevers or chills.  Patient states he has an upcoming appointment but it was bothering him too much so he came to the ED.  No recent falls or injuries.    Home Medications Prior to Admission medications   Medication Sig Start Date End Date Taking? Authorizing Provider  naproxen  (NAPROSYN ) 500 MG tablet Take 1 tablet (500 mg total) by mouth 2 (two) times daily with a meal for 14 days. As needed for pain 04/18/24 05/02/24 Yes Trish Furl, MD  clotrimazole -betamethasone  (LOTRISONE ) cream Apply topically 2 (two) times daily. Use for groin rash 08/21/23   Plotnikov, Aleksei V, MD  Emollient [facility] THERAPEUTIC DRY SKIN) CREA APPLY TOPICALLY AS NEEDED FOR DRY SKIN 09/06/23   [provider]  hydrOXYzine  (ATARAX ) 25 MG tablet TAKE 2 & 1/2 (TWO & ONE-HALF) TABLETS BY MOUTH EVERY 6 HOURS 02/24/24   Webb, Padonda B, FNP  hydrOXYzine  (VISTARIL ) 25 MG capsule Take 1-2 capsules (25-50 mg total) by mouth at bedtime as needed for itching. 01/22/24   Plotnikov, Aleksei V, MD  loratadine  (CLARITIN ) 10 MG tablet Take 1 tablet (10 mg total) by mouth daily. 02/06/23   Plotnikov, Aleksei V, MD  methocarbamol  (ROBAXIN ) 500 MG tablet Take 1 tablet at bedtime as needed for back spasm/back painTake 1 tablet at bedtime as needed for back spasm/back pain 08/21/23   Plotnikov, Oakley Bellman, MD   methylPREDNISolone  (MEDROL  DOSEPAK) 4 MG TBPK tablet As directed 03/18/24   Plotnikov, Aleksei V, MD  oxyCODONE  (OXY IR/ROXICODONE ) 5 MG immediate release tablet Take 1 tablet (5 mg total) by mouth every 6 (six) hours as needed for moderate pain (pain score 4-6) or severe pain (pain score 7-10). 02/19/24   Oza Blumenthal, MD  phentermine  (ADIPEX-P ) 37.5 MG tablet Take 1 tablet (37.5 mg total) by mouth daily before breakfast. 12/11/23   Plotnikov, Oakley Bellman, MD  sildenafil  (REVATIO ) 20 MG tablet TAKE 1 TO 4 TABLETS BY MOUTH ONCE DAILY AS NEEDED 12/30/23   Plotnikov, Oakley Bellman, MD  Skin Protectants, Misc. (EUCERIN) cream Apply topically as needed for dry skin. 09/06/23   Sueellen Emery, MD  triamcinolone  cream (KENALOG ) 0.1 % Apply 1 Application topically 3 (three) times daily. On skin rash 01/22/24   Plotnikov, Oakley Bellman, MD  Vitamin D , Ergocalciferol , (DRISDOL ) 1.25 MG (50000 UNIT) CAPS capsule TAKE 1 CAPSULE BY MOUTH ONCE EVERY MONTH 08/21/23   Plotnikov, Aleksei V, MD      Allergies    Amitiza  [lubiprostone ] and Iohexol     Review of Systems   Review of Systems  Physical Exam Updated Vital Signs BP (!) 139/90   Pulse 78   Temp 98.2 F (36.8 C) (Oral)   Resp 18   Ht 1.829 m (6')   Wt 99.8 kg   SpO2 100%  BMI 29.84 kg/m  Physical Exam Vitals and nursing note reviewed.  Constitutional:      General: He is not in acute distress.    Appearance: He is well-developed.  HENT:     Head: Normocephalic and atraumatic.  Eyes:     General: No scleral icterus.       Right eye: No discharge.        Left eye: No discharge.     Conjunctiva/sclera: Conjunctivae normal.  Neck:     Trachea: No tracheal deviation.  Cardiovascular:     Rate and Rhythm: Normal rate.  Pulmonary:     Effort: Pulmonary effort is normal. No respiratory distress.     Breath sounds: No stridor.  Musculoskeletal:        General: Tenderness present. No swelling or deformity.     Cervical back: Neck supple.      Comments: Mild tenderness bilateral heels, no erythema or edema, dorsalis pedal pulses strong bilaterally  Skin:    General: Skin is warm and dry.     Findings: No rash.     Comments: Small areas of hyperpigmentation noted on the lower extremities, well-healed wound left shin, no erythema, no purulent drainage  Neurological:     Mental Status: He is alert. Mental status is at baseline.     Cranial Nerves: No dysarthria or facial asymmetry.     Motor: No seizure activity.     ED Results / Procedures / Treatments   Labs (all labs ordered are listed, but only abnormal results are displayed) Labs Reviewed  I-STAT CHEM 8, ED    EKG None  Radiology DG Foot Complete Right Result Date: 04/18/2024 CLINICAL DATA:  Worsening chronic bilateral foot pain. EXAM: RIGHT FOOT COMPLETE - 3+ VIEW COMPARISON:  None Available. FINDINGS: There is no evidence of fracture or dislocation. There is no evidence of arthropathy or other focal bone abnormality. Soft tissues are unremarkable. IMPRESSION: Negative. Electronically Signed   By: Virgle Grime M.D.   On: 04/18/2024 10:13   DG Foot Complete Left Result Date: 04/18/2024 CLINICAL DATA:  Chronic bilateral foot pain. EXAM: LEFT FOOT - COMPLETE 3+ VIEW COMPARISON:  None Available. FINDINGS: The fifth left toe is flexed and subsequently limited in evaluation. There is no evidence of fracture or dislocation. There is no evidence of arthropathy or other focal bone abnormality. Soft tissues are unremarkable. IMPRESSION: Negative. Electronically Signed   By: Virgle Grime M.D.   On: 04/18/2024 10:10    Procedures Procedures    Medications Ordered in ED Medications - No data to display  ED Course/ Medical Decision Making/ A&P Clinical Course as of 04/18/24 1222  Sun Apr 18, 2024  1058 X-rays without acute findings [JK]  1204 I-stat chem 8, ED (not at [facility], [facility] or [facility]) Laboratory tests without abnormalities [JK]    Clinical Course User Index [JK]  Trish Furl, MD                                 Medical Decision Making Amount and/or Complexity of Data Reviewed Labs: ordered. Decision-making details documented in ED Course. Radiology: ordered.  Risk Prescription drug management.  Outpatient records reviewed.  Do not see any imaging of his feet.  Will proceed with that while he is in the ED today.  Patient's x-rays do not show any acute abnormalities.  His i-STAT does not any signs of abnormal renal function or electrolyte abnormalities.  While the patient was here he also pointed to nonspecific areas of discoloration in the skin.  No clear rash appreciated.  No signs of infection.  Patient does not have any signs of infection.  No vascular compromise.  Suspect his symptoms could be related to plantars fasciitis.  Recommend follow-up with his PCP.  Will also have him follow-up with podiatry.        Final Clinical Impression(s) / ED Diagnoses Final diagnoses:  Foot pain, right  Foot pain, left    Rx / DC Orders ED Discharge Orders          Ordered    naproxen  (NAPROSYN ) 500 MG tablet  2 times daily with meals        04/18/24 1217              Trish Furl, MD 04/18/24 1222

## 2024-04-18 NOTE — Discharge Instructions (Addendum)
 Take the medications to help with your pain.  Follow up with your primary care doctor and consider seeing a podiatrist for further evaluation.

## 2024-04-18 NOTE — ED Triage Notes (Signed)
 Pt has been having pain to both feet for years. Describes pain as burning. Denies history of DM. Denies wounds or injury to feet. Pt is ambulatory

## 2024-04-19 ENCOUNTER — Telehealth: Payer: Self-pay | Admitting: Internal Medicine

## 2024-04-19 ENCOUNTER — Other Ambulatory Visit: Payer: Self-pay | Admitting: Internal Medicine

## 2024-04-19 ENCOUNTER — Ambulatory Visit: Payer: Self-pay | Admitting: *Deleted

## 2024-04-19 NOTE — Telephone Encounter (Signed)
 FYI Only or Action Required?: FYI only for provider  Patient was last seen in primary care on 01/22/2024 by Plotnikov, Oakley Bellman, MD. Called Nurse Triage reporting Foot Swelling. Symptoms began chronic foot swelling/pain-R foot. Interventions attempted: Prescription medications: prednisone , pain medication- patient is requesting refill-see refill request. Symptoms are: unchanged.  Triage Disposition: See Physician Within 24 Hours  Patient/caregiver understands and will follow disposition?: No, refuses disposition Reason for Disposition  [1] MODERATE leg swelling (e.g., swelling extends up to knees) AND [2] new-onset or worsening  Answer Assessment - Initial Assessment Questions 1. ONSET: "When did the swelling start?" (e.g., minutes, hours, days)     Ongoing- long time- has seen specialist 2. LOCATION: "What part of the leg is swollen?"  "Are both legs swollen or just one leg?"     R foot swelling 3. SEVERITY: "How bad is the swelling?" (e.g., localized; mild, moderate, severe)   - Localized: Small area of swelling localized to one leg.   - MILD pedal edema: Swelling limited to foot and ankle, pitting edema < 1/4 inch (6 mm) deep, rest and elevation eliminate most or all swelling.   - MODERATE edema: Swelling of lower leg to knee, pitting edema > 1/4 inch (6 mm) deep, rest and elevation only partially reduce swelling.   - SEVERE edema: Swelling extends above knee, facial or hand swelling present.      Mild 4. REDNESS: "Does the swelling look red or infected?"     no 5. PAIN: "Is the swelling painful to touch?" If Yes, ask: "How painful is it?"   (Scale 1-10; mild, moderate or severe)     A lot of pain in foot 6. FEVER: "Do you have a fever?" If Yes, ask: "What is it, how was it measured, and when did it start?"      no 7. CAUSE: "What do you think is causing the leg swelling?"     walking 8. MEDICAL HISTORY: "Do you have a history of blood clots (e.g., DVT), cancer, heart failure, kidney  disease, or liver failure?"     no 9. RECURRENT SYMPTOM: "Have you had leg swelling before?" If Yes, ask: "When was the last time?" "What happened that time?"     yes 10. OTHER SYMPTOMS: "Do you have any other symptoms?" (e.g., chest pain, difficulty breathing)       Sweating-head, "breaking out- acne all over  Protocols used: Leg Swelling and Edema-A-AH    Copied from CRM #[id number]. Topic: Clinical - Red Word Triage >> Apr 19, 2024  3:27 PM Howard Macho wrote: Red Word that prompted transfer to Nurse Triage: Patient stated he has swelling in his foot and it is painful and bad and he is breaking out with sweat

## 2024-04-19 NOTE — Telephone Encounter (Signed)
 Copied from CRM [id number]. Topic: Clinical - Medication Refill >> Apr 19, 2024  1:50 PM Aisha D wrote: Medication: methocarbamol  (ROBAXIN ) 500 MG tablet, hydrOXYzine  (ATARAX ) 25 MG tablet, naproxen  (NAPROSYN ) 500 MG tablet, methylPREDNISolone  (MEDROL  DOSEPAK) 4 MG TBPK tablet  Has the patient contacted their pharmacy? Yes (Agent: If no, request that the patient contact the pharmacy for the refill. If patient does not wish to contact the pharmacy document the reason why and proceed with request.) (Agent: If yes, when and what did the pharmacy advise?)  This is the patient's preferred pharmacy:  [facility] [address], [place] - [address] [place] [address] Phone: [phone] Fax: [phone]  Is this the correct pharmacy for this prescription? Yes If no, delete pharmacy and type the correct one.   Has the prescription been filled recently? No  Is the patient out of the medication? Yes  Has the patient been seen for an appointment in the last year OR does the patient have an upcoming appointment? Yes  Can we respond through MyChart? Yes  Agent: Please be advised that Rx refills may take up to 3 business days. We ask that you follow-up with your pharmacy.

## 2024-04-19 NOTE — Telephone Encounter (Unsigned)
 Copied from CRM [id number]. Topic: Appointments - Appointment Info/Confirmation >> Apr 19, 2024  3:11 PM Jim Motts C wrote: Patient/patient representative is calling for information regarding an appointment. Patient also called in to receive phone number for Dermatology office that he was referred to. >> Apr 19, 2024  3:26 PM Odilia Bennett D wrote: Patient called stating he want to referred back to [facility]. Patient stated he talked to the dermatology office and they stated that he would have to call his primary office for him to be referred back to them. Patient stated he has swelling in his foot and it is painful and bad and he is breaking out with sweat  CB [phone]

## 2024-04-19 NOTE — Telephone Encounter (Signed)
 Last Fill: Methocarbamol : 08/21/23     Hydroxyzine : 01/22/24     Prednisone : 03/18/24  Last OV: 01/22/24 Next OV: 04/22/24  Routing to provider for review/authorization.

## 2024-04-20 ENCOUNTER — Encounter: Payer: Self-pay | Admitting: Internal Medicine

## 2024-04-20 NOTE — Telephone Encounter (Signed)
 Duplicate      Copied from CRM [id number]. Topic: Clinical - Medication Refill >> Apr 19, 2024  1:50 PM Aisha D wrote: Medication: methocarbamol  (ROBAXIN ) 500 MG tablet, hydrOXYzine  (ATARAX ) 25 MG tablet, naproxen  (NAPROSYN ) 500 MG tablet, methylPREDNISolone  (MEDROL  DOSEPAK) 4 MG TBPK tablet  Has the patient contacted their pharmacy? Yes (Agent: If no, request that the patient contact the pharmacy for the refill. If patient does not wish to contact the pharmacy document the reason why and proceed with request.) (Agent: If yes, when and what did the pharmacy advise?)  This is the patient's preferred pharmacy:  [facility] [address] [place] - [address] [place] [address] Phone: [phone] Fax: [phone]  Is this the correct pharmacy for this prescription? Yes If no, delete pharmacy and type the correct one.   Has the prescription been filled recently? No  Is the patient out of the medication? Yes  Has the patient been seen for an appointment in the last year OR does the patient have an upcoming appointment? Yes  Can we respond through MyChart? Yes  Agent: Please be advised that Rx refills may take up to 3 business days. We ask that you follow-up with your pharmacy. >> Apr 20, 2024  2:18 PM Chuck Crater wrote: Patient is completely out of all meds.This encounter was created in error - please disregard.

## 2024-04-21 NOTE — Telephone Encounter (Signed)
 The referral to [facility] dermatology was placed in March.  It is active.  Thanks

## 2024-04-21 NOTE — Telephone Encounter (Signed)
 Per pts PCP The referral to [facility] dermatology was placed in March.  It is active.  Thanks  Please inform pt of this on his return phone call to the clinic.

## 2024-04-22 ENCOUNTER — Encounter: Payer: Self-pay | Admitting: Internal Medicine

## 2024-04-22 ENCOUNTER — Ambulatory Visit (INDEPENDENT_AMBULATORY_CARE_PROVIDER_SITE_OTHER): Admitting: Internal Medicine

## 2024-04-22 VITALS — BP 108/70 | HR 88 | Temp 98.5°F | Ht 72.0 in | Wt 232.0 lb

## 2024-04-22 DIAGNOSIS — R21 Rash and other nonspecific skin eruption: Secondary | ICD-10-CM | POA: Diagnosis not present

## 2024-04-22 DIAGNOSIS — R7612 Nonspecific reaction to cell mediated immunity measurement of gamma interferon antigen response without active tuberculosis: Secondary | ICD-10-CM

## 2024-04-22 DIAGNOSIS — N320 Bladder-neck obstruction: Secondary | ICD-10-CM | POA: Diagnosis not present

## 2024-04-22 DIAGNOSIS — M722 Plantar fascial fibromatosis: Secondary | ICD-10-CM | POA: Diagnosis not present

## 2024-04-22 DIAGNOSIS — R600 Localized edema: Secondary | ICD-10-CM

## 2024-04-22 MED ORDER — TADALAFIL 5 MG PO TABS: 5.0000 mg | ORAL_TABLET | Freq: Every day | ORAL | 11 refills | Status: DC

## 2024-04-22 MED ORDER — ONDANSETRON HCL 4 MG PO TABS: 4.0000 mg | ORAL_TABLET | Freq: Three times a day (TID) | ORAL | 0 refills | Status: DC | PRN

## 2024-04-22 MED ORDER — HYDROXYZINE HCL 25 MG PO TABS: 25.0000 mg | ORAL_TABLET | Freq: Three times a day (TID) | ORAL | 0 refills | Status: DC | PRN

## 2024-04-22 MED ORDER — METHOCARBAMOL 500 MG PO TABS: ORAL_TABLET | ORAL | 1 refills | Status: DC

## 2024-04-22 MED ORDER — METHYLPREDNISOLONE 4 MG PO TBPK: ORAL_TABLET | ORAL | 0 refills | Status: DC

## 2024-04-22 NOTE — Progress Notes (Signed)
 Subjective:  Patient ID: Chad Spears, male    DOB: 07/04/1981  Age: 43 y.o. MRN: [id number]  CC: Medical Management of Chronic Issues (Pt. States rash reappeared x2week pain and itching... having body cramps all over doing and heavy sweating... Also states the cramps last around 10 minutes sometimes 3 times in a day...Aaron Aasover the counter muscle cream gives him light relief.)   HPI Chad Spears presents for the rash reappeared x2week pain and itching... having body cramps all over doing and heavy sweating... Also states the cramps last around 10 minutes sometimes 3 times in a day...Aaron Aasover the counter muscle cream gives him light relief.  Chad Spears saw a dermatologist, but he did not have rash at the time - he was told to return w/rash.  C/o R leg swells w/pain, R heel hurts  Outpatient Medications Prior to Visit  Medication Sig Dispense Refill   clotrimazole -betamethasone  (LOTRISONE ) cream Apply topically 2 (two) times daily. Use for groin rash 90 g 1   Emollient (EQ THERAPEUTIC DRY SKIN) CREA APPLY TOPICALLY AS NEEDED FOR DRY SKIN     hydrOXYzine  (ATARAX ) 25 MG tablet Take 1 tablet (25 mg total) by mouth every 8 (eight) hours as needed. 90 tablet 0   loratadine  (CLARITIN ) 10 MG tablet Take 1 tablet (10 mg total) by mouth daily. 90 tablet 3   methocarbamol  (ROBAXIN ) 500 MG tablet Take 1 tablet at bedtime as needed for back spasm/back painTake 1 tablet at bedtime as needed for back spasm/back pain 90 tablet 1   methylPREDNISolone  (MEDROL  DOSEPAK) 4 MG TBPK tablet As directed 21 tablet 0   naproxen  (NAPROSYN ) 500 MG tablet Take 1 tablet (500 mg total) by mouth 2 (two) times daily with a meal for 14 days. As needed for pain 20 tablet 0   oxyCODONE  (OXY IR/ROXICODONE ) 5 MG immediate release tablet Take 1 tablet (5 mg total) by mouth every 6 (six) hours as needed for moderate pain (pain score 4-6) or severe pain (pain score 7-10). 20 tablet 0   phentermine  (ADIPEX-P ) 37.5 MG tablet Take 1 tablet (37.5 mg  total) by mouth daily before breakfast. 90 tablet 0   Skin Protectants, Misc. (EUCERIN) cream Apply topically as needed for dry skin. 454 g 0   triamcinolone  cream (KENALOG ) 0.1 % Apply 1 Application topically 3 (three) times daily. On skin rash 360 g 3   Vitamin D , Ergocalciferol , (DRISDOL ) 1.25 MG (50000 UNIT) CAPS capsule TAKE 1 CAPSULE BY MOUTH ONCE EVERY MONTH 3 capsule 3   sildenafil  (REVATIO ) 20 MG tablet TAKE 1 TO 4 TABLETS BY MOUTH ONCE DAILY AS NEEDED 240 tablet 0   hydrOXYzine  (VISTARIL ) 25 MG capsule Take 1-2 capsules (25-50 mg total) by mouth at bedtime as needed for itching. (Patient not taking: Reported on 04/22/2024) 60 capsule 2   No facility-administered medications prior to visit.    ROS: Review of Systems  Constitutional:  Negative for appetite change, fatigue and unexpected weight change.  HENT:  Negative for congestion, nosebleeds, sneezing, sore throat and trouble swallowing.   Eyes:  Negative for itching and visual disturbance.  Respiratory:  Negative for cough.   Cardiovascular:  Positive for leg swelling. Negative for chest pain and palpitations.  Gastrointestinal:  Negative for abdominal distention, blood in stool, diarrhea and nausea.  Genitourinary:  Negative for frequency and hematuria.  Musculoskeletal:  Positive for back pain and gait problem. Negative for joint swelling and neck pain.  Skin:  Positive for rash.  Neurological:  Negative for  dizziness, tremors, speech difficulty and weakness.  Psychiatric/Behavioral:  Negative for agitation, dysphoric mood, sleep disturbance and suicidal ideas. The patient is not nervous/anxious.     Objective:  BP 108/70   Pulse 88   Temp 98.5 F (36.9 C) (Oral)   Ht 6' (1.829 m)   Wt 232 lb (105.2 kg)   SpO2 99%   BMI 31.46 kg/m   BP Readings from Last 3 Encounters:  04/22/24 108/70  04/18/24 (!) 139/90  02/19/24 119/85    Wt Readings from Last 3 Encounters:  04/22/24 232 lb (105.2 kg)  04/18/24 220 lb (99.8  kg)  02/19/24 226 lb 6.6 oz (102.7 kg)    Physical Exam Constitutional:      General: He is not in acute distress.    Appearance: He is well-developed. He is obese.     Comments: NAD   Eyes:     Conjunctiva/sclera: Conjunctivae normal.     Pupils: Pupils are equal, round, and reactive to light.   Neck:     Thyroid : No thyromegaly.     Vascular: No JVD.   Cardiovascular:     Rate and Rhythm: Normal rate and regular rhythm.     Heart sounds: Normal heart sounds. No murmur heard.    No friction rub. No gallop.  Pulmonary:     Effort: Pulmonary effort is normal. No respiratory distress.     Breath sounds: Normal breath sounds. No wheezing or rales.  Chest:     Chest wall: No tenderness.  Abdominal:     General: Bowel sounds are normal. There is no distension.     Palpations: Abdomen is soft. There is no mass.     Tenderness: There is no abdominal tenderness. There is no guarding or rebound.   Musculoskeletal:        General: Tenderness present. Normal range of motion.     Cervical back: Normal range of motion.     Right lower leg: Edema present.     Left lower leg: No edema.  Lymphadenopathy:     Cervical: No cervical adenopathy.   Skin:    General: Skin is warm and dry.     Findings: Rash present.   Neurological:     Mental Status: He is alert and oriented to person, place, and time.     Cranial Nerves: No cranial nerve deficit.     Motor: No abnormal muscle tone.     Coordination: Coordination normal.     Gait: Gait abnormal.     Deep Tendon Reflexes: Reflexes are normal and symmetric.   Psychiatric:        Behavior: Behavior normal.        Thought Content: Thought content normal.        Judgment: Judgment normal.   R heel w/pain R leg w/trace edema 2 small rash elements - vesicles  Lab Results  Component Value Date   WBC 5.9 09/06/2023   HGB 14.6 04/18/2024   HCT 43.0 04/18/2024   PLT 295 09/06/2023   GLUCOSE 85 04/18/2024   CHOL 158 03/20/2023    TRIG 52.0 03/20/2023   HDL 56.30 03/20/2023   LDLCALC 91 03/20/2023   ALT 21 09/06/2023   AST 22 09/06/2023   NA 141 04/18/2024   K 4.2 04/18/2024   CL 105 04/18/2024   CREATININE 0.90 04/18/2024   BUN 10 04/18/2024   CO2 26 09/06/2023   TSH 1.97 08/22/2023   PSA 0.46 03/20/2023   INR 1.08 08/23/2010  HGBA1C 5.6 08/22/2023    DG Foot Complete Right Result Date: 04/18/2024 CLINICAL DATA:  Worsening chronic bilateral foot pain. EXAM: RIGHT FOOT COMPLETE - 3+ VIEW COMPARISON:  None Available. FINDINGS: There is no evidence of fracture or dislocation. There is no evidence of arthropathy or other focal bone abnormality. Soft tissues are unremarkable. IMPRESSION: Negative. Electronically Signed   By: Virgle Grime M.D.   On: 04/18/2024 10:13   DG Foot Complete Left Result Date: 04/18/2024 CLINICAL DATA:  Chronic bilateral foot pain. EXAM: LEFT FOOT - COMPLETE 3+ VIEW COMPARISON:  None Available. FINDINGS: The fifth left toe is flexed and subsequently limited in evaluation. There is no evidence of fracture or dislocation. There is no evidence of arthropathy or other focal bone abnormality. Soft tissues are unremarkable. IMPRESSION: Negative. Electronically Signed   By: Virgle Grime M.D.   On: 04/18/2024 10:10    Assessment & Plan:   Problem List Items Addressed This Visit     Plantar fasciitis   R>>L  Ice Arch supports Wt loss      Relevant Orders   CBC with Differential/Platelet   Comprehensive metabolic panel with GFR   TSH   Urinalysis   Rash and nonspecific skin eruption - Primary   Johanna saw a dermatologist, but he did not have rash at the time - he was told to return w/rash. Relapsing now      Relevant Orders   QuantiFERON-TB Gold Plus   Bladder neck obstruction   Prostatism Start Cialis  5 mg/d UA Labs w/PSA      Relevant Orders   CBC with Differential/Platelet   Comprehensive metabolic panel with GFR   Urinalysis   PSA   Edema leg   Chronic  lymphedema R leg >20 years Check TB test      Relevant Orders   QuantiFERON-TB Gold Plus   TSH      Meds ordered this encounter  Medications   tadalafil  (CIALIS ) 5 MG tablet    Sig: Take 1 tablet (5 mg total) by mouth daily.    Dispense:  30 tablet    Refill:  11   ondansetron  (ZOFRAN ) 4 MG tablet    Sig: Take 1 tablet (4 mg total) by mouth every 8 (eight) hours as needed for nausea or vomiting.    Dispense:  20 tablet    Refill:  0      Follow-up: Return in about 3 months (around 07/23/2024) for a follow-up visit.  Anitra Barn, MD

## 2024-04-22 NOTE — Assessment & Plan Note (Signed)
 R>>L  Ice Arch supports Wt loss

## 2024-04-22 NOTE — Assessment & Plan Note (Addendum)
 Chronic lymphedema R leg >20 years Check TB test

## 2024-04-22 NOTE — Assessment & Plan Note (Addendum)
 Chad Spears saw a dermatologist, but he did not have rash at the time - he was told to return w/rash. Relapsing now Continue with topical triamcinolone  as needed

## 2024-04-22 NOTE — Assessment & Plan Note (Signed)
 Check TB test

## 2024-04-22 NOTE — Assessment & Plan Note (Signed)
 Prostatism Start Cialis  5 mg/d UA Labs w/PSA

## 2024-04-23 LAB — URINALYSIS
Hgb urine dipstick: NEGATIVE
Ketones, ur: NEGATIVE
Leukocytes,Ua: NEGATIVE
Nitrite: NEGATIVE
Specific Gravity, Urine: 1.025 (ref 1.000–1.030)
Total Protein, Urine: NEGATIVE
Urine Glucose: NEGATIVE
Urobilinogen, UA: 2 — AB (ref 0.0–1.0)
pH: 6 (ref 5.0–8.0)

## 2024-04-23 LAB — CBC WITH DIFFERENTIAL/PLATELET
Basophils Absolute: 0 10*3/uL (ref 0.0–0.1)
Basophils Relative: 0.3 % (ref 0.0–3.0)
Eosinophils Absolute: 0.4 10*3/uL (ref 0.0–0.7)
Eosinophils Relative: 5.3 % — ABNORMAL HIGH (ref 0.0–5.0)
HCT: 40.1 % (ref 39.0–52.0)
Hemoglobin: 12.9 g/dL — ABNORMAL LOW (ref 13.0–17.0)
Lymphocytes Relative: 31.2 % (ref 12.0–46.0)
Lymphs Abs: 2.6 10*3/uL (ref 0.7–4.0)
MCHC: 32.2 g/dL (ref 30.0–36.0)
MCV: 75.6 fl — ABNORMAL LOW (ref 78.0–100.0)
Monocytes Absolute: 0.7 10*3/uL (ref 0.1–1.0)
Monocytes Relative: 8.2 % (ref 3.0–12.0)
Neutro Abs: 4.6 10*3/uL (ref 1.4–7.7)
Neutrophils Relative %: 55 % (ref 43.0–77.0)
Platelets: 334 10*3/uL (ref 150.0–400.0)
RBC: 5.31 Mil/uL (ref 4.22–5.81)
RDW: 16.5 % — ABNORMAL HIGH (ref 11.5–15.5)
WBC: 8.3 10*3/uL (ref 4.0–10.5)

## 2024-04-23 LAB — COMPREHENSIVE METABOLIC PANEL WITH GFR
ALT: 18 U/L (ref 0–53)
AST: 20 U/L (ref 0–37)
Albumin: 4.2 g/dL (ref 3.5–5.2)
Alkaline Phosphatase: 77 U/L (ref 39–117)
BUN: 11 mg/dL (ref 6–23)
CO2: 29 meq/L (ref 19–32)
Calcium: 9.2 mg/dL (ref 8.4–10.5)
Chloride: 106 meq/L (ref 96–112)
Creatinine, Ser: 0.96 mg/dL (ref 0.40–1.50)
GFR: 95.53 mL/min (ref >60.00)
Glucose, Bld: 109 mg/dL — ABNORMAL HIGH (ref 70–99)
Potassium: 4 meq/L (ref 3.5–5.1)
Sodium: 141 meq/L (ref 135–145)
Total Bilirubin: 0.4 mg/dL (ref 0.2–1.2)
Total Protein: 6.5 g/dL (ref 6.0–8.3)

## 2024-04-24 LAB — QUANTIFERON-TB GOLD PLUS
QuantiFERON Mitogen Value: >10 [IU]/mL
QuantiFERON Nil Value: 0.23 [IU]/mL
QuantiFERON TB1 Ag Value: 1.51 [IU]/mL
QuantiFERON TB2 Ag Value: 1.6 [IU]/mL
QuantiFERON-TB Gold Plus: POSITIVE — AB

## 2024-04-24 LAB — TSH: TSH: 2.63 u[IU]/mL (ref 0.35–5.50)

## 2024-04-24 LAB — SPECIMEN STATUS REPORT

## 2024-04-24 LAB — PSA: PSA: 0.51 ng/mL (ref 0.10–4.00)

## 2024-04-26 ENCOUNTER — Ambulatory Visit (INDEPENDENT_AMBULATORY_CARE_PROVIDER_SITE_OTHER)

## 2024-04-26 ENCOUNTER — Ambulatory Visit: Payer: Self-pay | Admitting: Internal Medicine

## 2024-04-26 DIAGNOSIS — R7612 Nonspecific reaction to cell mediated immunity measurement of gamma interferon antigen response without active tuberculosis: Secondary | ICD-10-CM

## 2024-04-26 NOTE — Progress Notes (Signed)
 I was able to inform the Pt that all his labs were stable except is Tb test (Quantiferon ) and that he needs a Chest x-ray which Pt states he will get done today.

## 2024-04-26 NOTE — Assessment & Plan Note (Signed)
 Obtain chest x-ray ID consult -Dr. Ernie Heal

## 2024-04-28 ENCOUNTER — Telehealth: Payer: Self-pay | Admitting: Internal Medicine

## 2024-04-28 ENCOUNTER — Ambulatory Visit: Payer: Self-pay | Admitting: Internal Medicine

## 2024-04-28 ENCOUNTER — Telehealth: Payer: Self-pay

## 2024-04-28 NOTE — Telephone Encounter (Signed)
 I spoke with patient and informed him that his chest xray was normal.

## 2024-04-28 NOTE — Telephone Encounter (Signed)
 Copied from CRM [id number]. Topic: General - Call Back - No Documentation >> Apr 28, 2024 12:18 PM Allyne Areola wrote: Reason for CRM: Patient was calling back to the office regarding scheduling for a B12 injection; however, no documentation and previous OV do not reflect patient has gotten the B12 injection. Called [facility] and they advised to send a CRM.

## 2024-04-28 NOTE — Telephone Encounter (Signed)
 Copied from CRM [id number]. Topic: General - Call Back - No Documentation >> Apr 28, 2024 12:18 PM Allyne Areola wrote: Reason for CRM: Patient was calling back to the office regarding scheduling for a B12 injection; however, no documentation and previous OV do not reflect patient has gotten the B12 injection. Called [employer] and they advised to send a CRM. >> Apr 28, 2024  1:20 PM [place] J wrote: Patient is returning a call from Dr. Tami Falcon nurse stating that he is able to schedule for a B12 injection. There weren't any notes left from the telephone encounter, but I was able to call and confirm with [facility] that we still are unsure about whether or not those orders are approved. The patient did state that the person who told him he was able to schedule was the same person who relayed his imaging results and he would like a call back with clearer information.

## 2024-04-29 NOTE — Telephone Encounter (Signed)
 He needs to take Vit B complex (OTC) one a day Thx

## 2024-04-29 NOTE — Telephone Encounter (Signed)
 See other message pls Thx

## 2024-05-05 NOTE — Telephone Encounter (Unsigned)
 Copied from CRM [id number]. Topic: Clinical - Medication Question >> May 05, 2024  1:25 PM Chad Spears I wrote: Reason for CRM: Patient would like to know if he can get the B-12 shot instead of taking the OTC Vit B complex

## 2024-05-06 ENCOUNTER — Ambulatory Visit (INDEPENDENT_AMBULATORY_CARE_PROVIDER_SITE_OTHER)

## 2024-05-06 ENCOUNTER — Encounter: Payer: Self-pay | Admitting: Podiatry

## 2024-05-06 ENCOUNTER — Ambulatory Visit (INDEPENDENT_AMBULATORY_CARE_PROVIDER_SITE_OTHER): Admitting: Podiatry

## 2024-05-06 DIAGNOSIS — M722 Plantar fascial fibromatosis: Secondary | ICD-10-CM | POA: Diagnosis not present

## 2024-05-06 MED ORDER — DICLOFENAC SODIUM 75 MG PO TBEC: 75.0000 mg | DELAYED_RELEASE_TABLET | Freq: Two times a day (BID) | ORAL | 2 refills | Status: AC

## 2024-05-06 NOTE — Patient Instructions (Signed)

## 2024-05-07 NOTE — Progress Notes (Signed)
 Subjective:   Patient ID: Chad Spears, male   DOB: 43 y.o.   MRN: [id number]   HPI Patient states he has had several year history of pain in his heels of both feet and he has tried over-the-counter insole and shoe gear modification and is worsened over the last 4 to 6 months right over left.  Patient does not smoke tries to be active   Review of Systems  All other systems reviewed and are negative.       Objective:  Physical Exam Vitals and nursing note reviewed.  Constitutional:      Appearance: He is well-developed.  Pulmonary:     Effort: Pulmonary effort is normal.   Musculoskeletal:        General: Normal range of motion.   Skin:    General: Skin is warm.   Neurological:     Mental Status: He is alert.     Neurovascular status intact muscle strength found to be adequate range of motion within normal limits with exquisite discomfort noted medial band of the fascia right over left with flatfoot deformity noted bilateral and equinus bilateral.  Patient is found to have good digital perfusion well-oriented x 3     Assessment:  Acute plantar fasciitis right over left with inflammation fluid with flatfoot deformity is complicating factor     Plan:  H&P reviewed sterile prep and I went ahead today and I did inject the plantar fascia at insertion bilateral 3 mg Kenalog  5 mg Xylocaine  applied sterile dressings I applied for the right fascial brace to lift up the arch and take pressure off the plantar arch and I placed on oral anti-inflammatory diclofenac  to take twice a day.  Patient to be seen back again all questions answered  X-rays indicate small spur formation no indication stress fracture or arthritis

## 2024-05-10 NOTE — Telephone Encounter (Signed)
 Yes.  Schedule nurse office visit every 2 weeks for vitamin B12 1000 mcg subcutaneously.  Thank you

## 2024-05-12 NOTE — Telephone Encounter (Signed)
 Pt has been sched for 2 bi-weekly B12 injections... How long should pt come in for Bi-Weekly B12 injections?

## 2024-05-13 ENCOUNTER — Ambulatory Visit

## 2024-05-13 DIAGNOSIS — E538 Deficiency of other specified B group vitamins: Secondary | ICD-10-CM

## 2024-05-13 MED ORDER — CYANOCOBALAMIN 1000 MCG/ML IJ SOLN
1000.0000 ug | Freq: Once | INTRAMUSCULAR | Status: AC
  Administered 2024-05-13: 1000 ug via INTRAMUSCULAR

## 2024-05-13 NOTE — Progress Notes (Cosign Needed Addendum)
 After obtaining consent, and per orders of Dr. Garald, injection of B12 given by Ronnald SHAUNNA [place]. Patient instructed to remain in clinic for 20 minutes afterwards, and to report any adverse reaction to me immediately.   Medical screening examination/treatment/procedure(s) were performed by non-physician practitioner and as supervising physician I was immediately available for consultation/collaboration.  I agree with above. Karlynn Garald, MD

## 2024-05-13 NOTE — Telephone Encounter (Signed)
 For 2 months.  Then he can learn how to do injections himself.  If not, we can switch him to a sublingual preparation.  Thanks

## 2024-05-17 NOTE — Telephone Encounter (Signed)
 Per pcp For 2 months.  Then he can learn how to do injections himself.  If not, we can switch him to a sublingual preparation.  Thanks

## 2024-05-20 ENCOUNTER — Ambulatory Visit: Admitting: Podiatry

## 2024-05-20 ENCOUNTER — Encounter: Payer: Self-pay | Admitting: Podiatry

## 2024-05-20 VITALS — Ht 72.0 in | Wt 232.0 lb

## 2024-05-20 DIAGNOSIS — M722 Plantar fascial fibromatosis: Secondary | ICD-10-CM

## 2024-05-20 MED ORDER — TRIAMCINOLONE ACETONIDE 10 MG/ML IJ SUSP
10.0000 mg | Freq: Once | INTRAMUSCULAR | Status: AC
  Administered 2024-05-20: 10 mg via INTRA_ARTICULAR

## 2024-05-21 ENCOUNTER — Other Ambulatory Visit: Payer: Self-pay | Admitting: Internal Medicine

## 2024-05-21 NOTE — Progress Notes (Signed)
 Subjective:   Patient ID: Chad Spears, male   DOB: 43 y.o.   MRN: [id number]   HPI Patient states he still having a lot of pain in his heels and states that the injections have helped somewhat but he also needs something to support as he works extended hours   ROS      Objective:  Physical Exam  Neurovascular status intact with exquisite discomfort still noted medial fascial band bilateral with improvement from before but still very tender with structural deformity of both feet     Assessment:  Continued acute plantar fasciitis bilateral with moderate deformity of both feet noted chronic inflammation of the heel at insertion     Plan:  H&P reviewed condition sterile prep and did inject the plantar fascia at insertion 3 mg Kenalog  5 mg Xylocaine  and then went ahead and the pedorthist evaluated this patient and casted for functional orthotic devices.  Patient will be seen back when those are ready will see me as needed continue oral anti-inflammatories

## 2024-05-24 NOTE — Telephone Encounter (Unsigned)
 Copied from CRM [id number]. Topic: Clinical - Medication Refill >> May 24, 2024 11:43 AM Harlene ORN wrote: Medication: phentermine  (ADIPEX-P ) 37.5 MG tablet methylPREDNISolone  (MEDROL  DOSEPAK) 4 MG TBPK tablet   Has the patient contacted their pharmacy? Yes (Agent: If no, request that the patient contact the pharmacy for the refill. If patient does not wish to contact the pharmacy document the reason why and proceed with request.) (Agent: If yes, when and what did the pharmacy advise?)  This is the patient's preferred pharmacy:  [facility] [address] [place] - [address] [place]  [place] [address] Phone: [phone] Fax: [phone]  Is this the correct pharmacy for this prescription? Yes If no, delete pharmacy and type the correct one.   Has the prescription been filled recently? No  Is the patient out of the medication? Yes  Has the patient been seen for an appointment in the last year OR does the patient have an upcoming appointment? Yes  Can we respond through MyChart? No  Agent: Please be advised that Rx refills may take up to 3 business days. We ask that you follow-up with your pharmacy.

## 2024-05-24 NOTE — Telephone Encounter (Signed)
 Copied from CRM [id number]. Topic: Clinical - Medication Question >> May 24, 2024  8:03 AM Franky GRADE wrote: Reason for CRM: Patient is calling to follow up on a refill request the pharmacy sent to the office for phentermine  (ADIPEX-P ) 37.5 MG tablet [[id number]], hydrOXYzine  HCl 25 &  methylPREDNISolone  , I advised that there is a 3 day turnaround time on refills. Patient is completely out and would like to know if there is a way to expedite the process.

## 2024-05-26 MED ORDER — PHENTERMINE HCL 37.5 MG PO TABS: 37.5000 mg | ORAL_TABLET | Freq: Every day | ORAL | 0 refills | Status: DC

## 2024-05-26 MED ORDER — METHYLPREDNISOLONE 4 MG PO TBPK: ORAL_TABLET | ORAL | 0 refills | Status: AC

## 2024-05-26 NOTE — Telephone Encounter (Signed)
 Okay.  He needs to see a dermatologist as we planned. Thanks

## 2024-05-26 NOTE — Addendum Note (Signed)
 Addended by: Margurete Guaman V on: 05/26/2024 07:39 AM   Modules accepted: Orders

## 2024-05-27 ENCOUNTER — Ambulatory Visit

## 2024-05-28 ENCOUNTER — Ambulatory Visit

## 2024-05-28 DIAGNOSIS — E538 Deficiency of other specified B group vitamins: Secondary | ICD-10-CM

## 2024-05-28 MED ORDER — CYANOCOBALAMIN 1000 MCG/ML IJ SOLN
1000.0000 ug | Freq: Once | INTRAMUSCULAR | Status: AC
  Administered 2024-05-28: 1000 ug via INTRAMUSCULAR

## 2024-05-28 NOTE — Progress Notes (Cosign Needed Addendum)
 Pt here for 2/4 weekly B12 injection per Dr. SHAUNNA   B12 1000mcg given IM and pt tolerated injection well.  Next B12 injection has been scheduled.    Medical screening examination/treatment/procedure(s) were performed by non-physician practitioner and as supervising physician I was immediately available for consultation/collaboration.  I agree with above. Karlynn Noel, MD

## 2024-05-31 ENCOUNTER — Ambulatory Visit (INDEPENDENT_AMBULATORY_CARE_PROVIDER_SITE_OTHER): Admitting: Podiatry

## 2024-05-31 DIAGNOSIS — M722 Plantar fascial fibromatosis: Secondary | ICD-10-CM

## 2024-05-31 MED ORDER — PREDNISONE 10 MG PO TABS
ORAL_TABLET | ORAL | 0 refills | Status: DC
Start: 2024-05-31 — End: 2024-08-09

## 2024-06-01 NOTE — Progress Notes (Signed)
 Subjective:   Patient ID: Chad Spears, male   DOB: 43 y.o.   MRN: [id number]   HPI Patient continues to experience a lot of pain in the heel with the right being much worse than the left and states that the injections only seem to last for period of time.  States has had several year history of this in 1 fashion or another   ROS      Objective:  Physical Exam  Neurovascular status intact continued discomfort in the medial fascial band moderately severe in the right heel with the left doing better currently.     Assessment:  Acute plantar fasciitis which remains right improving on the left     Plan:  H&P done recommended night splint to try to stretch the foot and recommended ice therapy and continue anti-inflammatories we will wait for orthotics ultimately this may require surgery.  At this time night splint is dispensed fitted properly to the lower leg with all instructions on usage

## 2024-06-03 ENCOUNTER — Ambulatory Visit: Admitting: Infectious Diseases

## 2024-06-04 ENCOUNTER — Ambulatory Visit

## 2024-06-04 DIAGNOSIS — E538 Deficiency of other specified B group vitamins: Secondary | ICD-10-CM | POA: Diagnosis not present

## 2024-06-04 MED ORDER — CYANOCOBALAMIN 1000 MCG/ML IJ SOLN
1000.0000 ug | Freq: Once | INTRAMUSCULAR | Status: AC
  Administered 2024-06-04: 1000 ug via INTRAMUSCULAR

## 2024-06-04 NOTE — Progress Notes (Signed)
 Pt here for B12 injection per Dr. Plotnikov  B12 1000mcg given IM and pt tolerated injection well.

## 2024-06-07 ENCOUNTER — Ambulatory Visit (INDEPENDENT_AMBULATORY_CARE_PROVIDER_SITE_OTHER): Admitting: Podiatry

## 2024-06-07 ENCOUNTER — Encounter: Payer: Self-pay | Admitting: Podiatry

## 2024-06-07 DIAGNOSIS — M722 Plantar fascial fibromatosis: Secondary | ICD-10-CM | POA: Diagnosis not present

## 2024-06-08 NOTE — Progress Notes (Signed)
 Subjective:   Patient ID: Chad Spears, male   DOB: 43 y.o.   MRN: [id number]   HPI Patient states both my heels are really killing me right over left and this has been going on now for a couple years has not responded conservatively and I cannot work and the interested in surgery   ROS      Objective:  Physical Exam  Neurovascular status intact good digital perfusion noted with severe discomfort plantar heel region right over left fluid buildup around the medial band extreme pain noted and inability to walk normally.  Failure to respond conservatively to previous treatments done including injections anti-inflammatories and night splint     Assessment:  Appears to be severe plantar fasciitis right over left with patient is not able to work like he should     Plan:  H&P reviewed and at this point I did discuss treatment options and he wants surgery.  I recommended endoscopic release of the medial fascial band and doing 1 foot at a time with the right foot to be done first.  Patient wants surgery understands this and procedure and at this point I went ahead and I allowed him to read then signed consent form understanding risk and recovery and dispensed air fracture walker properly fitted to his lower leg with all instructions on usage and he is scheduled for outpatient surgery at this time

## 2024-06-11 ENCOUNTER — Telehealth: Payer: Self-pay | Admitting: Podiatry

## 2024-06-11 ENCOUNTER — Ambulatory Visit (INDEPENDENT_AMBULATORY_CARE_PROVIDER_SITE_OTHER): Admitting: Radiology

## 2024-06-11 DIAGNOSIS — E538 Deficiency of other specified B group vitamins: Secondary | ICD-10-CM

## 2024-06-11 MED ORDER — CYANOCOBALAMIN 1000 MCG/ML IJ SOLN
1000.0000 ug | Freq: Once | INTRAMUSCULAR | Status: AC
  Administered 2024-06-11: 1000 ug via INTRAMUSCULAR

## 2024-06-11 NOTE — Telephone Encounter (Signed)
 DOS- 06/22/2024  EPF RT- 70106 INJECTION IN HEEL LT- 20550  [employer] EFFECTIVE DATE- 06/12/2023  DEDUCTIBLE- $3500 REMAINING- $0 FAMILY DEDUCTIBLE- $3500 REMAINING- $0 OOP- $6850 REMAINING- $0 FAMILY OOP- $13700 REMAINING- $6550 COINSURANCE- 25%  PER [employer] WEBSITE, NO PRIOR AUTHS ARE REQUIRED FOR CPT CODES 70106 AND 20550. DOCUMENTATION ATTACHED TO CONSENT PACKET

## 2024-06-11 NOTE — Progress Notes (Cosign Needed Addendum)
 Patient here for his last B-12 injection. Patient tolerated well with no complications. Injection was given in left are. He had no further questions.   Medical screening examination/treatment/procedure(s) were performed by non-physician practitioner and as supervising physician I was immediately available for consultation/collaboration.  I agree with above. Karlynn Noel, MD

## 2024-06-17 ENCOUNTER — Ambulatory Visit

## 2024-06-21 ENCOUNTER — Other Ambulatory Visit: Payer: Self-pay | Admitting: Internal Medicine

## 2024-06-21 MED ORDER — HYDROCODONE-ACETAMINOPHEN 10-325 MG PO TABS: 1.0000 | ORAL_TABLET | Freq: Three times a day (TID) | ORAL | 0 refills | Status: AC | PRN

## 2024-06-21 NOTE — Addendum Note (Signed)
 Addended by: MAGDALEN PASCO RAMAN on: 06/21/2024 05:17 PM   Modules accepted: Orders

## 2024-06-22 DIAGNOSIS — M722 Plantar fascial fibromatosis: Secondary | ICD-10-CM | POA: Diagnosis not present

## 2024-06-28 ENCOUNTER — Ambulatory Visit (INDEPENDENT_AMBULATORY_CARE_PROVIDER_SITE_OTHER): Admitting: Podiatry

## 2024-06-28 ENCOUNTER — Other Ambulatory Visit

## 2024-06-28 DIAGNOSIS — Z91199 Patient's noncompliance with other medical treatment and regimen due to unspecified reason: Secondary | ICD-10-CM

## 2024-06-28 NOTE — Progress Notes (Signed)
 No show

## 2024-06-30 ENCOUNTER — Ambulatory Visit

## 2024-06-30 ENCOUNTER — Ambulatory Visit (INDEPENDENT_AMBULATORY_CARE_PROVIDER_SITE_OTHER): Admitting: Podiatry

## 2024-06-30 ENCOUNTER — Encounter: Payer: Self-pay | Admitting: Podiatry

## 2024-06-30 VITALS — BP 126/76 | HR 82 | Temp 98.6°F

## 2024-06-30 DIAGNOSIS — M722 Plantar fascial fibromatosis: Secondary | ICD-10-CM

## 2024-06-30 NOTE — Progress Notes (Signed)
 Patient presents today to pick up custom molded foot orthotics, diagnosed with Plantar Fasciitis by Dr. Magdalen.   Orthotics were dispensed and fit was satisfactory. Reviewed instructions for break-in and wear. Written instructions given to patient.  Patient will follow up as needed.  Lolita Schultze Cped, CFO, CFm

## 2024-06-30 NOTE — Progress Notes (Signed)
 Subjective:  Patient ID: Chad Spears, male    DOB: [birth year],  MRN: [id number]  Chief Complaint  Patient presents with   Routine Post Op    POV # 1 DOS 06/22/24 RT ENDOSCOPIC RELEASE MEDIAL BAND HEEL, INJECTION LEFT HEEL It's doing okay.  My pain level is a three. I have not had any nausea, vomiting, constipation, diarhhea or fever.     DOS: 06/22/24 Procedure: Right endoscopic medial band release, left heel injection.  43 y.o. male returns for POV#1. Doing well and managing pain  Review of Systems: Negative except as noted in the HPI. Denies N/V/F/Ch.  Past Medical History:  Diagnosis Date   Anxiety    Dermatophytosis of foot    Duodenitis    ED (erectile dysfunction)    Headache(784.0)    Hepatitis    patient denies   IBS (irritable bowel syndrome)    Lymphedema    in right leg   Plantar fascial fibromatosis    Psychosomatic disorder    TB (tuberculosis) 11/11/1996   [place], rx again in 2001 (of note he had evidence fo calcified lymph nodes in abdomen as well as some duodenal inflammation)    Current Outpatient Medications:    clotrimazole -betamethasone  (LOTRISONE ) cream, Apply topically 2 (two) times daily. Use for groin rash, Disp: 90 g, Rfl: 1   Emollient (EQ THERAPEUTIC DRY SKIN) CREA, APPLY TOPICALLY AS NEEDED FOR DRY SKIN, Disp: , Rfl:    hydrOXYzine  (ATARAX ) 25 MG tablet, TAKE 1 TABLET BY MOUTH EVERY 8 HOURS AS NEEDED, Disp: 90 tablet, Rfl: 1   loratadine  (CLARITIN ) 10 MG tablet, Take 1 tablet (10 mg total) by mouth daily., Disp: 90 tablet, Rfl: 3   methocarbamol  (ROBAXIN ) 500 MG tablet, Take 1 tablet at bedtime as needed for back spasm/back painTake 1 tablet at bedtime as needed for back spasm/back pain, Disp: 90 tablet, Rfl: 1   ondansetron  (ZOFRAN ) 4 MG tablet, Take 1 tablet (4 mg total) by mouth every 8 (eight) hours as needed for nausea or vomiting., Disp: 20 tablet, Rfl: 0   oxyCODONE  (OXY IR/ROXICODONE ) 5 MG immediate release tablet, Take 1 tablet (5 mg  total) by mouth every 6 (six) hours as needed for moderate pain (pain score 4-6) or severe pain (pain score 7-10)., Disp: 20 tablet, Rfl: 0   phentermine  (ADIPEX-P ) 37.5 MG tablet, Take 1 tablet (37.5 mg total) by mouth daily before breakfast., Disp: 90 tablet, Rfl: 0   Skin Protectants, Misc. (EUCERIN) cream, Apply topically as needed for dry skin., Disp: 454 g, Rfl: 0   tadalafil  (CIALIS ) 5 MG tablet, Take 1 tablet (5 mg total) by mouth daily., Disp: 30 tablet, Rfl: 11   triamcinolone  cream (KENALOG ) 0.1 %, Apply 1 Application topically 3 (three) times daily. On skin rash, Disp: 360 g, Rfl: 3   Vitamin D , Ergocalciferol , (DRISDOL ) 1.25 MG (50000 UNIT) CAPS capsule, TAKE 1 CAPSULE BY MOUTH ONCE EVERY MONTH, Disp: 3 capsule, Rfl: 3   diclofenac  (VOLTAREN ) 75 MG EC tablet, Take 1 tablet (75 mg total) by mouth 2 (two) times daily. (Patient not taking: Reported on 06/30/2024), Disp: 50 tablet, Rfl: 2   methylPREDNISolone  (MEDROL  DOSEPAK) 4 MG TBPK tablet, Take by mouth See admin instructions. (Patient not taking: Reported on 06/30/2024), Disp: 21 tablet, Rfl: 0   predniSONE  (DELTASONE ) 10 MG tablet, 12 day tapering dose (Patient not taking: Reported on 06/30/2024), Disp: 48 tablet, Rfl: 0  Social History   Tobacco Use  Smoking Status Never  Smokeless Tobacco Never  Allergies  Allergen Reactions   Amitiza  [Lubiprostone ]     nausea   Iohexol       Code: HIVES, Desc: PER SYSTEM NOTES/PCC PT IS ALLERGIC TO IV DYE 09/28/10/RM, Onset Date: 88817988    Objective:   Vitals:   06/30/24 1320  BP: 126/76  Pulse: 82  Temp: 98.6 F (37 C)   There is no height or weight on file to calculate BMI. Constitutional Well developed. Well nourished.  Vascular Foot warm and well perfused. Capillary refill normal to all digits.   Neurologic Normal speech. Oriented to person, place, and time. Epicritic sensation to light touch grossly present bilaterally.  Dermatologic Skin healing well without signs of  infection. Skin edges well coapted without signs of infection.  Orthopedic: Tenderness to palpation noted about the surgical site.    Assessment:   1. Plantar fasciitis of right foot    Plan:  Patient was evaluated and treated and all questions answered.  S/p foot surgery right -Progressing as expected post-operatively. -WB Status: WBAT in surgical shoe  -Sutures: intact -Medications: n/a -Foot redressed.   Return in 2 weeks with Dr. Magdalen for recheck.   No follow-ups on file.

## 2024-07-02 ENCOUNTER — Telehealth: Payer: Self-pay | Admitting: Podiatry

## 2024-07-02 NOTE — Telephone Encounter (Signed)
 Recd [employer] forms for pt. Called and left mess on his vmail to call back.

## 2024-07-06 ENCOUNTER — Encounter: Payer: Self-pay | Admitting: Podiatry

## 2024-07-06 ENCOUNTER — Telehealth: Payer: Self-pay | Admitting: Podiatry

## 2024-07-06 DIAGNOSIS — Z0271 Encounter for disability determination: Secondary | ICD-10-CM

## 2024-07-06 NOTE — Telephone Encounter (Signed)
 pt returned call. He will pick up letter/form copies for his record. He needs letter for bank with DOS 06/22/24 and approx RTW 09/20/24. faxed [place] [phone] as well

## 2024-07-09 ENCOUNTER — Telehealth: Payer: Self-pay | Admitting: Lab

## 2024-07-09 NOTE — Telephone Encounter (Signed)
 Patient is in need of pain medication to be sent to pharmacy stating is in pain.

## 2024-07-14 ENCOUNTER — Other Ambulatory Visit: Payer: Self-pay | Admitting: Podiatry

## 2024-07-14 ENCOUNTER — Encounter: Payer: Self-pay | Admitting: Podiatry

## 2024-07-14 ENCOUNTER — Ambulatory Visit (INDEPENDENT_AMBULATORY_CARE_PROVIDER_SITE_OTHER): Admitting: Podiatry

## 2024-07-14 VITALS — Ht 72.0 in | Wt 232.0 lb

## 2024-07-14 DIAGNOSIS — M722 Plantar fascial fibromatosis: Secondary | ICD-10-CM

## 2024-07-14 MED ORDER — HYDROCODONE-ACETAMINOPHEN 10-325 MG PO TABS: 1.0000 | ORAL_TABLET | Freq: Three times a day (TID) | ORAL | 0 refills | Status: AC | PRN

## 2024-07-15 ENCOUNTER — Telehealth: Payer: Self-pay | Admitting: Podiatry

## 2024-07-15 NOTE — Progress Notes (Signed)
 Subjective:   Patient ID: Chad Spears, male   DOB: 43 y.o.   MRN: [id number]   HPI Patient states the right heel is feeling much better and the left heel needs surgery and I would like to get it done as soon as possible   ROS      Objective:  Physical Exam  Neurovascular status intact with the patient found to have exquisite discomfort in the left plantar fascia good healing to the right negative [place]' sign wound edges well coapted stitches intact     Assessment:  Doing well post endoscopic release right heel with pain in the left plantar fascia at insertion     Plan:  H&P reviewed and for right stitch is removed sterile dressing applied ankle compression stocking gradual return to soft shoes and for the left discussed the chronic pain in the left fascia he wants it fixed I allowed him to read and signed consent form for correction he does this and is scheduled for outpatient surgery with all questions answered today.  Understands no guarantee that just because 1 did so well but the other 1 will

## 2024-07-15 NOTE — Telephone Encounter (Signed)
 Pt called back and is wanting to schedule the surgery for January of next year. I have it scheduled for 11/16/24

## 2024-07-15 NOTE — Telephone Encounter (Signed)
 Received surgical consent form and date for surgery was 9/16  Called pt to confirm date is ok with and had to leave a message for him to call back.

## 2024-07-21 ENCOUNTER — Telehealth: Payer: Self-pay | Admitting: Podiatry

## 2024-07-21 ENCOUNTER — Encounter: Payer: Self-pay | Admitting: Podiatry

## 2024-07-21 NOTE — Telephone Encounter (Signed)
 pt lft mess on my vmail. I cld bck and he said he wants to RTW 08/10/24 with accommodations break 10-15 mins every 2 hours per shift. He sd Almira fitzpatrick they didnt get form faxed 8/26 I will refax again

## 2024-07-21 NOTE — Telephone Encounter (Signed)
 pt lft mess on my vmail. I called bk and lft mess on his vmail to call bk

## 2024-07-28 ENCOUNTER — Telehealth: Payer: Self-pay | Admitting: Podiatry

## 2024-07-28 ENCOUNTER — Other Ambulatory Visit: Payer: Self-pay | Admitting: Internal Medicine

## 2024-07-28 NOTE — Telephone Encounter (Signed)
 pft lft mess he needs note for bank. See prev notes. I cld and lft mess on his vmail I can email and he needs to confirm email addr or he can pick up [place] or [place].

## 2024-07-29 ENCOUNTER — Encounter: Payer: Self-pay | Admitting: Internal Medicine

## 2024-07-29 ENCOUNTER — Ambulatory Visit: Admitting: Internal Medicine

## 2024-07-29 VITALS — BP 110/72 | HR 72 | Temp 98.0°F | Ht 72.0 in | Wt 243.4 lb

## 2024-07-29 DIAGNOSIS — M545 Low back pain, unspecified: Secondary | ICD-10-CM

## 2024-07-29 DIAGNOSIS — G8929 Other chronic pain: Secondary | ICD-10-CM

## 2024-07-29 DIAGNOSIS — M79671 Pain in right foot: Secondary | ICD-10-CM | POA: Diagnosis not present

## 2024-07-29 DIAGNOSIS — N528 Other male erectile dysfunction: Secondary | ICD-10-CM

## 2024-07-29 DIAGNOSIS — R21 Rash and other nonspecific skin eruption: Secondary | ICD-10-CM | POA: Diagnosis not present

## 2024-07-29 DIAGNOSIS — Z23 Encounter for immunization: Secondary | ICD-10-CM

## 2024-07-29 MED ORDER — TADALAFIL 20 MG PO TABS: 20.0000 mg | ORAL_TABLET | ORAL | 5 refills | Status: DC | PRN

## 2024-07-29 MED ORDER — HYDROXYZINE HCL 25 MG PO TABS: 25.0000 mg | ORAL_TABLET | Freq: Three times a day (TID) | ORAL | 1 refills | Status: DC | PRN

## 2024-07-29 NOTE — Progress Notes (Signed)
 Subjective:  Patient ID: Chad Spears, male    DOB: May 10, 1981  Age: 43 y.o. MRN: [id number]  CC: Follow-up (Patient wants sometimes he gets dizzy. No other concerns to discuss. States getting dizzy is random. )   HPI Chad Spears presents for dizziness C/o R foot pain - s/p R foot surgery - Dr Magdalen [facility] ED, rash  Outpatient Medications Prior to Visit  Medication Sig Dispense Refill   clotrimazole -betamethasone  (LOTRISONE ) cream Apply topically 2 (two) times daily. Use for groin rash 90 g 1   diclofenac  (VOLTAREN ) 75 MG EC tablet Take 1 tablet (75 mg total) by mouth 2 (two) times daily. 50 tablet 2   Emollient (EQ THERAPEUTIC DRY SKIN) CREA APPLY TOPICALLY AS NEEDED FOR DRY SKIN     loratadine  (CLARITIN ) 10 MG tablet Take 1 tablet (10 mg total) by mouth daily. 90 tablet 3   methocarbamol  (ROBAXIN ) 500 MG tablet Take 1 tablet at bedtime as needed for back spasm/back painTake 1 tablet at bedtime as needed for back spasm/back pain 90 tablet 1   methylPREDNISolone  (MEDROL  DOSEPAK) 4 MG TBPK tablet Take by mouth See admin instructions. 21 tablet 0   ondansetron  (ZOFRAN ) 4 MG tablet Take 1 tablet (4 mg total) by mouth every 8 (eight) hours as needed for nausea or vomiting. 20 tablet 0   oxyCODONE  (OXY IR/ROXICODONE ) 5 MG immediate release tablet Take 1 tablet (5 mg total) by mouth every 6 (six) hours as needed for moderate pain (pain score 4-6) or severe pain (pain score 7-10). 20 tablet 0   phentermine  (ADIPEX-P ) 37.5 MG tablet Take 1 tablet (37.5 mg total) by mouth daily before breakfast. 90 tablet 0   predniSONE  (DELTASONE ) 10 MG tablet 12 day tapering dose 48 tablet 0   Skin Protectants, Misc. (EUCERIN) cream Apply topically as needed for dry skin. 454 g 0   triamcinolone  cream (KENALOG ) 0.1 % Apply 1 Application topically 3 (three) times daily. On skin rash 360 g 3   Vitamin D , Ergocalciferol , (DRISDOL ) 1.25 MG (50000 UNIT) CAPS capsule TAKE 1 CAPSULE BY MOUTH ONCE EVERY MONTH 3 capsule 3    hydrOXYzine  (ATARAX ) 25 MG tablet TAKE 1 TABLET BY MOUTH EVERY 8 HOURS AS NEEDED 90 tablet 1   tadalafil  (CIALIS ) 5 MG tablet Take 1 tablet (5 mg total) by mouth daily. 30 tablet 11   No facility-administered medications prior to visit.    ROS: Review of Systems  Constitutional:  Negative for appetite change, fatigue and unexpected weight change.  HENT:  Negative for congestion, nosebleeds, sneezing, sore throat and trouble swallowing.   Eyes:  Negative for itching and visual disturbance.  Respiratory:  Negative for cough.   Cardiovascular:  Negative for chest pain, palpitations and leg swelling.  Gastrointestinal:  Negative for abdominal distention, blood in stool, diarrhea and nausea.  Genitourinary:  Negative for frequency and hematuria.  Musculoskeletal:  Positive for back pain and gait problem. Negative for joint swelling and neck pain.  Skin:  Positive for rash.  Neurological:  Negative for dizziness, tremors, speech difficulty and weakness.  Psychiatric/Behavioral:  Negative for agitation, dysphoric mood and sleep disturbance. The patient is not nervous/anxious.     Objective:  BP 110/72   Pulse 72   Temp 98 F (36.7 C) (Oral)   Ht 6' (1.829 m)   Wt 243 lb 6.4 oz (110.4 kg)   SpO2 97%   BMI 33.01 kg/m   BP Readings from Last 3 Encounters:  07/29/24 110/72  06/30/24  126/76  04/22/24 108/70    Wt Readings from Last 3 Encounters:  07/29/24 243 lb 6.4 oz (110.4 kg)  07/14/24 232 lb (105.2 kg)  05/20/24 232 lb (105.2 kg)    Physical Exam Constitutional:      General: He is not in acute distress.    Appearance: He is well-developed. He is obese.     Comments: NAD  Eyes:     Conjunctiva/sclera: Conjunctivae normal.     Pupils: Pupils are equal, round, and reactive to light.  Neck:     Thyroid : No thyromegaly.     Vascular: No JVD.  Cardiovascular:     Rate and Rhythm: Normal rate and regular rhythm.     Heart sounds: Normal heart sounds. No murmur heard.     No friction rub. No gallop.  Pulmonary:     Effort: Pulmonary effort is normal. No respiratory distress.     Breath sounds: Normal breath sounds. No wheezing or rales.  Chest:     Chest wall: No tenderness.  Abdominal:     General: Bowel sounds are normal. There is no distension.     Palpations: Abdomen is soft. There is no mass.     Tenderness: There is no abdominal tenderness. There is no guarding or rebound.  Musculoskeletal:        General: No tenderness. Normal range of motion.     Cervical back: Normal range of motion.     Right lower leg: No edema.     Left lower leg: No edema.  Lymphadenopathy:     Cervical: No cervical adenopathy.  Skin:    General: Skin is warm and dry.     Findings: Rash present.  Neurological:     Mental Status: He is alert and oriented to person, place, and time.     Cranial Nerves: No cranial nerve deficit.     Motor: No abnormal muscle tone.     Coordination: Coordination normal.     Gait: Gait normal.     Deep Tendon Reflexes: Reflexes are normal and symmetric.  Psychiatric:        Behavior: Behavior normal.        Thought Content: Thought content normal.        Judgment: Judgment normal.    Scattered previous rash elements and smaller numbers He is wearing a surgical boot on the right foot Lab Results  Component Value Date   WBC 8.3 04/22/2024   HGB 12.9 (L) 04/22/2024   HCT 40.1 04/22/2024   PLT 334.0 04/22/2024   GLUCOSE 109 (H) 04/22/2024   CHOL 158 03/20/2023   TRIG 52.0 03/20/2023   HDL 56.30 03/20/2023   LDLCALC 91 03/20/2023   ALT 18 04/22/2024   AST 20 04/22/2024   NA 141 04/22/2024   K 4.0 04/22/2024   CL 106 04/22/2024   CREATININE 0.96 04/22/2024   BUN 11 04/22/2024   CO2 29 04/22/2024   TSH 2.63 04/22/2024   PSA 0.51 04/22/2024   INR 1.08 08/23/2010   HGBA1C 5.6 08/22/2023    DG Foot Complete Right Result Date: 04/18/2024 CLINICAL DATA:  Worsening chronic bilateral foot pain. EXAM: RIGHT FOOT COMPLETE - 3+ VIEW  COMPARISON:  None Available. FINDINGS: There is no evidence of fracture or dislocation. There is no evidence of arthropathy or other focal bone abnormality. Soft tissues are unremarkable. IMPRESSION: Negative. Electronically Signed   By: Suzen Dials M.D.   On: 04/18/2024 10:13   DG Foot Complete Left Result Date:  04/18/2024 CLINICAL DATA:  Chronic bilateral foot pain. EXAM: LEFT FOOT - COMPLETE 3+ VIEW COMPARISON:  None Available. FINDINGS: The fifth left toe is flexed and subsequently limited in evaluation. There is no evidence of fracture or dislocation. There is no evidence of arthropathy or other focal bone abnormality. Soft tissues are unremarkable. IMPRESSION: Negative. Electronically Signed   By: Suzen Dials M.D.   On: 04/18/2024 10:10    Assessment & Plan:   Problem List Items Addressed This Visit     Erectile dysfunction   Cont on Revatio  prn -discontinue Start tadalafil  20 mg as needed      Foot pain, right   Status post surgery-Dr. Magdalen      Low back pain   Chronic.  Musculoskeletal.  Use Aleve  as needed      Rash and nonspecific skin eruption   Nollan saw a dermatologist, but he did not have rash at the time - he was told to return w/rash. Relapsing now Continue with topical triamcinolone  as needed Follow-up with dermatology       Other Visit Diagnoses       Immunization due    -  Primary   Relevant Orders   Flu vaccine trivalent PF, 6mos and older(Flulaval,Afluria,Fluarix,Fluzone) (Completed)         Meds ordered this encounter  Medications   tadalafil  (CIALIS ) 20 MG tablet    Sig: Take 1 tablet (20 mg total) by mouth every other day as needed for erectile dysfunction.    Dispense:  12 tablet    Refill:  5   hydrOXYzine  (ATARAX ) 25 MG tablet    Sig: Take 1 tablet (25 mg total) by mouth every 8 (eight) hours as needed.    Dispense:  90 tablet    Refill:  1      Follow-up: Return in about 3 months (around 10/28/2024) for a follow-up  visit.  Marolyn Noel, MD

## 2024-07-29 NOTE — Assessment & Plan Note (Addendum)
 Cont on Revatio  prn -discontinue Start tadalafil  20 mg as needed

## 2024-07-30 ENCOUNTER — Telehealth: Payer: Self-pay

## 2024-07-30 NOTE — Telephone Encounter (Signed)
 Copied from CRM [id number]. Topic: Clinical - Prescription Issue >> Jul 30, 2024 12:58 PM Emylou G wrote: Reason for CRM: Patient called.. shows denial of  hydrOXYzine  (ATARAX ) 25 MG tablet  refill.. Can we verify refill is too soon?  Was told at the appt that he would get refill was seen 9/18

## 2024-08-01 DIAGNOSIS — M79671 Pain in right foot: Secondary | ICD-10-CM | POA: Insufficient documentation

## 2024-08-01 NOTE — Assessment & Plan Note (Signed)
 Chronic.  Musculoskeletal.  Use Aleve  as needed

## 2024-08-01 NOTE — Assessment & Plan Note (Signed)
 Daymen saw a dermatologist, but he did not have rash at the time - he was told to return w/rash. Relapsing now Continue with topical triamcinolone  as needed Follow-up with dermatology

## 2024-08-01 NOTE — Assessment & Plan Note (Signed)
 Status post surgery-Dr. Magdalen

## 2024-08-02 NOTE — Telephone Encounter (Signed)
 Refill [facility] sent in 07/29/2024

## 2024-08-03 NOTE — Progress Notes (Unsigned)
 Reason for Infectious Disease Consult: + quantiferon gold in patient with chronic lymphedema  Requesting Physician: Karlynn Noel, MD  Subjective:    Patient ID: Chad Spears, male    DOB: [birth year], 43 y.o.   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  Seen in 2001 by my partner Dr. Elaine and not found to have active TB but rather chronic lymphedema. \ I first saw him today the search for infectious ease notes turned up nothing but it turns out when I expanded the search that I found multiple notes by Dr. Eben the note by Dr. Elaine and multiple notes by myself as I saw the patient between 08/2010 and 2012.  To the concerns about residual TBI I [facility] for [facility]al lymph node biopsy but was not want to do excisional lymph node biopsy due to concerns of exacerbating his lymphedema.  Had an FNA which showed benign reactive tissue without granulomas.  Actually also did perform a biopsy of some lesions on his foot on September 10, 2010.  This showed acral skin with epidermal basal cell pigmentation but no atypia or malignancy and PAS stain that was negative, have an elevated angiotensin-converting enzyme of 59  and we contemplated potential diagnosis of sarcoidosis.  Thinking this might be what was going on with him at the time in terms of what was driving his skin pathology and his lymphedema I initiated him on a course of [place] prednisone  and November 28, 2010.  Not seen him since then but he is continue to suffer from lymphedema as well as foot pain right worse than the left he also continues to have these cutaneous lesions of unknown etiology it  appears.  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  Past Medical History:  Diagnosis Date   Anxiety    Dermatophytosis of foot    Duodenitis    ED (erectile dysfunction)    Headache(784.0)    Hepatitis    patient denies   IBS (irritable bowel syndrome)    Lymphedema    in right leg   Plantar fascial fibromatosis    Psychosomatic disorder    TB (tuberculosis) 11/11/1996   [place], rx again in 2001 (of note he had evidence fo calcified lymph nodes in abdomen as well as some duodenal inflammation)    Past Surgical History:  Procedure Laterality Date   EXCISION MASS NECK N/A 02/19/2024   Procedure: EXCISION POSTERIOR NECK MASS AND SKIN LESION;  Surgeon: Vernetta Berg, MD;  Location: [facility];  Service: General;  Laterality: N/A;  Excision posterior neck mass   MASS EXCISION  2012   back of neck   right foot surgery     completed in [place]   right groin     ?  TB infection surgery- remote completed in [place]   TONSILLECTOMY      Family History  Family history unknown: Yes      Social History   Socioeconomic History   Marital status: Married    Spouse name: Not on file   Number of children: Not on file   Years of education: Not on file   Highest education level: Not on file  Occupational History   Not on file  Tobacco Use   Smoking status: Never   Smokeless tobacco: Never  Vaping Use   Vaping status: Never Used  Substance and Sexual Activity   Alcohol use: No   Drug use: No   Sexual activity: Yes  Other Topics Concern   Not on file  Social History Narrative   Not on file    Social Drivers of Health   Financial Resource Strain: Not on file  Food Insecurity: Not on file  Transportation Needs: Not on file  Physical Activity: Not on file  Stress: Not on file  Social Connections: Not on file    Allergies  Allergen Reactions   Amitiza  [Lubiprostone ]     nausea   Iohexol       Code: HIVES, Desc: PER SYSTEM NOTES/PCC PT IS ALLERGIC TO IV DYE 09/28/10/RM, Onset Date: [id number]      Current Outpatient Medications:    clotrimazole -betamethasone  (LOTRISONE ) cream, Apply topically 2 (two) times daily. Use for groin rash, Disp: 90 g, Rfl: 1   diclofenac  (VOLTAREN ) 75 MG EC tablet, Take 1 tablet (75 mg total) by mouth 2 (two) times daily., Disp: 50 tablet, Rfl: 2   Emollient (EQ THERAPEUTIC DRY SKIN) CREA, APPLY TOPICALLY AS NEEDED FOR DRY SKIN, Disp: , Rfl:    hydrOXYzine  (ATARAX ) 25 MG tablet, Take 1 tablet (25 mg total) by mouth every 8 (eight) hours as needed., Disp: 90 tablet, Rfl: 1   loratadine  (CLARITIN ) 10 MG tablet, Take 1 tablet (10 mg total) by mouth daily., Disp: 90 tablet, Rfl: 3   methocarbamol  (ROBAXIN ) 500 MG tablet, Take 1 tablet at bedtime as needed for back spasm/back painTake 1 tablet at bedtime as needed for back spasm/back pain, Disp: 90 tablet, Rfl: 1   methylPREDNISolone  (MEDROL  DOSEPAK) 4 MG TBPK tablet, Take by mouth See admin instructions., Disp: 21 tablet, Rfl: 0   ondansetron  (ZOFRAN ) 4 MG tablet, Take 1 tablet (4 mg total) by mouth every 8 (eight) hours as needed for nausea or vomiting., Disp: 20 tablet, Rfl: 0   oxyCODONE  (OXY IR/ROXICODONE ) 5 MG immediate release tablet, Take 1 tablet (5 mg total) by mouth every 6 (six) hours as needed for moderate pain (pain score 4-6) or severe pain (pain score 7-10)., Disp: 20 tablet, Rfl: 0   phentermine  (ADIPEX-P ) 37.5 MG tablet, Take 1 tablet (37.5 mg total) by mouth daily before breakfast., Disp: 90 tablet, Rfl: 0   predniSONE  (DELTASONE ) 10 MG tablet, 12 day tapering dose, Disp: 48 tablet, Rfl:  0   Skin Protectants, Misc. (EUCERIN) cream, Apply topically as needed for dry skin., Disp: 454 g, Rfl: 0   tadalafil  (CIALIS ) 20 MG tablet, Take 1 tablet (20 mg total) by mouth every other day as needed for erectile dysfunction., Disp: 12 tablet, Rfl: 5   triamcinolone  cream (KENALOG ) 0.1 %, Apply 1 Application topically 3 (three) times daily. On skin rash, Disp: 360 g, Rfl: 3   Vitamin D , Ergocalciferol , (DRISDOL ) 1.25 MG (50000 UNIT) CAPS capsule, TAKE 1 CAPSULE BY MOUTH ONCE EVERY MONTH, Disp: 3 capsule, Rfl:  3   Review of Systems  Constitutional:  Negative for activity change, appetite change, chills, diaphoresis, fatigue, fever and unexpected weight change.  HENT:  Negative for congestion, rhinorrhea, sinus pressure, sneezing, sore throat and trouble swallowing.   Eyes:  Negative for photophobia and visual disturbance.  Respiratory:  Negative for cough, chest tightness, shortness of breath, wheezing and stridor.   Cardiovascular:  Positive for leg swelling. Negative for chest pain and palpitations.  Gastrointestinal:  Negative for abdominal distention, abdominal pain, anal bleeding, blood in stool, constipation, diarrhea, nausea and vomiting.  Genitourinary:  Negative for difficulty urinating, dysuria, flank pain and hematuria.  Musculoskeletal:  Negative for arthralgias, back pain, gait problem, joint swelling and myalgias.  Skin:  Positive for rash. Negative for color change, pallor and wound.  Neurological:  Negative for dizziness, tremors, weakness and light-headedness.  Hematological:  Negative for adenopathy. Does not bruise/bleed easily.  Psychiatric/Behavioral:  Negative for agitation, behavioral problems, confusion, decreased concentration, dysphoric mood and sleep disturbance.        Objective:   Physical Exam Constitutional:      Appearance: He is well-developed.  HENT:     Head: Normocephalic and atraumatic.  Eyes:     Conjunctiva/sclera: Conjunctivae normal.   Cardiovascular:     Rate and Rhythm: Normal rate and regular rhythm.  Pulmonary:     Effort: Pulmonary effort is normal. No respiratory distress.     Breath sounds: No wheezing.  Abdominal:     General: There is no distension.     Palpations: Abdomen is soft.  Musculoskeletal:        General: No tenderness. Normal range of motion.     Cervical back: Normal range of motion and neck supple.  Skin:    General: Skin is warm and dry.     Coloration: Skin is not pale.     Findings: No erythema or rash.  Neurological:     General: No focal deficit present.     Mental Status: He is alert and oriented to person, place, and time.  Psychiatric:        Mood and Affect: Mood normal.        Behavior: Behavior normal.        Thought Content: Thought content normal.        Judgment: Judgment normal.    Rash             Assessment & Plan:   Hx of treated TB in [place]:  He has already been worked up for recurrence of TB--which is NOT at all likely with LN biopsy, skin biopsiesin 2011 and 2012  All of his imaging including MRI of the foot, CT of the lower extremity that calcified lymph nodes are all consistent with treated and resolved tuberculosis.  He should not have any further QuantiFERON gold testing done as these this test should be positive for the rest of his life and the reason for doing it is not clear to me.  Lymphedema and skin lesions: When I saw him more than a decade ago I had concerns he might have sarcoidosis due to a slightly elevated angiotensin-converting enzyme level.  This could be considered again I think he needs a formal evaluation by dermatology if this has not yet been done.  In the interim we will screen him for HIV syphilis as well  Hepatitis B isolated core antibody I will check hep B DNA level.  I have personally spent 60 minutes involved in face-to-face and non-face-to-face  activities for this patient on the day of the visit. Professional time spent  includes the following activities: Preparing to see the patient (review of tests), Obtaining and/or reviewing separately obtained history (admission/discharge record), Performing a medically appropriate examination and/or evaluation , Ordering medications/tests/procedures, referring and communicating with other health care professionals, Documenting clinical information in the EMR, Independently interpreting results (not separately reported), Communicating results to the patient/family/caregiver, Counseling and educating the patient/family/caregiver and Care coordination (not separately reported).   He can returns to clinic PRN.

## 2024-08-04 ENCOUNTER — Ambulatory Visit (INDEPENDENT_AMBULATORY_CARE_PROVIDER_SITE_OTHER): Admitting: Infectious Disease

## 2024-08-04 ENCOUNTER — Encounter: Payer: Self-pay | Admitting: Infectious Disease

## 2024-08-04 ENCOUNTER — Other Ambulatory Visit: Payer: Self-pay

## 2024-08-04 VITALS — BP 117/78 | HR 88 | Temp 98.4°F | Ht 72.0 in | Wt 239.0 lb

## 2024-08-04 DIAGNOSIS — R768 Other specified abnormal immunological findings in serum: Secondary | ICD-10-CM | POA: Diagnosis not present

## 2024-08-04 DIAGNOSIS — A182 Tuberculous peripheral lymphadenopathy: Secondary | ICD-10-CM | POA: Diagnosis not present

## 2024-08-04 DIAGNOSIS — R21 Rash and other nonspecific skin eruption: Secondary | ICD-10-CM

## 2024-08-04 DIAGNOSIS — A1803 Tuberculosis of other bones: Secondary | ICD-10-CM | POA: Diagnosis not present

## 2024-08-04 DIAGNOSIS — I890 Lymphedema, not elsewhere classified: Secondary | ICD-10-CM

## 2024-08-04 DIAGNOSIS — D869 Sarcoidosis, unspecified: Secondary | ICD-10-CM

## 2024-08-07 LAB — HEPATITIS B DNA, ULTRAQUANTITATIVE, PCR
Hepatitis B DNA: NOT DETECTED [IU]/mL
Hepatitis B virus DNA: NOT DETECTED {Log_IU}/mL

## 2024-08-07 LAB — HIV ANTIBODY (ROUTINE TESTING W REFLEX)
HIV 1&2 Ab, 4th Generation: NONREACTIVE
HIV FINAL INTERPRETATION: NEGATIVE

## 2024-08-07 LAB — RPR: RPR Ser Ql: NONREACTIVE

## 2024-08-09 ENCOUNTER — Other Ambulatory Visit: Payer: Self-pay

## 2024-08-09 ENCOUNTER — Telehealth: Payer: Self-pay | Admitting: Lab

## 2024-08-09 ENCOUNTER — Other Ambulatory Visit: Payer: Self-pay | Admitting: Podiatry

## 2024-08-09 ENCOUNTER — Other Ambulatory Visit: Payer: Self-pay | Admitting: Internal Medicine

## 2024-08-09 ENCOUNTER — Telehealth: Payer: Self-pay | Admitting: Infectious Disease

## 2024-08-09 MED ORDER — HYDROCODONE-ACETAMINOPHEN 10-325 MG PO TABS: 1.0000 | ORAL_TABLET | Freq: Three times a day (TID) | ORAL | 0 refills | Status: AC | PRN

## 2024-08-09 MED ORDER — PREDNISONE 10 MG PO TABS: ORAL_TABLET | ORAL | 0 refills | Status: DC

## 2024-08-09 NOTE — Telephone Encounter (Signed)
 [facility] called requesting lab results. Pt can be reached at [phone].

## 2024-08-09 NOTE — Telephone Encounter (Signed)
 Patient is requesting prednisone  10 mg refill and hydrocodone  refill please review and send.

## 2024-08-09 NOTE — Telephone Encounter (Signed)
 Attempted to return patients call. No answer.  Chad Spears

## 2024-08-10 ENCOUNTER — Telehealth: Payer: Self-pay | Admitting: Infectious Disease

## 2024-08-10 NOTE — Telephone Encounter (Signed)
 Chad Spears called requesting a copy of his recent results mailed to him. Pt confirmed address as: [address] [place] [address]. Pt can be reached at  [phone], if needed.

## 2024-08-10 NOTE — Telephone Encounter (Signed)
 Results printed and placed in outgoing mail basket.

## 2024-08-10 NOTE — Telephone Encounter (Signed)
 Left message refill sent.

## 2024-08-11 ENCOUNTER — Telehealth: Payer: Self-pay | Admitting: Lab

## 2024-08-11 NOTE — Telephone Encounter (Signed)
 I have figured out issue with medication please disregard message .

## 2024-08-11 NOTE — Telephone Encounter (Signed)
 Patient states his pain medication did not go through to pharmacy that was ordered on 08/09/2024. Please review he states in severe pain and hasn't slept in two days now having trouble going to work.

## 2024-08-12 ENCOUNTER — Telehealth: Payer: Self-pay | Admitting: Podiatry

## 2024-08-12 NOTE — Telephone Encounter (Signed)
 Recd [place] forms about accommodation. See prev notes of forms sent in Sept I faxed [phone]  No charge for this form, since similar to prev sent. Sent note with form.

## 2024-08-17 ENCOUNTER — Telehealth: Payer: Self-pay | Admitting: Podiatry

## 2024-08-17 NOTE — Telephone Encounter (Signed)
 Patient is requesting a copy of the surgery bill to review insurance coverage and determine the remaining balance. He would like to pick up the copy from the front desk. Please call the patient when the bill is ready for pick-up.

## 2024-08-18 ENCOUNTER — Ambulatory Visit: Admitting: Internal Medicine

## 2024-08-18 ENCOUNTER — Encounter: Payer: Self-pay | Admitting: Internal Medicine

## 2024-08-18 VITALS — BP 154/96 | HR 102 | Temp 98.3°F | Ht 72.0 in | Wt 248.0 lb

## 2024-08-18 DIAGNOSIS — R7612 Nonspecific reaction to cell mediated immunity measurement of gamma interferon antigen response without active tuberculosis: Secondary | ICD-10-CM | POA: Diagnosis not present

## 2024-08-18 DIAGNOSIS — R21 Rash and other nonspecific skin eruption: Secondary | ICD-10-CM | POA: Diagnosis not present

## 2024-08-18 DIAGNOSIS — R0609 Other forms of dyspnea: Secondary | ICD-10-CM | POA: Diagnosis not present

## 2024-08-18 DIAGNOSIS — R739 Hyperglycemia, unspecified: Secondary | ICD-10-CM

## 2024-08-18 DIAGNOSIS — R635 Abnormal weight gain: Secondary | ICD-10-CM | POA: Diagnosis not present

## 2024-08-18 LAB — COMPREHENSIVE METABOLIC PANEL WITH GFR
ALT: 22 U/L (ref 0–53)
AST: 17 U/L (ref 0–37)
Albumin: 4.2 g/dL (ref 3.5–5.2)
Alkaline Phosphatase: 77 U/L (ref 39–117)
BUN: 12 mg/dL (ref 6–23)
CO2: 27 meq/L (ref 19–32)
Calcium: 9.3 mg/dL (ref 8.4–10.5)
Chloride: 105 meq/L (ref 96–112)
Creatinine, Ser: 0.88 mg/dL (ref 0.40–1.50)
GFR: 103.7 mL/min (ref >60.00)
Glucose, Bld: 115 mg/dL — ABNORMAL HIGH (ref 70–99)
Potassium: 4.3 meq/L (ref 3.5–5.1)
Sodium: 139 meq/L (ref 135–145)
Total Bilirubin: 0.3 mg/dL (ref 0.2–1.2)
Total Protein: 7 g/dL (ref 6.0–8.3)

## 2024-08-18 LAB — HEMOGLOBIN A1C: Hgb A1c MFr Bld: 6 % (ref 4.6–6.5)

## 2024-08-18 LAB — T4, FREE: Free T4: 0.78 ng/dL (ref 0.60–1.60)

## 2024-08-18 MED ORDER — TRIAMCINOLONE ACETONIDE 0.1 % EX CREA: 1.0000 | TOPICAL_CREAM | Freq: Three times a day (TID) | CUTANEOUS | 3 refills | Status: AC

## 2024-08-18 MED ORDER — PHENTERMINE HCL 37.5 MG PO TABS: 37.5000 mg | ORAL_TABLET | Freq: Every day | ORAL | 0 refills | Status: DC

## 2024-08-18 NOTE — Assessment & Plan Note (Signed)
 Per Dr Fleeta Rothman -   Hx of treated TB in [place]:   He has already been worked up for recurrence of TB--which is NOT at all likely with LN biopsy, skin biopsiesin 2011 and 2012   All of his imaging including MRI of the foot, CT of the lower extremity that calcified lymph nodes are all consistent with treated and resolved tuberculosis.   He should not have any further QuantiFERON gold testing done as these this test should be positive for the rest of his life and the reason for doing it is not clear to me.   Lymphedema and skin lesions: When I saw him more than a decade ago I had concerns he might have sarcoidosis due to a slightly elevated angiotensin-converting enzyme level.  This could be considered again I think he needs a formal evaluation by dermatology if this has not yet been done.   In the interim we will screen him for HIV syphilis as well   Hepatitis B isolated core antibody I will check hep B DNA level.

## 2024-08-18 NOTE — Assessment & Plan Note (Signed)
 Chad Spears is seeing a dermatologist, but he did not have rash at the time - he was told to return w/rash.

## 2024-08-18 NOTE — Progress Notes (Signed)
 Subjective:  Patient ID: Chad Spears, male    DOB: [birth year]  Age: 43 y.o. MRN: [id number]  CC: No chief complaint on file.   HPI Chad Spears presents for DO, wt gain, sweats C/o DOE  Outpatient Medications Prior to Visit  Medication Sig Dispense Refill   clotrimazole -betamethasone  (LOTRISONE ) cream Apply topically 2 (two) times daily. Use for groin rash 90 g 1   diclofenac  (VOLTAREN ) 75 MG EC tablet Take 1 tablet (75 mg total) by mouth 2 (two) times daily. 50 tablet 2   Emollient (EQ THERAPEUTIC DRY SKIN) CREA APPLY TOPICALLY AS NEEDED FOR DRY SKIN     hydrOXYzine  (ATARAX ) 25 MG tablet Take 1 tablet (25 mg total) by mouth every 8 (eight) hours as needed. 90 tablet 1   loratadine  (CLARITIN ) 10 MG tablet Take 1 tablet (10 mg total) by mouth daily. 90 tablet 3   methocarbamol  (ROBAXIN ) 500 MG tablet Take 1 tablet at bedtime as needed for back spasm/back painTake 1 tablet at bedtime as needed for back spasm/back pain 90 tablet 1   methylPREDNISolone  (MEDROL  DOSEPAK) 4 MG TBPK tablet Take by mouth See admin instructions. 21 tablet 0   ondansetron  (ZOFRAN ) 4 MG tablet TAKE 1 TABLET BY MOUTH EVERY 8 HOURS AS NEEDED FOR NAUSEA FOR VOMITING 20 tablet 0   oxyCODONE  (OXY IR/ROXICODONE ) 5 MG immediate release tablet Take 1 tablet (5 mg total) by mouth every 6 (six) hours as needed for moderate pain (pain score 4-6) or severe pain (pain score 7-10). 20 tablet 0   predniSONE  (DELTASONE ) 10 MG tablet 12 day tapering dose 48 tablet 0   Skin Protectants, Misc. (EUCERIN) cream Apply topically as needed for dry skin. 454 g 0   tadalafil  (CIALIS ) 20 MG tablet Take 1 tablet (20 mg total) by mouth every other day as needed for erectile dysfunction. 12 tablet 5   Vitamin D , Ergocalciferol , (DRISDOL ) 1.25 MG (50000 UNIT) CAPS capsule TAKE 1 CAPSULE BY MOUTH ONCE EVERY MONTH 3 capsule 3   phentermine  (ADIPEX-P ) 37.5 MG tablet Take 1 tablet (37.5 mg total) by mouth daily before breakfast. 90 tablet 0    triamcinolone  cream (KENALOG ) 0.1 % Apply 1 Application topically 3 (three) times daily. On skin rash 360 g 3   No facility-administered medications prior to visit.    ROS: Review of Systems  Constitutional:  Positive for fatigue and unexpected weight change. Negative for appetite change.  HENT:  Negative for congestion, nosebleeds, sneezing, sore throat and trouble swallowing.   Eyes:  Negative for itching and visual disturbance.  Respiratory:  Positive for shortness of breath. Negative for cough and wheezing.   Cardiovascular:  Negative for chest pain, palpitations and leg swelling.  Gastrointestinal:  Negative for abdominal distention, blood in stool, diarrhea and nausea.  Genitourinary:  Negative for frequency and hematuria.  Musculoskeletal:  Negative for back pain, gait problem, joint swelling and neck pain.  Skin:  Negative for rash.  Neurological:  Negative for dizziness, tremors, speech difficulty and weakness.  Psychiatric/Behavioral:  Negative for agitation, dysphoric mood and sleep disturbance. The patient is not nervous/anxious.     Objective:  BP (!) 154/96   Pulse (!) 102   Temp 98.3 F (36.8 C) (Temporal)   Ht 6' (1.829 m)   Wt 248 lb (112.5 kg)   SpO2 98%   BMI 33.63 kg/m   BP Readings from Last 3 Encounters:  08/18/24 (!) 154/96  08/04/24 117/78  07/29/24 110/72    Wt Readings  from Last 3 Encounters:  08/18/24 248 lb (112.5 kg)  08/04/24 239 lb (108.4 kg)  07/29/24 243 lb 6.4 oz (110.4 kg)    Physical Exam Constitutional:      General: He is not in acute distress.    Appearance: He is well-developed. He is obese.     Comments: NAD  Eyes:     Conjunctiva/sclera: Conjunctivae normal.     Pupils: Pupils are equal, round, and reactive to light.  Neck:     Thyroid : No thyromegaly.     Vascular: No JVD.  Cardiovascular:     Rate and Rhythm: Normal rate and regular rhythm.     Heart sounds: Normal heart sounds. No murmur heard.    No friction rub.  No gallop.  Pulmonary:     Effort: Pulmonary effort is normal. No respiratory distress.     Breath sounds: Normal breath sounds. No wheezing or rales.  Chest:     Chest wall: No tenderness.  Abdominal:     General: Bowel sounds are normal. There is no distension.     Palpations: Abdomen is soft. There is no mass.     Tenderness: There is no abdominal tenderness. There is no guarding or rebound.  Musculoskeletal:        General: No tenderness. Normal range of motion.     Cervical back: Normal range of motion.     Right lower leg: No edema.     Left lower leg: No edema.  Lymphadenopathy:     Cervical: No cervical adenopathy.  Skin:    General: Skin is warm and dry.     Findings: No bruising or rash.  Neurological:     Mental Status: He is alert and oriented to person, place, and time.     Cranial Nerves: No cranial nerve deficit.     Motor: No abnormal muscle tone.     Coordination: Coordination normal.     Gait: Gait normal.     Deep Tendon Reflexes: Reflexes are normal and symmetric.  Psychiatric:        Behavior: Behavior normal.        Thought Content: Thought content normal.        Judgment: Judgment normal.     Lab Results  Component Value Date   WBC 8.3 04/22/2024   HGB 12.9 (L) 04/22/2024   HCT 40.1 04/22/2024   PLT 334.0 04/22/2024   GLUCOSE 109 (H) 04/22/2024   CHOL 158 03/20/2023   TRIG 52.0 03/20/2023   HDL 56.30 03/20/2023   LDLCALC 91 03/20/2023   ALT 18 04/22/2024   AST 20 04/22/2024   NA 141 04/22/2024   K 4.0 04/22/2024   CL 106 04/22/2024   CREATININE 0.96 04/22/2024   BUN 11 04/22/2024   CO2 29 04/22/2024   TSH 2.63 04/22/2024   PSA 0.51 04/22/2024   INR 1.08 08/23/2010   HGBA1C 5.6 08/22/2023    DG Foot Complete Right Result Date: 04/18/2024 CLINICAL DATA:  Worsening chronic bilateral foot pain. EXAM: RIGHT FOOT COMPLETE - 3+ VIEW COMPARISON:  None Available. FINDINGS: There is no evidence of fracture or dislocation. There is no evidence of  arthropathy or other focal bone abnormality. Soft tissues are unremarkable. IMPRESSION: Negative. Electronically Signed   By: Suzen Dials M.D.   On: 04/18/2024 10:13   DG Foot Complete Left Result Date: 04/18/2024 CLINICAL DATA:  Chronic bilateral foot pain. EXAM: LEFT FOOT - COMPLETE 3+ VIEW COMPARISON:  None Available. FINDINGS: The fifth left  toe is flexed and subsequently limited in evaluation. There is no evidence of fracture or dislocation. There is no evidence of arthropathy or other focal bone abnormality. Soft tissues are unremarkable. IMPRESSION: Negative. Electronically Signed   By: Suzen Dials M.D.   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   He should not have any further QuantiFERON gold testing done as these this test should be positive for the rest of his life and the reason for doing it is not clear to me.   Lymphedema and skin lesions: When I saw him more than a decade ago I had concerns he might have sarcoidosis due to a slightly elevated angiotensin-converting enzyme level.  This could be considered again I think he needs a formal evaluation by dermatology if this has not yet been done.   In the interim we will screen him for HIV syphilis as well   Hepatitis B isolated core antibody I will check hep B DNA level.        DOE (dyspnea on exertion) - Primary   New Wt loss suggested Hold Phentermine  x 1-2 weeks CXR - 2025 ok Will get an ECHO No wheezing      Relevant Orders   ECHOCARDIOGRAM COMPLETE   Skin rash    Shelly is seeing a dermatologist, but he did not have rash at the time - he was told to return w/rash.      Relevant Orders   T4, free   Weight gain   Wt Readings from Last 3 Encounters:  08/18/24 248 lb (112.5 kg)  08/04/24 239 lb (108.4 kg)  07/29/24 243 lb 6.4 oz (110.4 kg)   Phentermine   Potential benefits of a long term adipex use as well as potential risks  and complications were explained to the patient and were aknowledged.      Relevant Orders   T4, free   Other Visit Diagnoses       Hyperglycemia       Relevant Orders   Comprehensive metabolic panel with GFR   Hemoglobin A1c   T4, free         Meds ordered this encounter  Medications   triamcinolone  cream (KENALOG ) 0.1 %    Sig: Apply 1 Application topically 3 (three) times daily. On skin rash    Dispense:  360 g    Refill:  3   phentermine  (ADIPEX-P ) 37.5 MG tablet    Sig: Take 1 tablet (37.5 mg total) by mouth daily before breakfast.    Dispense:  90 tablet    Refill:  0      Follow-up: Return in about 3 months (around 11/18/2024) for a follow-up visit.  Marolyn Noel, MD

## 2024-08-18 NOTE — Assessment & Plan Note (Addendum)
 Wt Readings from Last 3 Encounters:  08/18/24 248 lb (112.5 kg)  08/04/24 239 lb (108.4 kg)  07/29/24 243 lb 6.4 oz (110.4 kg)   Phentermine   Potential benefits of a long term adipex use as well as potential risks  and complications were explained to the patient and were aknowledged.

## 2024-08-18 NOTE — Assessment & Plan Note (Addendum)
 New Wt loss suggested Hold Phentermine  x 1-2 weeks CXR - 2025 ok Will get an ECHO No wheezing

## 2024-08-19 ENCOUNTER — Telehealth: Payer: Self-pay

## 2024-08-19 ENCOUNTER — Ambulatory Visit: Payer: Self-pay | Admitting: Internal Medicine

## 2024-08-19 NOTE — Telephone Encounter (Signed)
 Copied from CRM [id number]. Topic: Clinical - Lab/Test Results >> Aug 19, 2024  9:43 AM Berneda FALCON wrote: Reason for CRM: Patient returning phone call to review lab results. I read the results verbatim from Dr. SHAUNNA. Patient had no additional questions.

## 2024-08-20 ENCOUNTER — Ambulatory Visit (HOSPITAL_COMMUNITY)
Admission: RE | Admit: 2024-08-20 | Discharge: 2024-08-20 | Disposition: A | Discharge status: Home / Self Care | Source: Ambulatory Visit | Attending: Internal Medicine | Admitting: Internal Medicine

## 2024-08-20 DIAGNOSIS — R0609 Other forms of dyspnea: Secondary | ICD-10-CM | POA: Diagnosis present

## 2024-08-20 LAB — ECHOCARDIOGRAM COMPLETE
Area-P 1/2: 3.78 cm2
MV M vel: 5.32 m/s
MV Peak grad: 113.2 mmHg
P 1/2 time: 688 ms
S' Lateral: 3.05 cm

## 2024-08-24 ENCOUNTER — Telehealth: Payer: Self-pay

## 2024-08-24 ENCOUNTER — Other Ambulatory Visit: Payer: Self-pay | Admitting: Internal Medicine

## 2024-08-24 NOTE — Telephone Encounter (Signed)
 Copied from CRM #[id number]. Topic: Clinical - Medication Refill >> Aug 24, 2024  3:53 PM Rosina BIRCH wrote: Medication: oxyCODONE  (OXY IR/ROXICODONE ) 5 MG immediate release tablet  Has the patient contacted their pharmacy? Yes (Agent: If no, request that the patient contact the pharmacy for the refill. If patient does not wish to contact the pharmacy document the reason why and proceed with request.) (Agent: If yes, when and what did the pharmacy advise?)  This is the patient's preferred pharmacy:  [facility] [address], [place] - [address] [place] [address] Phone: [phone] Fax: [phone]  Is this the correct pharmacy for this prescription? Yes If no, delete pharmacy and type the correct one.   Has the prescription been filled recently? No  Is the patient out of the medication? Yes  Has the patient been seen for an appointment in the last year OR does the patient have an upcoming appointment? Yes  Can we respond through MyChart? Yes  Agent: Please be advised that Rx refills may take up to 3 business days. We ask that you follow-up with your pharmacy.

## 2024-08-24 NOTE — Telephone Encounter (Signed)
 Copied from CRM [id number]. Topic: Clinical - Lab/Test Results >> Aug 24, 2024 12:49 PM Ashley R wrote: Reason for CRM: Lorena is looking for a callback on the results for his ECHO @ [phone]

## 2024-08-25 ENCOUNTER — Telehealth: Payer: Self-pay

## 2024-08-25 NOTE — Telephone Encounter (Signed)
 Copied from CRM #[id number]. Topic: Clinical - Medication Refill >> Aug 24, 2024  3:53 PM Rosina BIRCH wrote: Medication: oxyCODONE  (OXY IR/ROXICODONE ) 5 MG immediate release tablet  Has the patient contacted their pharmacy? Yes (Agent: If no, request that the patient contact the pharmacy for the refill. If patient does not wish to contact the pharmacy document the reason why and proceed with request.) (Agent: If yes, when and what did the pharmacy advise?)  This is the patient's preferred pharmacy:  [facility] [address] [place] - [address] [place] [address] Phone: [phone] Fax: [phone]  Is this the correct pharmacy for this prescription? Yes If no, delete pharmacy and type the correct one.   Has the prescription been filled recently? No  Is the patient out of the medication? Yes  Has the patient been seen for an appointment in the last year OR does the patient have an upcoming appointment? Yes  Can we respond through MyChart? Yes  Agent: Please be advised that Rx refills may take up to 3 business days. We ask that you follow-up with your pharmacy. >> Aug 25, 2024 12:21 PM Franky GRADE wrote: Patient is calling to follow up on the refill request, I advised that there is a 3 day turn around time; however, patient is completely out and would like to know if there is anyway to expedite the process.

## 2024-08-25 NOTE — Telephone Encounter (Signed)
 Copied from CRM [id number]. Topic: Clinical - Lab/Test Results >> Aug 25, 2024 12:22 PM Franky GRADE wrote: Reason for CRM: Patient is calling for echocardiogram results for test he had done on 08/20/2024.

## 2024-08-26 ENCOUNTER — Telehealth: Payer: Self-pay | Admitting: Lab

## 2024-08-26 NOTE — Telephone Encounter (Signed)
 I was able to call the pt and LDVM of results.

## 2024-08-26 NOTE — Telephone Encounter (Signed)
 LDVM for pt of the following per PCP Curtistine, Please inform Read that his echocardiogram is overall good.  We should repeat it in a few years.  He needs to lose weight. Thank you

## 2024-08-26 NOTE — Telephone Encounter (Signed)
 Requesting oxyCODONE  (OXY IR/ROXICODONE ) 5 MG immediate release tablet pain medication

## 2024-08-26 NOTE — Telephone Encounter (Signed)
 pt lft mess about another letter needed. I called him bk and lft mess on his vmail I need the actual dates his missed so I can include in his letter.

## 2024-08-27 ENCOUNTER — Telehealth: Payer: Self-pay

## 2024-08-27 ENCOUNTER — Encounter: Payer: Self-pay | Admitting: Podiatry

## 2024-08-27 NOTE — Telephone Encounter (Signed)
 Denied.  Oxycodone  was prescribed by Dr. Vernetta.  Thank you

## 2024-08-27 NOTE — Telephone Encounter (Signed)
 cld pt bk from mess he lft on my vmail to confirm dates he needs note sent to [facility] [phone] via fax and emailed to him. he adv 08/10/24 08/11/24 08/12/24 08/13/24

## 2024-08-27 NOTE — Telephone Encounter (Signed)
>>   Aug 27, 2024  8:51 AM Mia F wrote: Pt is calling back to check the status of the rx refill request. Pt was advised to allow 3 business days for med refills to be completed. Today would be 3 please advise. He is out of meds  >> Aug 25, 2024 12:21 PM Franky GRADE wrote: Patient is calling to follow up on the refill request, I advised that there is a 3 day turn around time; however, patient is completely out and would like to know if there is anyway to expedite the process.  Copied from CRM #[id number]. Topic: Clinical - Medication Refill >> Aug 24, 2024  3:53 PM Rosina BIRCH wrote: Medication: oxyCODONE  (OXY IR/ROXICODONE ) 5 MG immediate release tablet  Has the patient contacted their pharmacy? Yes (Agent: If no, request that the patient contact the pharmacy for the refill. If patient does not wish to contact the pharmacy document the reason why and proceed with request.) (Agent: If yes, when and what did the pharmacy advise?)  This is the patient's preferred pharmacy:  [facility] [address], [place] - [address] [place] [address] Phone: [phone] Fax: [phone]  Is this the correct pharmacy for this prescription? Yes If no, delete pharmacy and type the correct one.   Has the prescription been filled recently? No  Is the patient out of the medication? Yes  Has the patient been seen for an appointment in the last year OR does the patient have an upcoming appointment? Yes  Can we respond through MyChart? Yes  Agent: Please be advised that Rx refills may take up to 3 business days. We ask that you follow-up with your pharmacy.

## 2024-08-27 NOTE — Telephone Encounter (Signed)
 Provider has stated Denied.  Oxycodone  was prescribed by Dr. Vernetta.  Thank you  Tried to inform pt no answer

## 2024-08-27 NOTE — Telephone Encounter (Signed)
 He will need to be seen to get more medication

## 2024-08-30 ENCOUNTER — Ambulatory Visit (INDEPENDENT_AMBULATORY_CARE_PROVIDER_SITE_OTHER)

## 2024-08-30 ENCOUNTER — Encounter: Payer: Self-pay | Admitting: Podiatry

## 2024-08-30 ENCOUNTER — Ambulatory Visit: Admitting: Podiatry

## 2024-08-30 DIAGNOSIS — M722 Plantar fascial fibromatosis: Secondary | ICD-10-CM | POA: Diagnosis not present

## 2024-08-30 MED ORDER — HYDROCODONE-ACETAMINOPHEN 10-325 MG PO TABS: 1.0000 | ORAL_TABLET | Freq: Three times a day (TID) | ORAL | 0 refills | Status: AC | PRN

## 2024-08-30 MED ORDER — TRIAMCINOLONE ACETONIDE 10 MG/ML IJ SUSP
10.0000 mg | Freq: Once | INTRAMUSCULAR | Status: AC
  Administered 2024-08-30: 10 mg via INTRA_ARTICULAR

## 2024-09-01 NOTE — Progress Notes (Signed)
 Subjective:   Patient ID: Chad Spears, male   DOB: 43 y.o.   MRN: [id number]   HPI The patient states that he seems to be getting gradually better but he is on his feet all day at work and that he will well.  It seems better the arch could be inflamed.  Due to have his other 1 done in 2 weeks   ROS      Objective:  Physical Exam  Neurovascular status intact with diminishment of discomfort within the heel from surgery but quite a bit of pain in the right arch which may be more inflammatory with the left heel being very tender     Assessment:  Doing well overall with surgery but having pain in the arch right with continued severe discomfort plantar heel left     Plan:  H&P reviewed both condition and for the arch right I did go ahead and reviewed x-ray and then did sterile prep and injected the arch 3 mg dexamethasone  Kenalog  5 mg Xylocaine  he will continue taking pain medication but we are going to try to switch him to tramadol and he will have his other surgery done in the next 2 weeks.  X-rays indicate no loss of arch height no indication of bony pathology associated with this condition

## 2024-09-06 DIAGNOSIS — Z0279 Encounter for issue of other medical certificate: Secondary | ICD-10-CM

## 2024-09-13 ENCOUNTER — Other Ambulatory Visit: Payer: Self-pay | Admitting: Internal Medicine

## 2024-09-21 ENCOUNTER — Ambulatory Visit: Payer: Self-pay

## 2024-09-21 NOTE — Telephone Encounter (Signed)
 FYI Only or Action Required?: FYI only for provider: appointment scheduled on 11/19.  Patient was last seen in primary care on 08/18/2024 by Plotnikov, Karlynn GAILS, MD.  Called Nurse Triage reporting Palpitations.  Symptoms began several days ago.  Interventions attempted: Rest, hydration, or home remedies.  Symptoms are: completely resolved.  Triage Disposition: Home Care  Patient/caregiver understands and will follow disposition?: Yes, will follow disposition  Copied from CRM [id number]. Topic: Clinical - Red Word Triage >> Sep 21, 2024  2:37 PM Lauren C wrote: Red Word that prompted transfer to Nurse Triage: fast heart rate and out of breath intermittently. Wants appt with [facility] Dr. Garald. Concerned it may be from a medication he is on. Reason for Disposition  Palpitations  Answer Assessment - Initial Assessment Questions 1. DESCRIPTION: Please describe your heart rate or heartbeat that you are having (e.g., fast/slow, regular/irregular, skipped or extra beats, palpitations)     States was fast about 3 days ago, states he does not have it now 2. ONSET: When did it start? (e.g., minutes, hours, days)      Lasted 5 minutes, about 3 days 3. DURATION: How long does it last (e.g., seconds, minutes, hours)     5 mins 4. PATTERN Does it come and go, or has it been constant since it started?  Does it get worse with exertion?   Are you feeling it now?     Just the once 6. HEART RATE: Can you tell me your heart rate? How many beats in 15 seconds?  Note: Not all patients can do this.       Pt is unsure, states really fast 8. CAUSE: What do you think is causing the palpitations?     Medication, states he does not have the fast HR when he doesn't take the medication, states the med is for weightloss 10. OTHER SYMPTOMS: Do you have any other symptoms? (e.g., dizziness, chest pain, sweating, difficulty breathing)       Had diaphoresis at the time  Requests  to see PCP only.  Protocols used: Heart Rate and Heartbeat Questions-A-AH

## 2024-09-29 ENCOUNTER — Ambulatory Visit: Admitting: Internal Medicine

## 2024-09-29 ENCOUNTER — Encounter: Payer: Self-pay | Admitting: Internal Medicine

## 2024-09-29 VITALS — BP 114/72 | HR 88 | Temp 98.2°F | Ht 72.0 in | Wt 244.6 lb

## 2024-09-29 DIAGNOSIS — M722 Plantar fascial fibromatosis: Secondary | ICD-10-CM | POA: Diagnosis not present

## 2024-09-29 DIAGNOSIS — Z6833 Body mass index (BMI) 33.0-33.9, adult: Secondary | ICD-10-CM

## 2024-09-29 DIAGNOSIS — R002 Palpitations: Secondary | ICD-10-CM | POA: Diagnosis not present

## 2024-09-29 DIAGNOSIS — E66811 Obesity, class 1: Secondary | ICD-10-CM | POA: Diagnosis not present

## 2024-09-29 DIAGNOSIS — N528 Other male erectile dysfunction: Secondary | ICD-10-CM

## 2024-09-29 LAB — CBC WITH DIFFERENTIAL/PLATELET
Basophils Absolute: 0.1 K/uL (ref 0.0–0.1)
Basophils Relative: 1 % (ref 0.0–3.0)
Eosinophils Absolute: 0.4 K/uL (ref 0.0–0.7)
Eosinophils Relative: 5.1 % — ABNORMAL HIGH (ref 0.0–5.0)
HCT: 38.2 % — ABNORMAL LOW (ref 39.0–52.0)
Hemoglobin: 12.3 g/dL — ABNORMAL LOW (ref 13.0–17.0)
Lymphocytes Relative: 35.9 % (ref 12.0–46.0)
Lymphs Abs: 3.1 K/uL (ref 0.7–4.0)
MCHC: 32.2 g/dL (ref 30.0–36.0)
MCV: 71.1 fl — ABNORMAL LOW (ref 78.0–100.0)
Monocytes Absolute: 0.8 K/uL (ref 0.1–1.0)
Monocytes Relative: 8.8 % (ref 3.0–12.0)
Neutro Abs: 4.3 K/uL (ref 1.4–7.7)
Neutrophils Relative %: 49.2 % (ref 43.0–77.0)
Platelets: 419 K/uL — ABNORMAL HIGH (ref 150.0–400.0)
RBC: 5.37 Mil/uL (ref 4.22–5.81)
RDW: 17.9 % — ABNORMAL HIGH (ref 11.5–15.5)
WBC: 8.7 K/uL (ref 4.0–10.5)

## 2024-09-29 LAB — COMPREHENSIVE METABOLIC PANEL WITH GFR
ALT: 30 U/L (ref 0–53)
AST: 27 U/L (ref 0–37)
Albumin: 4.3 g/dL (ref 3.5–5.2)
Alkaline Phosphatase: 65 U/L (ref 39–117)
BUN: 10 mg/dL (ref 6–23)
CO2: 30 meq/L (ref 19–32)
Calcium: 9.3 mg/dL (ref 8.4–10.5)
Chloride: 104 meq/L (ref 96–112)
Creatinine, Ser: 0.83 mg/dL (ref 0.40–1.50)
GFR: 105.46 mL/min (ref >60.00)
Glucose, Bld: 90 mg/dL (ref 70–99)
Potassium: 3.6 meq/L (ref 3.5–5.1)
Sodium: 140 meq/L (ref 135–145)
Total Bilirubin: 0.6 mg/dL (ref 0.2–1.2)
Total Protein: 7 g/dL (ref 6.0–8.3)

## 2024-09-29 LAB — TESTOSTERONE: Testosterone: 408.64 ng/dL (ref 300.00–890.00)

## 2024-09-29 MED ORDER — HYDROXYZINE HCL 25 MG PO TABS: 25.0000 mg | ORAL_TABLET | Freq: Three times a day (TID) | ORAL | 3 refills | Status: AC | PRN

## 2024-09-29 MED ORDER — VITAMIN D (ERGOCALCIFEROL) 1.25 MG (50000 UNIT) PO CAPS: ORAL_CAPSULE | ORAL | 3 refills | Status: AC

## 2024-09-29 MED ORDER — PREDNISONE 10 MG PO TABS: ORAL_TABLET | ORAL | 0 refills | Status: AC

## 2024-09-29 NOTE — Assessment & Plan Note (Signed)
 Dr Magdalen - pt had surgery R foot

## 2024-09-29 NOTE — Assessment & Plan Note (Signed)
 Resolved off Phentermine 

## 2024-09-29 NOTE — Patient Instructions (Signed)
 [facility] offers a weight loss program that combines prescription GLP-1 medications with personalized coaching, a connected scale, and other tools to support lifestyle changes. The program includes an initial online visit, potential at-home metabolic testing, and ongoing support from a provider and health coach. The program has a monthly membership fee ($45 for the first month, then $145) and costs for the medication are separate and vary depending on the specific drug and insurance coverage.   How it works Initial visit: You start with an online visit to discuss your health history and goals with a healthcare provider.  Testing: An at-home metabolic test is often required to determine eligibility for a prescription.  Provider consultation: A video visit with your provider will review the test results and discuss if a prescription is appropriate.  Medication: If eligible, [facility] can help you get a prescription for FDA-approved medications like Wegovy or Zepbound, which are GLP-1 medications that help you feel fuller and reduce hunger.  Ongoing support: The membership includes a connected scale, check-ins with your provider, unlimited messaging, and 1:1 health coaching.  What is included [facility] membership: This covers the care and coaching aspects of the program. It costs $45 for the first month and then $145 per month.  Medication: The cost of the GLP-1 medication is separate from the membership fee. The price varies depending on the medication and your insurance coverage.  Insurance support: [facility] offers a free insurance check and handles the paperwork to help you get coverage for your medication.  Medication details GLP-1 medications: These medications can help with weight loss by slowing stomach emptying, increasing feelings of fullness, and reducing hunger.  FDA-approved options: [facility] provides access to FDA-approved medications specifically for weight loss, such as Wegovy (semaglutide) and Zepbound  (tirzepatide). Providers may also prescribe other GLP-1s off-label if they deem it appropriate.  Expected results: Clinical trials show significant weight loss is possible with these medications when combined with lifestyle changes.

## 2024-09-29 NOTE — Progress Notes (Signed)
 Subjective:  Patient ID: Chad Spears, male    DOB: [birth year]  Age: 43 y.o. MRN: [id number]  CC: Tachycardia (Tachycardia for the last 3 days. Each episode lasting 5 minutes)   HPI Chad Spears presents for tachycardia (Tachycardia for the last 3 days. Each episode lasting 5 minutes, Plantar fascitis - he had surgery Asking about RO for wt loss C/o ED  Outpatient Medications Prior to Visit  Medication Sig Dispense Refill   clotrimazole -betamethasone  (LOTRISONE ) cream Apply topically 2 (two) times daily. Use for groin rash 90 g 1   diclofenac  (VOLTAREN ) 75 MG EC tablet Take 1 tablet (75 mg total) by mouth 2 (two) times daily. 50 tablet 2   Emollient (EQ THERAPEUTIC DRY SKIN) CREA APPLY TOPICALLY AS NEEDED FOR DRY SKIN     loratadine  (CLARITIN ) 10 MG tablet Take 1 tablet (10 mg total) by mouth daily. 90 tablet 3   methocarbamol  (ROBAXIN ) 500 MG tablet Take 1 tablet at bedtime as needed for back spasm/back painTake 1 tablet at bedtime as needed for back spasm/back pain 90 tablet 1   methylPREDNISolone  (MEDROL  DOSEPAK) 4 MG TBPK tablet Take by mouth See admin instructions. 21 tablet 0   ondansetron  (ZOFRAN ) 4 MG tablet TAKE 1 TABLET BY MOUTH EVERY 8 HOURS AS NEEDED FOR NAUSEA FOR VOMITING 20 tablet 0   oxyCODONE  (OXY IR/ROXICODONE ) 5 MG immediate release tablet Take 1 tablet (5 mg total) by mouth every 6 (six) hours as needed for moderate pain (pain score 4-6) or severe pain (pain score 7-10). 20 tablet 0   Skin Protectants, Misc. (EUCERIN) cream Apply topically as needed for dry skin. 454 g 0   triamcinolone  cream (KENALOG ) 0.1 % Apply 1 Application topically 3 (three) times daily. On skin rash 360 g 3   hydrOXYzine  (ATARAX ) 25 MG tablet TAKE 1 TABLET BY MOUTH EVERY 8 HOURS AS NEEDED 90 tablet 1   phentermine  (ADIPEX-P ) 37.5 MG tablet Take 1 tablet (37.5 mg total) by mouth daily before breakfast. 90 tablet 0   predniSONE  (DELTASONE ) 10 MG tablet 12 day tapering dose 48 tablet 0   tadalafil   (CIALIS ) 20 MG tablet Take 1 tablet (20 mg total) by mouth every other day as needed for erectile dysfunction. 12 tablet 5   Vitamin D , Ergocalciferol , (DRISDOL ) 1.25 MG (50000 UNIT) CAPS capsule TAKE 1 CAPSULE BY MOUTH ONCE EVERY MONTH 3 capsule 3   No facility-administered medications prior to visit.    ROS: Review of Systems  Constitutional:  Positive for fatigue and unexpected weight change. Negative for appetite change.  HENT:  Negative for congestion, nosebleeds, sneezing, sore throat and trouble swallowing.   Eyes:  Negative for itching and visual disturbance.  Respiratory:  Negative for cough.   Cardiovascular:  Negative for chest pain, palpitations and leg swelling.  Gastrointestinal:  Negative for abdominal distention, blood in stool, diarrhea and nausea.  Genitourinary:  Negative for frequency and hematuria.  Musculoskeletal:  Positive for arthralgias. Negative for back pain, gait problem, joint swelling and neck pain.  Skin:  Negative for rash.  Neurological:  Negative for dizziness, tremors, speech difficulty and weakness.  Psychiatric/Behavioral:  Negative for agitation, dysphoric mood, sleep disturbance and suicidal ideas. The patient is not nervous/anxious.     Objective:  BP 114/72   Pulse 88   Temp 98.2 F (36.8 C)   Ht 6' (1.829 m)   Wt 244 lb 9.6 oz (110.9 kg)   SpO2 98%   BMI 33.17 kg/m   BP  Readings from Last 3 Encounters:  09/29/24 114/72  08/18/24 (!) 154/96  08/04/24 117/78    Wt Readings from Last 3 Encounters:  09/29/24 244 lb 9.6 oz (110.9 kg)  08/18/24 248 lb (112.5 kg)  08/04/24 239 lb (108.4 kg)    Physical Exam Constitutional:      General: He is not in acute distress.    Appearance: He is well-developed. He is obese.     Comments: NAD  Eyes:     Conjunctiva/sclera: Conjunctivae normal.     Pupils: Pupils are equal, round, and reactive to light.  Neck:     Thyroid : No thyromegaly.     Vascular: No JVD.  Cardiovascular:     Rate  and Rhythm: Normal rate and regular rhythm.     Heart sounds: Normal heart sounds. No murmur heard.    No friction rub. No gallop.  Pulmonary:     Effort: Pulmonary effort is normal. No respiratory distress.     Breath sounds: Normal breath sounds. No wheezing or rales.  Chest:     Chest wall: No tenderness.  Abdominal:     General: Bowel sounds are normal. There is no distension.     Palpations: Abdomen is soft. There is no mass.     Tenderness: There is no abdominal tenderness. There is no guarding or rebound.  Musculoskeletal:        General: No tenderness. Normal range of motion.     Cervical back: Normal range of motion.  Lymphadenopathy:     Cervical: No cervical adenopathy.  Skin:    General: Skin is warm and dry.     Findings: No rash.  Neurological:     Mental Status: He is alert and oriented to person, place, and time.     Cranial Nerves: No cranial nerve deficit.     Motor: No abnormal muscle tone.     Coordination: Coordination normal.     Gait: Gait normal.     Deep Tendon Reflexes: Reflexes are normal and symmetric.  Psychiatric:        Behavior: Behavior normal.        Thought Content: Thought content normal.        Judgment: Judgment normal.    R foot is in a boot   Lab Results  Component Value Date   WBC 8.3 04/22/2024   HGB 12.9 (L) 04/22/2024   HCT 40.1 04/22/2024   PLT 334.0 04/22/2024   GLUCOSE 115 (H) 08/18/2024   CHOL 158 03/20/2023   TRIG 52.0 03/20/2023   HDL 56.30 03/20/2023   LDLCALC 91 03/20/2023   ALT 22 08/18/2024   AST 17 08/18/2024   NA 139 08/18/2024   K 4.3 08/18/2024   CL 105 08/18/2024   CREATININE 0.88 08/18/2024   BUN 12 08/18/2024   CO2 27 08/18/2024   TSH 2.63 04/22/2024   PSA 0.51 04/22/2024   INR 1.08 08/23/2010   HGBA1C 6.0 08/18/2024    ECHOCARDIOGRAM COMPLETE Result Date: 08/20/2024    ECHOCARDIOGRAM REPORT   Patient Name:   Chad Spears   Date of Exam: 08/20/2024 Medical Rec #:  [id number]     Height:        72.0 in Accession #:    [id number]    Weight:       248.0 lb Date of Birth:  Dec 26, 1980     BSA:          2.335 m Patient Age:    43 years  BP:           154/96 mmHg Patient Gender: M             HR:           80 bpm. Exam Location:  [address] Procedure: 2D Echo, 3D Echo, Cardiac Doppler and Color Doppler (Both Spectral            and Color Flow Doppler were utilized during procedure). Indications:    R06.09 Dyspnea  History:        Patient has no prior history of Echocardiogram examinations.                 Signs/Symptoms:Dyspnea and Edema. 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  Sonographer:    Heather Hawks RDCS Referring Phys: Sedalia Greeson V Shawndale Kilpatrick IMPRESSIONS  1. Left ventricular ejection fraction, by estimation, is 65 to 70%. Left ventricular ejection fraction by 3D volume is 68 %. The left ventricle has normal function. The left ventricle has no regional wall motion abnormalities. There is mild left ventricular hypertrophy. Left ventricular diastolic parameters were normal.  2. Right ventricular systolic function is normal. The right ventricular size is normal.  3. The mitral valve is normal in structure. Mild mitral valve regurgitation. No evidence of mitral stenosis.  4. The aortic valve is normal in structure. Aortic valve regurgitation is mild. No aortic stenosis is present.  5. The inferior vena cava is normal in size with greater than 50% respiratory variability, suggesting right atrial pressure of 3 mmHg. FINDINGS  Left Ventricle: Left ventricular ejection fraction, by estimation, is 65 to 70%. Left ventricular ejection fraction by 3D volume is 68 %. The left ventricle has normal function. The left ventricle has no regional wall motion abnormalities. The left ventricular internal cavity size was normal in size. There is mild left ventricular hypertrophy. Left ventricular diastolic parameters were normal. Right Ventricle: The right ventricular size is  normal. No increase in right ventricular wall thickness. Right ventricular systolic function is normal. Left Atrium: Left atrial size was normal in size. Right Atrium: Right atrial size was normal in size. Pericardium: There is no evidence of pericardial effusion. Mitral Valve: The mitral valve is normal in structure. Mild mitral valve regurgitation. No evidence of mitral valve stenosis. Tricuspid Valve: The tricuspid valve is normal in structure. Tricuspid valve regurgitation is not demonstrated. No evidence of tricuspid stenosis. Aortic Valve: The aortic valve is normal in structure. Aortic valve regurgitation is mild. Aortic regurgitation PHT measures 688 msec. No aortic stenosis is present. Pulmonic Valve: The pulmonic valve was normal in structure. Pulmonic valve regurgitation is mild. No evidence of pulmonic stenosis. Aorta: The aortic root is normal in size and structure. Venous: The inferior vena cava is normal in size with greater than 50% respiratory variability, suggesting right atrial pressure of 3 mmHg. IAS/Shunts: No atrial level shunt detected by color flow Doppler. Additional Comments: 3D was performed not requiring image post processing on an independent workstation and was normal.  LEFT VENTRICLE PLAX 2D LVIDd:         4.40 cm         Diastology LVIDs:         3.05 cm         LV e' medial:    8.59 cm/s LV PW:         1.20 cm  LV E/e' medial:  10.5 LV IVS:        1.10 cm         LV e' lateral:   17.00 cm/s LVOT diam:     2.20 cm         LV E/e' lateral: 5.3 LV SV:         85 LV SV Index:   36 LVOT Area:     3.80 cm        3D Volume EF LV IVRT:       85 msec         LV 3D EF:    Left                                             ventricul                                             ar                                             ejection                                             fraction                                             by 3D                                             volume is                                              68 %.                                 3D Volume EF:                                3D EF:        68 %                                LV EDV:       131 ml                                LV ESV:       42 ml  LV SV:        89 ml RIGHT VENTRICLE RV Basal diam:  4.30 cm  PULMONARY VEINS RV Mid diam:    3.40 cm  Diastolic Velocity: 46.10 cm/s                          S/D Velocity:       0.90                          Systolic Velocity:  42.40 cm/s LEFT ATRIUM             Index        RIGHT ATRIUM           Index LA diam:        4.00 cm 1.71 cm/m   RA Pressure: 3.00 mmHg LA Vol (A2C):   65.3 ml 27.97 ml/m  RA Area:     13.00 cm LA Vol (A4C):   42.2 ml 18.08 ml/m  RA Volume:   28.25 ml  12.10 ml/m LA Biplane Vol: 53.3 ml 22.83 ml/m  AORTIC VALVE LVOT Vmax:   122.00 cm/s LVOT Vmean:  76.600 cm/s LVOT VTI:    0.223 m AI PHT:      688 msec  AORTA Ao Root diam: 3.30 cm Ao Asc diam:  3.40 cm MITRAL VALVE               TRICUSPID VALVE MV Area (PHT): cm         Estimated RAP:  3.00 mmHg MV Decel Time: 201 msec MR Peak grad: 113.2 mmHg   SHUNTS MR Mean grad: 73.0 mmHg    Systemic VTI:  0.22 m MR Vmax:      532.00 cm/s  Systemic Diam: 2.20 cm MR Vmean:     398.0 cm/s MV E velocity: 90.10 cm/s MV A velocity: 80.05 cm/s MV E/A ratio:  1.13 Oneil Parchment MD Electronically signed by Oneil Parchment MD Signature Date/Time: 08/20/2024/1:35:51 PM    Final     Assessment & Plan:   Problem List Items Addressed This Visit     Erectile dysfunction   Not better Revatio  prn - discontinued Tadalafil  20 mg as needed did not help Caverject discussed Urology ref      Relevant Orders   Ambulatory referral to Urology   Comprehensive metabolic panel with GFR   CBC with Differential/Platelet   Testosterone    Obesity (BMI 30.0-34.9)   Palpitations resolved off Phentermine  RO program info was printed Diet, exercise      Palpitations - Primary   Resolved off  Phentermine       Plantar fasciitis   Dr Magdalen - pt had surgery R foot      Relevant Orders   Comprehensive metabolic panel with GFR      Meds ordered this encounter  Medications   hydrOXYzine  (ATARAX ) 25 MG tablet    Sig: Take 1 tablet (25 mg total) by mouth every 8 (eight) hours as needed.    Dispense:  90 tablet    Refill:  3   predniSONE  (DELTASONE ) 10 MG tablet    Sig: 12 day tapering dose    Dispense:  48 tablet    Refill:  0   Vitamin D , Ergocalciferol , (DRISDOL ) 1.25 MG (50000 UNIT) CAPS capsule    Sig: TAKE 1 CAPSULE BY MOUTH ONCE EVERY MONTH    Dispense:  3 capsule    Refill:  3      Follow-up: Return in about 3 months (around 12/30/2024) for a follow-up visit.  Marolyn Noel, MD

## 2024-09-29 NOTE — Assessment & Plan Note (Signed)
 Not better Revatio  prn - discontinued Tadalafil  20 mg as needed did not help Caverject discussed Urology ref

## 2024-09-29 NOTE — Assessment & Plan Note (Signed)
 Palpitations resolved off Phentermine  RO program info was printed Diet, exercise

## 2024-09-30 ENCOUNTER — Other Ambulatory Visit: Payer: Self-pay | Admitting: Internal Medicine

## 2024-09-30 NOTE — Telephone Encounter (Signed)
 Copied from CRM #[id number]. Topic: Clinical - Medication Refill >> Sep 30, 2024  1:52 PM Mercedes MATSU wrote: Medication: oxyCODONE  (OXY IR/ROXICODONE ) 5 MG immediate release tablet  Has the patient contacted their pharmacy? Yes (Agent: If no, request that the patient contact the pharmacy for the refill. If patient does not wish to contact the pharmacy document the reason why and proceed with request.) (Agent: If yes, when and what did the pharmacy advise?)  This is the patient's preferred pharmacy:  [facility] [address] [place] - [address] [place] [address] Phone: [phone] Fax: [phone]  Is this the correct pharmacy for this prescription? Yes If no, delete pharmacy and type the correct one.   Has the prescription been filled recently? Yes  Is the patient out of the medication? Yes  Has the patient been seen for an appointment in the last year OR does the patient have an upcoming appointment? Yes  Can we respond through MyChart? Yes  Agent: Please be advised that Rx refills may take up to 3 business days. We ask that you follow-up with your pharmacy.

## 2024-10-04 ENCOUNTER — Ambulatory Visit: Payer: Self-pay | Admitting: Internal Medicine

## 2024-10-05 ENCOUNTER — Telehealth: Payer: Self-pay

## 2024-10-05 NOTE — Telephone Encounter (Signed)
 Copied from CRM #[id number]. Topic: Clinical - Prescription Issue >> Oct 05, 2024  1:54 PM Nessti S wrote: Reason for CRM: pt called about refill for oxyCODONE  (OXY IR/ROXICODONE ) 5 MG immediate release tablet. He was not able to pick up from pharmacy. Would like a call back [phone]

## 2024-10-06 ENCOUNTER — Telehealth: Payer: Self-pay | Admitting: Lab

## 2024-10-06 NOTE — Telephone Encounter (Signed)
 Tried to reach pt and inform him of PCP statement as follows He does not need this medication. This medication (oxycodone ) can be addictive.  Thanks  Please inform pt of above message upon his call back to the clinic.

## 2024-10-06 NOTE — Telephone Encounter (Signed)
 He does not need this medication. This medication (oxycodone ) can be addictive.  Thanks

## 2024-10-06 NOTE — Telephone Encounter (Signed)
 Patient is calling for refill request on pain medication.

## 2024-10-11 NOTE — Telephone Encounter (Unsigned)
 Copied from CRM [id number]. Topic: Clinical - Medication Question >> Oct 11, 2024  1:45 PM Thersia C wrote: Reason for CRM: Patient called in regarding his prescription oxyCODONE  (OXY IR/ROXICODONE ) 5 MG immediate release tablet , did relay message per note but he stated it is for pain.

## 2024-10-11 NOTE — Telephone Encounter (Signed)
 Tried to reach pt and inform him of PCP statement as follows He does not need this medication. This medication (oxycodone ) can be addictive.  Thanks  Please inform pt of above message upon his call back to the clinic.

## 2024-10-11 NOTE — Telephone Encounter (Signed)
 PCP has stated  Tried to reach pt and inform him of PCP statement as follows He does not need this medication. This medication (oxycodone ) can be addictive.  Thanks   Please inform pt of above message upon his call back to the clinic.

## 2024-10-12 ENCOUNTER — Telehealth: Payer: Self-pay

## 2024-10-12 NOTE — Telephone Encounter (Signed)
 I am sorry.  I will not be refilling his Oxycodone  postoperative prescription.  Thank you

## 2024-10-12 NOTE — Telephone Encounter (Signed)
 Copied from CRM [id number]. Topic: Clinical - Medication Question >> Oct 11, 2024  1:45 PM Thersia C wrote: Reason for CRM: Patient called in regarding his prescription oxyCODONE  (OXY IR/ROXICODONE ) 5 MG immediate release tablet , did relay message per note but he stated it is for pain. >> Oct 12, 2024  1:28 PM Rosina D wrote: Patient called wanting an update on his medication refill. Patient stated he was given this medication in the surgery department and he was told to reach out to his doctor for another refill   [phone]

## 2024-10-13 ENCOUNTER — Telehealth: Payer: Self-pay

## 2024-10-13 NOTE — Telephone Encounter (Signed)
 PCP is NOT refilling this medication. Please have pt make an apptmnt to be seen in regards to request.

## 2024-10-13 NOTE — Telephone Encounter (Signed)
 PCP has stated the following I am sorry. I will not be refilling his Oxycodone  postoperative prescription. Thank you  Please inform pt of the above upon pts call back to the clinic.

## 2024-10-13 NOTE — Telephone Encounter (Signed)
 Copied from CRM [id number]. Topic: Clinical - Medication Question >> Oct 11, 2024  1:45 PM Thersia C wrote: Reason for CRM: Patient called in regarding his prescription oxyCODONE  (OXY IR/ROXICODONE ) 5 MG immediate release tablet , did relay message per note but he stated it is for pain. >> Oct 13, 2024 11:19 AM Franky GRADE wrote: Patient is calling to follow up on the refill request for oxyCODONE  (OXY IR/ROXICODONE ) 5 MG immediate release tablet, I informed patient of Dr.Plotnikov's encounter message. He will follow up with Dr.Blackman who is the provider on the prescription.  >> Oct 12, 2024  1:28 PM Rosina D wrote: Patient called wanting an update on his medication refill. Patient stated he was given this medication in the surgery department and he was told to reach out to his doctor for another refill   [phone]

## 2024-10-15 ENCOUNTER — Other Ambulatory Visit: Payer: Self-pay | Admitting: Podiatry

## 2024-10-15 MED ORDER — HYDROCODONE-ACETAMINOPHEN 10-325 MG PO TABS: 1.0000 | ORAL_TABLET | Freq: Three times a day (TID) | ORAL | 0 refills | Status: AC | PRN

## 2024-10-15 NOTE — Telephone Encounter (Signed)
 Sent in

## 2024-11-01 ENCOUNTER — Ambulatory Visit

## 2024-11-01 ENCOUNTER — Encounter: Payer: Self-pay | Admitting: Podiatry

## 2024-11-01 ENCOUNTER — Ambulatory Visit: Admitting: Podiatry

## 2024-11-01 DIAGNOSIS — S9031XA Contusion of right foot, initial encounter: Secondary | ICD-10-CM | POA: Diagnosis not present

## 2024-11-01 MED ORDER — HYDROCODONE-ACETAMINOPHEN 10-325 MG PO TABS: 1.0000 | ORAL_TABLET | Freq: Three times a day (TID) | ORAL | 0 refills | Status: AC | PRN

## 2024-11-03 NOTE — Progress Notes (Signed)
 Subjective:   Patient ID: Chad Spears, male   DOB: 43 y.o.   MRN: [id number]   HPI Patient presents stating that he twisted his right foot and his arch has started to hurt and his foot in general and it is especially bad at nighttime   ROS      Objective:  Physical Exam  Neurovascular status intact negative Toula' sign noted doing very well from having surgery right heel needs left foot faxed and has inflammation pain mid arch top of foot from twisting injury that occurred     Assessment:  Trauma to the right foot with inflammation pain     Plan:  H&P precautionary x-ray taken and advised on anti-inflammatories and ice therapy and I did place him on hydrocodone  just to take at night due to pain after work.  All questions answered  X-rays indicate that there is no signs of fracture arch height remains stable

## 2024-11-08 ENCOUNTER — Ambulatory Visit: Admitting: Internal Medicine

## 2024-11-09 ENCOUNTER — Telehealth: Payer: Self-pay | Admitting: Podiatry

## 2024-11-09 NOTE — Telephone Encounter (Signed)
 DOS- 11/16/2024  ENDOSCOPIC PLANTAR FASCIOTOMY LT- 70106  [employer] AR EFFECTIVE DATE- 11/11/2024  DEDUCTIBLE- $3500 REMAINING- $3500  OOP- $6850 REMAINING- $6850 COINSURANCE- 0%  PER AVAILITY PORTAL, PRIOR AUTH IS NOT REQUIRED FOR CPT CODE 70106.

## 2024-11-10 ENCOUNTER — Telehealth: Payer: Self-pay | Admitting: Lab

## 2024-11-10 NOTE — Telephone Encounter (Signed)
Requesting refill on pain medication. 

## 2024-11-10 NOTE — Telephone Encounter (Signed)
 Not at this time.

## 2024-11-15 ENCOUNTER — Telehealth: Payer: Self-pay | Admitting: Lab

## 2024-11-15 ENCOUNTER — Other Ambulatory Visit: Payer: Self-pay | Admitting: Podiatry

## 2024-11-15 MED ORDER — HYDROCODONE-ACETAMINOPHEN 5-325 MG PO TABS: 1.0000 | ORAL_TABLET | Freq: Four times a day (QID) | ORAL | 0 refills | Status: AC | PRN

## 2024-11-15 NOTE — Telephone Encounter (Signed)
 Patient calling for medication refill if unable to refill can you prescribe something to help with pain experiencing severe pain had to leave work due to pain please advise.

## 2024-11-18 ENCOUNTER — Ambulatory Visit: Admitting: Internal Medicine

## 2024-11-22 ENCOUNTER — Encounter

## 2024-12-01 ENCOUNTER — Telehealth: Payer: Self-pay | Admitting: Internal Medicine

## 2024-12-01 ENCOUNTER — Telehealth: Payer: Self-pay

## 2024-12-01 NOTE — Telephone Encounter (Signed)
 Copied from CRM #[id number]. Topic: Clinical - Medication Refill >> Dec 01, 2024  1:12 PM Hadassah PARAS wrote: Medication: methocarbamol  (ROBAXIN ) 500 MG tablet   Has the patient contacted their pharmacy? Yes (Agent: If no, request that the patient contact the pharmacy for the refill. If patient does not wish to contact the pharmacy document the reason why and proceed with request.) (Agent: If yes, when and what did the pharmacy advise?)  This is the patient's preferred pharmacy:  [facility] [address] [place] - [address] [place] [address] Phone: [phone] Fax: [phone]  Is this the correct pharmacy for this prescription? Yes If no, delete pharmacy and type the correct one.   Has the prescription been filled recently? No  Is the patient out of the medication? Yes  Has the patient been seen for an appointment in the last year OR does the patient have an upcoming appointment? Yes  Can we respond through MyChart? Yes  Agent: Please be advised that Rx refills may take up to 3 business days. We ask that you follow-up with your pharmacy.

## 2024-12-01 NOTE — Telephone Encounter (Signed)
 Reason for CRM: Pt is applying for short term and long term disability. Pt needs authrozation or paperwork stating PT does not have medical problems or on medications. Please advise pt on how to proceed

## 2024-12-01 NOTE — Telephone Encounter (Signed)
 Copied from CRM #[id number]. Topic: General - Other >> Dec 01, 2024  1:17 PM Hadassah PARAS wrote: Reason for CRM: Pt is applying for short term and long term disability. Pt needs authrozation or paperwork stating PT does not have medical problems or on medications. Please advise pt on how to proceed on #[phone]

## 2024-12-02 NOTE — Telephone Encounter (Unsigned)
 Copied from CRM #[id number]. Topic: General - Other >> Dec 02, 2024 12:20 PM Alfonso HERO wrote: Reason for CRM: patient calling for an update on previous message. Asking for a call back.

## 2024-12-03 ENCOUNTER — Other Ambulatory Visit: Payer: Self-pay | Admitting: Internal Medicine

## 2024-12-03 MED ORDER — METHOCARBAMOL 500 MG PO TABS: ORAL_TABLET | ORAL | 1 refills | Status: AC

## 2024-12-03 NOTE — Telephone Encounter (Signed)
 Okay.  Done.  Thanks

## 2024-12-03 NOTE — Telephone Encounter (Signed)
 Okay.  Thanks.

## 2024-12-06 ENCOUNTER — Encounter

## 2024-12-10 ENCOUNTER — Ambulatory Visit: Admitting: Internal Medicine

## 2024-12-10 VITALS — BP 122/84 | HR 78 | Temp 98.1°F | Ht 72.0 in | Wt 263.0 lb

## 2024-12-10 DIAGNOSIS — R635 Abnormal weight gain: Secondary | ICD-10-CM | POA: Diagnosis not present

## 2024-12-10 DIAGNOSIS — R21 Rash and other nonspecific skin eruption: Secondary | ICD-10-CM | POA: Diagnosis not present

## 2024-12-10 DIAGNOSIS — E559 Vitamin D deficiency, unspecified: Secondary | ICD-10-CM | POA: Diagnosis not present

## 2024-12-10 DIAGNOSIS — G8929 Other chronic pain: Secondary | ICD-10-CM | POA: Diagnosis not present

## 2024-12-10 DIAGNOSIS — M545 Low back pain, unspecified: Secondary | ICD-10-CM | POA: Diagnosis not present

## 2024-12-10 MED ORDER — TIRZEPATIDE-WEIGHT MANAGEMENT 2.5 MG/0.5ML ~~LOC~~ SOLN: 2.5000 mg | SUBCUTANEOUS | 5 refills | Status: AC

## 2024-12-10 NOTE — Patient Instructions (Signed)
 [employer] has recently reduced the self-pay cash prices for Zepbound  single-dose vials purchased through the [employer] online pharmacy   The new prices for a four-week supply (four single-dose vials) for self-paying patients are:   2.5 mg dose: $299 per month (previously $349) 5 mg dose: $399 per month (previously $499) 7.5 mg, 10 mg, 12.5 mg, and 15 mg doses: $449 per month (previously $499) -----------------------------------------------------------------------------------------------------------------------------------------  [employer] [facility] pills Cash Pricing & Options (2026):  $149 per month: For the 1.5 mg and 4 mg doses (available through February 23, 2025). $199 per month: For the 4 mg dose starting February 24, 2025. $299 per month: For 9 mg and 25 mg doses. Provider Programs: Companies like [employer] and [employer] offer these cash prices directly to consumers, sometimes with additional telehealth fees. Savings Restrictions: These self-pay offers are generally not available to patients with government insurance like Medicare or Medicaid.  The Wegovy pill (taken daily) has a lower out-of-pocket, self-pay price compared to the [facility] injection (taken weekly), which costs $349+ per month for self-pay consumers.    Heres the typical dosing schedule for the [facility] pill:   Starting dosage: 1.5 mg once daily for the first four weeks. This starter dose helps your body get used to the medication and sets the stage for higher doses later on.  Step-up dosages: Every four weeks, the dose increased -- first to 4 mg, then 9 mg, and finally 25 mg. In clinical trials, most people reached the full dose after about three months of gradual increases.  Maintenance dosage: 25 mg once daily. This was the main dose used for ongoing weight loss in trials and is the maintenance dose approved by the [place]  Food and Drug Administration (FDA). If someone couldnt tolerate 25 mg in the trials, they stayed on a lower dose and tried  increasing again later with medical guidance.  Maximum dosage: 25 mg once daily. This is the highest dose approved by the FDA. After about 64 weeks, people taking 25 mg daily lost an average of 13.6% of their body weight.  Some studies explored even hi

## 2024-12-10 NOTE — Assessment & Plan Note (Signed)
Options discussed 

## 2024-12-10 NOTE — Progress Notes (Unsigned)
 "  Subjective:  Patient ID: Chad Spears, male    DOB: 03-Feb-1981  Age: 44 y.o. MRN: [id number]  CC: No chief complaint on file.   HPI Chad Spears presents for weight gain, obesity, vitamin D  deficiency, back pain His rash seems to be better  Outpatient Medications Prior to Visit  Medication Sig Dispense Refill   clotrimazole -betamethasone  (LOTRISONE ) cream Apply topically 2 (two) times daily. Use for groin rash 90 g 1   diclofenac  (VOLTAREN ) 75 MG EC tablet Take 1 tablet (75 mg total) by mouth 2 (two) times daily. 50 tablet 2   Emollient (EQ THERAPEUTIC DRY SKIN) CREA APPLY TOPICALLY AS NEEDED FOR DRY SKIN     HYDROcodone -acetaminophen  (NORCO/VICODIN) 5-325 MG tablet Take 1 tablet by mouth every 6 (six) hours as needed. 30 tablet 0   hydrOXYzine  (ATARAX ) 25 MG tablet Take 1 tablet (25 mg total) by mouth every 8 (eight) hours as needed. 90 tablet 3   loratadine  (CLARITIN ) 10 MG tablet Take 1 tablet (10 mg total) by mouth daily. 90 tablet 3   methocarbamol  (ROBAXIN ) 500 MG tablet Take 1 tablet at bedtime as needed for back spasm/back painTake 1 tablet at bedtime as needed for back spasm/back pain 90 tablet 1   methylPREDNISolone  (MEDROL  DOSEPAK) 4 MG TBPK tablet Take by mouth See admin instructions. 21 tablet 0   ondansetron  (ZOFRAN ) 4 MG tablet TAKE 1 TABLET BY MOUTH EVERY 8 HOURS AS NEEDED FOR NAUSEA FOR VOMITING 20 tablet 0   oxyCODONE  (OXY IR/ROXICODONE ) 5 MG immediate release tablet Take 1 tablet (5 mg total) by mouth every 6 (six) hours as needed for moderate pain (pain score 4-6) or severe pain (pain score 7-10). 20 tablet 0   predniSONE  (DELTASONE ) 10 MG tablet 12 day tapering dose 48 tablet 0   Skin Protectants, Misc. (EUCERIN) cream Apply topically as needed for dry skin. 454 g 0   tadalafil  (CIALIS ) 20 MG tablet Take 20 mg by mouth every other day as needed.     triamcinolone  cream (KENALOG ) 0.1 % Apply 1 Application topically 3 (three) times daily. On skin rash 360 g 3   Vitamin D ,  Ergocalciferol , (DRISDOL ) 1.25 MG (50000 UNIT) CAPS capsule TAKE 1 CAPSULE BY MOUTH ONCE EVERY MONTH 3 capsule 3   No facility-administered medications prior to visit.    ROS: Review of Systems  Constitutional:  Positive for unexpected weight change. Negative for appetite change and fatigue.  HENT:  Negative for congestion, nosebleeds, sneezing, sore throat and trouble swallowing.   Eyes:  Negative for itching and visual disturbance.  Respiratory:  Negative for cough.   Cardiovascular:  Negative for chest pain, palpitations and leg swelling.  Gastrointestinal:  Negative for abdominal distention, blood in stool, diarrhea and nausea.  Genitourinary:  Negative for frequency and hematuria.  Musculoskeletal:  Positive for back pain. Negative for gait problem, joint swelling and neck pain.  Skin:  Negative for rash.  Neurological:  Negative for dizziness, tremors, speech difficulty and weakness.  Psychiatric/Behavioral:  Negative for agitation, dysphoric mood and sleep disturbance. The patient is not nervous/anxious.     Objective:  BP 122/84   Pulse 78   Temp 98.1 F (36.7 C) (Oral)   Ht 6' (1.829 m)   Wt 263 lb (119.3 kg)   SpO2 98%   BMI 35.67 kg/m   BP Readings from Last 3 Encounters:  12/10/24 122/84  09/29/24 114/72  08/18/24 (!) 154/96    Wt Readings from Last 3 Encounters:  12/10/24 263 lb (119.3 kg)  09/29/24 244 lb 9.6 oz (110.9 kg)  08/18/24 248 lb (112.5 kg)    Physical Exam Constitutional:      General: He is not in acute distress.    Appearance: He is well-developed. He is obese.     Comments: NAD  Eyes:     Conjunctiva/sclera: Conjunctivae normal.     Pupils: Pupils are equal, round, and reactive to light.  Neck:     Thyroid : No thyromegaly.     Vascular: No JVD.  Cardiovascular:     Rate and Rhythm: Normal rate and regular rhythm.     Heart sounds: Normal heart sounds. No murmur heard.    No friction rub. No gallop.  Pulmonary:     Effort:  Pulmonary effort is normal. No respiratory distress.     Breath sounds: Normal breath sounds. No wheezing or rales.  Chest:     Chest wall: No tenderness.  Abdominal:     General: Bowel sounds are normal. There is no distension.     Palpations: Abdomen is soft. There is no mass.     Tenderness: There is no abdominal tenderness. There is no guarding or rebound.  Musculoskeletal:        General: No tenderness. Normal range of motion.     Cervical back: Normal range of motion.     Right lower leg: No edema.     Left lower leg: No edema.  Lymphadenopathy:     Cervical: No cervical adenopathy.  Skin:    General: Skin is warm and dry.     Findings: No rash.  Neurological:     Mental Status: He is alert and oriented to person, place, and time.     Cranial Nerves: No cranial nerve deficit.     Motor: No abnormal muscle tone.     Coordination: Coordination normal.     Gait: Gait normal.     Deep Tendon Reflexes: Reflexes are normal and symmetric.  Psychiatric:        Behavior: Behavior normal.        Thought Content: Thought content normal.        Judgment: Judgment normal.     Lab Results  Component Value Date   WBC 8.7 09/29/2024   HGB 12.3 (L) 09/29/2024   HCT 38.2 (L) 09/29/2024   PLT 419.0 (H) 09/29/2024   GLUCOSE 90 09/29/2024   CHOL 158 03/20/2023   TRIG 52.0 03/20/2023   HDL 56.30 03/20/2023   LDLCALC 91 03/20/2023   ALT 30 09/29/2024   AST 27 09/29/2024   NA 140 09/29/2024   K 3.6 09/29/2024   CL 104 09/29/2024   CREATININE 0.83 09/29/2024   BUN 10 09/29/2024   CO2 30 09/29/2024   TSH 2.63 04/22/2024   PSA 0.51 04/22/2024   INR 1.08 08/23/2010   HGBA1C 6.0 08/18/2024    ECHOCARDIOGRAM COMPLETE Result Date: 08/20/2024    ECHOCARDIOGRAM REPORT   Patient Name:   Chad Spears   Date of Exam: 08/20/2024 Medical Rec #:  [id number]     Height:       72.0 in Accession #:    [id number]    Weight:       248.0 lb Date of Birth:  1981-05-04     BSA:          2.335 m  Patient Age:    43 years      BP:  154/96 mmHg Patient Gender: M             HR:           80 bpm. Exam Location:  [address] Procedure: 2D Echo, 3D Echo, Cardiac Doppler and Color Doppler (Both Spectral            and Color Flow Doppler were utilized during procedure). Indications:    R06.09 Dyspnea  History:        Patient has no prior history of Echocardiogram examinations.                 Signs/Symptoms:Dyspnea and Edema. 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  Sonographer:    Heather Hawks RDCS Referring Phys: Danelle Curiale V Joniel Graumann IMPRESSIONS  1. Left ventricular ejection fraction, by estimation, is 65 to 70%. Left ventricular ejection fraction by 3D volume is 68 %. The left ventricle has normal function. The left ventricle has no regional wall motion abnormalities. There is mild left ventricular hypertrophy. Left ventricular diastolic parameters were normal.  2. Right ventricular systolic function is normal. The right ventricular size is normal.  3. The mitral valve is normal in structure. Mild mitral valve regurgitation. No evidence of mitral stenosis.  4. The aortic valve is normal in structure. Aortic valve regurgitation is mild. No aortic stenosis is present.  5. The inferior vena cava is normal in size with greater than 50% respiratory variability, suggesting right atrial pressure of 3 mmHg. FINDINGS  Left Ventricle: Left ventricular ejection fraction, by estimation, is 65 to 70%. Left ventricular ejection fraction by 3D volume is 68 %. The left ventricle has normal function. The left ventricle has no regional wall motion abnormalities. The left ventricular internal cavity size was normal in size. There is mild left ventricular hypertrophy. Left ventricular diastolic parameters were normal. Right Ventricle: The right ventricular size is normal. No increase in right ventricular wall thickness. Right ventricular systolic function is normal. Left Atrium:  Left atrial size was normal in size. Right Atrium: Right atrial size was normal in size. Pericardium: There is no evidence of pericardial effusion. Mitral Valve: The mitral valve is normal in structure. Mild mitral valve regurgitation. No evidence of mitral valve stenosis. Tricuspid Valve: The tricuspid valve is normal in structure. Tricuspid valve regurgitation is not demonstrated. No evidence of tricuspid stenosis. Aortic Valve: The aortic valve is normal in structure. Aortic valve regurgitation is mild. Aortic regurgitation PHT measures 688 msec. No aortic stenosis is present. Pulmonic Valve: The pulmonic valve was normal in structure. Pulmonic valve regurgitation is mild. No evidence of pulmonic stenosis. Aorta: The aortic root is normal in size and structure. Venous: The inferior vena cava is normal in size with greater than 50% respiratory variability, suggesting right atrial pressure of 3 mmHg. IAS/Shunts: No atrial level shunt detected by color flow Doppler. Additional Comments: 3D was performed not requiring image post processing on an independent workstation and was normal.  LEFT VENTRICLE PLAX 2D LVIDd:         4.40 cm         Diastology LVIDs:         3.05 cm         LV e' medial:    8.59 cm/s LV PW:         1.20 cm         LV E/e' medial:  10.5 LV  IVS:        1.10 cm         LV e' lateral:   17.00 cm/s LVOT diam:     2.20 cm         LV E/e' lateral: 5.3 LV SV:         85 LV SV Index:   36 LVOT Area:     3.80 cm        3D Volume EF LV IVRT:       85 msec         LV 3D EF:    Left                                             ventricul                                             ar                                             ejection                                             fraction                                             by 3D                                             volume is                                             68 %.                                 3D Volume EF:                                 3D EF:        68 %                                LV EDV:       131 ml                                LV ESV:       42 ml  LV SV:        89 ml RIGHT VENTRICLE RV Basal diam:  4.30 cm  PULMONARY VEINS RV Mid diam:    3.40 cm  Diastolic Velocity: 46.10 cm/s                          S/D Velocity:       0.90                          Systolic Velocity:  42.40 cm/s LEFT ATRIUM             Index        RIGHT ATRIUM           Index LA diam:        4.00 cm 1.71 cm/m   RA Pressure: 3.00 mmHg LA Vol (A2C):   65.3 ml 27.97 ml/m  RA Area:     13.00 cm LA Vol (A4C):   42.2 ml 18.08 ml/m  RA Volume:   28.25 ml  12.10 ml/m LA Biplane Vol: 53.3 ml 22.83 ml/m  AORTIC VALVE LVOT Vmax:   122.00 cm/s LVOT Vmean:  76.600 cm/s LVOT VTI:    0.223 m AI PHT:      688 msec  AORTA Ao Root diam: 3.30 cm Ao Asc diam:  3.40 cm MITRAL VALVE               TRICUSPID VALVE MV Area (PHT): cm         Estimated RAP:  3.00 mmHg MV Decel Time: 201 msec MR Peak grad: 113.2 mmHg   SHUNTS MR Mean grad: 73.0 mmHg    Systemic VTI:  0.22 m MR Vmax:      532.00 cm/s  Systemic Diam: 2.20 cm MR Vmean:     398.0 cm/s MV E velocity: 90.10 cm/s MV A velocity: 80.05 cm/s MV E/A ratio:  1.13 Oneil Parchment MD Electronically signed by Oneil Parchment MD Signature Date/Time: 08/20/2024/1:35:51 PM    Final     Assessment & Plan:   Problem List Items Addressed This Visit     BACK PAIN, LUMBAR   Overall doing well.  Weight loss would be useful      Weight gain - Primary   Options discussed BMI>35 One phentermine  periodically  Potential benefits of a long term adipex use as well as potential risks  and complications were explained to the patient and were aknowledged. Khylen is interested in using GLP-1 injection or tablets.  We discussed pricing, oral versus injectables, benefits versus side effects She will think about what he wants to use.  I gave him prescriptions for [facility] tablets and for Zepbound  injections       Vitamin D  deficiency   Continue to use vitamin D       Skin rash   Overall better         Meds ordered this encounter  Medications   tirzepatide  (ZEPBOUND ) 2.5 MG/0.5ML injection vial    Sig: Inject 2.5 mg into the skin once a week.    Dispense:  2 mL    Refill:  5      Follow-up: No follow-ups on file.  Marolyn Noel, MD "

## 2024-12-13 ENCOUNTER — Encounter: Payer: Self-pay | Admitting: Internal Medicine

## 2024-12-13 NOTE — Assessment & Plan Note (Signed)
 Continue to use vitamin D

## 2024-12-13 NOTE — Assessment & Plan Note (Signed)
Overall better
